# Patient Record
Sex: Female | Born: 1960 | Hispanic: No | Marital: Married | Smoking: Former smoker
Health system: Southern US, Community
[De-identification: ages and names within clinical notes are randomized; demographics above are authoritative.]

## PROBLEM LIST (undated history)

## (undated) ENCOUNTER — Ambulatory Visit

## (undated) DIAGNOSIS — F329 Major depressive disorder, single episode, unspecified: Secondary | ICD-10-CM

## (undated) DIAGNOSIS — E119 Type 2 diabetes mellitus without complications: Secondary | ICD-10-CM

## (undated) DIAGNOSIS — I219 Acute myocardial infarction, unspecified: Secondary | ICD-10-CM

## (undated) DIAGNOSIS — F419 Anxiety disorder, unspecified: Secondary | ICD-10-CM

## (undated) DIAGNOSIS — R011 Cardiac murmur, unspecified: Secondary | ICD-10-CM

## (undated) DIAGNOSIS — J45909 Unspecified asthma, uncomplicated: Secondary | ICD-10-CM

## (undated) DIAGNOSIS — Z87442 Personal history of urinary calculi: Secondary | ICD-10-CM

## (undated) DIAGNOSIS — I10 Essential (primary) hypertension: Secondary | ICD-10-CM

## (undated) DIAGNOSIS — K219 Gastro-esophageal reflux disease without esophagitis: Secondary | ICD-10-CM

## (undated) DIAGNOSIS — M1990 Unspecified osteoarthritis, unspecified site: Secondary | ICD-10-CM

## (undated) DIAGNOSIS — I350 Nonrheumatic aortic (valve) stenosis: Secondary | ICD-10-CM

## (undated) DIAGNOSIS — F32A Depression, unspecified: Secondary | ICD-10-CM

## (undated) DIAGNOSIS — J189 Pneumonia, unspecified organism: Secondary | ICD-10-CM

## (undated) DIAGNOSIS — R569 Unspecified convulsions: Secondary | ICD-10-CM

## (undated) HISTORY — PX: CHOLECYSTECTOMY: SHX55

---

## 1985-04-27 HISTORY — PX: TUBAL LIGATION: SHX77

## 2000-09-02 ENCOUNTER — Other Ambulatory Visit: Admission: RE | Admit: 2000-09-02 | Discharge: 2000-09-02 | Payer: Self-pay | Admitting: Family Medicine

## 2012-04-27 DIAGNOSIS — J189 Pneumonia, unspecified organism: Secondary | ICD-10-CM

## 2012-04-27 HISTORY — DX: Pneumonia, unspecified organism: J18.9

## 2013-04-27 DIAGNOSIS — I219 Acute myocardial infarction, unspecified: Secondary | ICD-10-CM

## 2013-04-27 HISTORY — DX: Acute myocardial infarction, unspecified: I21.9

## 2013-12-25 ENCOUNTER — Ambulatory Visit: Payer: Self-pay | Admitting: Family Medicine

## 2016-05-26 DIAGNOSIS — G629 Polyneuropathy, unspecified: Secondary | ICD-10-CM | POA: Insufficient documentation

## 2017-01-25 DIAGNOSIS — K5790 Diverticulosis of intestine, part unspecified, without perforation or abscess without bleeding: Secondary | ICD-10-CM | POA: Insufficient documentation

## 2017-01-25 DIAGNOSIS — H409 Unspecified glaucoma: Secondary | ICD-10-CM | POA: Insufficient documentation

## 2017-09-13 ENCOUNTER — Encounter
Admission: RE | Admit: 2017-09-13 | Discharge: 2017-09-13 | Disposition: A | Discharge status: Home / Self Care | Payer: Medicaid Other | Source: Ambulatory Visit | Attending: General Surgery | Admitting: General Surgery

## 2017-09-13 ENCOUNTER — Other Ambulatory Visit: Payer: Self-pay

## 2017-09-13 ENCOUNTER — Ambulatory Visit: Payer: Self-pay | Admitting: General Surgery

## 2017-09-13 DIAGNOSIS — I10 Essential (primary) hypertension: Secondary | ICD-10-CM | POA: Insufficient documentation

## 2017-09-13 DIAGNOSIS — R9431 Abnormal electrocardiogram [ECG] [EKG]: Secondary | ICD-10-CM | POA: Diagnosis not present

## 2017-09-13 DIAGNOSIS — Z01810 Encounter for preprocedural cardiovascular examination: Secondary | ICD-10-CM | POA: Insufficient documentation

## 2017-09-13 HISTORY — DX: Gastro-esophageal reflux disease without esophagitis: K21.9

## 2017-09-13 HISTORY — DX: Personal history of urinary calculi: Z87.442

## 2017-09-13 HISTORY — DX: Cardiac murmur, unspecified: R01.1

## 2017-09-13 HISTORY — DX: Essential (primary) hypertension: I10

## 2017-09-13 HISTORY — DX: Unspecified osteoarthritis, unspecified site: M19.90

## 2017-09-13 HISTORY — DX: Depression, unspecified: F32.A

## 2017-09-13 HISTORY — DX: Anxiety disorder, unspecified: F41.9

## 2017-09-13 HISTORY — DX: Unspecified convulsions: R56.9

## 2017-09-13 HISTORY — DX: Major depressive disorder, single episode, unspecified: F32.9

## 2017-09-13 HISTORY — DX: Acute myocardial infarction, unspecified: I21.9

## 2017-09-13 HISTORY — DX: Type 2 diabetes mellitus without complications: E11.9

## 2017-09-13 HISTORY — DX: Unspecified asthma, uncomplicated: J45.909

## 2017-09-13 HISTORY — DX: Pneumonia, unspecified organism: J18.9

## 2017-09-13 NOTE — H&P (Signed)
PATIENT PROFILE: Nancy [name] is a 57 y.o. female who presents to the Clinic for consultation at the request of [facility] for evaluation of hemorrhoids.  PCP: [facility], [facility], MD  HISTORY OF PRESENT ILLNESS: Nancy [name] reports having hemorrhoid since 1979. She said that in the last two years the have been more symptomatic. Patient complain of sever pain on peri anal area. Pain does not radiates to other part of the body. Pain is aggravated when sitting down and with bowel movement. Pain is improved with preparation H. Patient refers constipation. Constipation sometimes improved with glycerin suppository. Refers associated bleeding, itching, burning, and stool leakage. Patient refers last colonoscopy was 5 years ago with findings of diverticulosis as per patient. Next one expected to be 10 years after last one.   PROBLEM LIST: Problem List Date Reviewed: 04/01/2016  Noted  Hyperlipidemia, mixed 01/29/2016  Type 2 diabetes mellitus without complication, with long-term current use of insulin (CMS-HCC) 01/29/2016  Aortic stenosis, moderate 11/08/2015  Evidence of prior myocardial infarction on electrocardiogram 11/01/2015  Hypercholesteremia 10/31/2015  Overview  2017: Tchol 181, LDL 94, HDL 58   Essential hypertension 10/23/2015  Skin infection 10/23/2015  Situational anxiety 10/23/2015  Colon cancer screening 08/07/2013  Retinal hemorrhage, right eye 05/24/2013  Heart murmur - aortic stenosis Unknown  Overview  ECHO 2014: normal LV function, AS gradients of 40 and 23.    Chest pain, unspecified 10/14/2012  DM2 (diabetes mellitus, type 2) (CMS-HCC) Unknown  Glaucoma Unknown  Diverticulosis Unknown  Myocardial infarction (CMS-HCC) Unknown    GENERAL REVIEW OF SYSTEMS:   General ROS: negative for - chills, fatigue, fever, weight gain or weight loss Allergy and Immunology ROS: negative for - hives  Hematological and Lymphatic ROS: negative for - bleeding problems or  bruising, negative for palpable nodes Endocrine ROS: negative for - heat or cold intolerance, hair changes Respiratory ROS: negative for - cough, shortness of breath or wheezing Cardiovascular ROS: no chest pain or palpitations GI ROS: negative for nausea, vomiting, abdominal pain, diarrhea, Positive constipation Musculoskeletal ROS: positive for - joint swelling or muscle pain Neurological ROS: negative for - confusion, syncope Dermatological ROS: negative for pruritus and rash Psychiatric: negative for anxiety, depression, difficulty sleeping and memory loss  MEDICATIONS: Current Outpatient Medications  Medication Sig Dispense Refill  . amLODIPine (NORVASC) 5 MG tablet Take 5 mg by mouth once daily  . aspirin 81 MG EC tablet Take 81 mg by mouth once daily. Reported on 08/19/2015  . calcium carbonate-vitamin D3 (CALCIUM-VITAMIN D) 500 mg(1,250mg ) -400 unit tablet Take 1 tablet by mouth 2 (two) times daily with meals  . empagliflozin (JARDIANCE) 25 mg Tab Take 25 mg by mouth once daily. 90 tablet 3  . insulin GLARGINE (LANTUS SOLOSTAR, BASAGLAR KWIKPEN) pen injector (concentration 100 units/mL) Inject 60 Units subcutaneously 2 (two) times daily. (Patient taking differently: Inject 48 Units subcutaneously once daily ) 108 mL 3  . LACTOBACILLUS ACIDOPHILUS (PROBIOTIC ORAL) Take 1 tablet by mouth once daily  . losartan (COZAAR) 100 MG tablet Take 1 tablet (100 mg total) by mouth once daily. 90 tablet 3  . metoprolol tartrate (LOPRESSOR) 50 MG tablet Take 50 mg by mouth once daily  . multivitamin with minerals (HAIR,SKIN AND NAILS) tablet Take 2 tablets by mouth once daily.  . BD ULTRA-FINE NANO PEN NEEDLES 32 gauge x 5/32" Ndle USE AS DIRECTED WITH LEVEMIR FLEXTOUCH 270 each 3  . blood glucose diagnostic (ONETOUCH ULTRA TEST) test strip Use 3 (three) times daily. DX:  E11.9 300 each 3   No current facility-administered medications for this visit.   ALLERGIES: Liraglutide; Metformin;  Lisinopril; Ibuprofen; Levemir [insulin detemir]; and Statins-hmg-coa reductase inhibitors  PAST MEDICAL HISTORY: Past Medical History:  Diagnosis Date  . Allergic state  I buphine  . 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 Cataract cortical, senile 10/30/2014  . Diverticulosis  has had diverticulitis previously  . DM2 (diabetes mellitus, type 2) (CMS-HCC)  . Former smoker  quit 1987  . Glaucoma  . Heart murmur, unspecified  . HTN (hypertension)  . Hypercholesteremia 10/31/2015  . Myocardial infarction (CMS-HCC)  age 59 ("mild heart attack" per patient) in [place]   PAST SURGICAL HISTORY: Past Surgical History:  Procedure Laterality Date  . CHOLECYSTECTOMY  . COLONOSCOPY 08/08/2013  Procedure: COLORECTAL CANCER SCREENING; COLONOSCOPY ON INDIVIDUAL AT HIGH RISK; Surgeon: Benjie Karvonen, MD; Location: [facility]; Service: Gastroenterology;;  . TUBAL LIGATION    FAMILY HISTORY: Family History  Problem Relation Age of Onset  . Myocardial Infarction (Heart attack) Mother  . Cirrhosis Mother  . Alcohol abuse Mother  . Coronary Artery Disease (Blocked arteries around heart) Mother  . Glaucoma Mother  . High blood pressure (Hypertension) Father  . Aneurysm Sister  . Diabetes type II Brother  . Coronary Artery Disease (Blocked arteries around heart) Brother  . Diabetes type II Sister  . High blood pressure (Hypertension) Sister  . High blood pressure (Hypertension) Sister  . Diabetes type II Sister  . Diabetes type II Sister  . High blood pressure (Hypertension) Sister  . Stroke Maternal Grandfather  . Stroke Paternal Grandmother  . Stroke Paternal Grandfather  . Coronary Artery Disease (Blocked arteries around heart) Brother  . Diabetes type II Brother  . High blood pressure (Hypertension) Brother  . Diabetes type II Sister  . High blood pressure (Hypertension) Sister  . Obesity Sister  . Diabetes type II Sister  . Diabetes type II Sister  . Stroke  Maternal Grandmother  . Thyroid disease Daughter    SOCIAL HISTORY: Social History   Socioeconomic History  . Marital status: Married  Spouse name: Not on file  . Number of children: Not on file  . Years of education: Not on file  . Highest education level: Not on file  Occupational History  . Not on file  Social Needs  . Financial resource strain: Not on file  . Food insecurity:  Worry: Not on file  Inability: Not on file  . Transportation needs:  Medical: Not on file  Non-medical: Not on file  Tobacco Use  . Smoking status: Former Smoker  Packs/day: 1.00  Years: 11.00  Pack years: 11.00  Types: Cigarettes  Last attempt to quit: 05/07/1985  Years since quitting: 32.3  . Smokeless tobacco: Never Used  Substance and Sexual Activity  . Alcohol use: No  Alcohol/week: 0.0 oz  Comment: 1 drink per month  . Drug use: Yes  Frequency: 7.0 times per week  Types: Marijuana  Comment: for control of glocoma  . Sexual activity: Yes  Partners: Male  Birth control/protection: Spermicide, Surgical, None  Other Topics Concern  . Would you please tell us about the people who live in your home, your pets, or anything else important to your social life? No  Social History Narrative  Married in 1978. Four kids, five grandkids. 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 Exercise: will work on. Wears seatbelt, sunscreen. Dentist: yes. Calcium in diet: yes. Religious beliefs affecting  health care: no.   Advanced Directives: husband knows her wishes.   PHYSICAL EXAM: Vitals:  09/10/17 0801  BP: 138/89  Pulse: 70  Temp: 36.1 C (96.9 F)   Body mass index is 31.8 kg/m. Weight: 83.4 kg (183 lb 12.8 oz)   GENERAL: Alert, active, oriented x3  HEENT: Pupils equal reactive to light. Extraocular movements are intact. Sclera clear. Palpebral conjunctiva normal red color.Pharynx clear.  NECK: Supple with no palpable mass and no adenopathy.  LUNGS: Sound clear with no rales rhonchi or  wheezes.  HEART: Regular rhythm S1 and S2 without murmur.  ABDOMEN: Soft and depressible, nontender with no palpable mass, no hepatomegaly.  RECTAL: adequate rectal tone. No masses, palpable hemorrhoids. No fissures. See anoscopy report.   EXTREMITIES: Well-developed well-nourished symmetrical with no dependent edema.  NEUROLOGICAL: Awake alert oriented, facial expression symmetrical, moving all extremities.  REVIEW OF DATA: I have reviewed the following data today: No visits with results within 3 Month(s) from this visit.  Latest known visit with results is:  Office Visit on 01/29/2016  Component Date Value  . Auto Result 01/29/2016 Yes  . Hemoglobin A1C 01/29/2016 8.6*  . Average Blood Glucose (C* 01/29/2016 207   Old record from last visit at the [facility] was reviewed.   ASSESSMENT: Ms. Prum is a 57 y.o. female presenting for consultation for Hemorrhoids.   Patient was found on physical exam with finding of internal and external hemorrhoids. Patient oriented about the diagnosis of hemorrhoids, its function and anatomy. The different treatment alternatives were discussed (conservative management, office procedures and surgical treatment). Patient was oriented of why it should be started with conservative management (fiber supplement, water intake). Patient was oriented that the hemorrhoids are a matter of quality of life. Patient refers that her quality of life right now is being affected significantly and she will like to proceed directly to surgical management since the pain and the bleeding are not letting her work around the house well. Patient oriented multiple times about the difficult recovery of the surgery, pain and other risk of complication such as: incontinence, stricture, fistulas, infection, bleeding, among others.   PLAN: 1. Internal and External Hemorrhoidectomy (37366) 2. CBC, CMP 3. Internal Medicine clearance 4. Stop taking the aspirin 5  days before surgery  Patient and her husband verbalized understanding, all questions were answered, and were agreeable with the plan outlined above.   Carolan Shiver, MD  Electronically signed by Carolan Shiver, MD

## 2017-09-13 NOTE — Pre-Procedure Instructions (Signed)
CALLED SHERRY AT DR CINTRONS OFFICE AND INFORMED HER OF PT NEEDING CLEARANCE-FAXED CLEARANCE TO DR CINTRONS OFFICE AND TO DR Emelda Brothers OFFICE

## 2017-09-13 NOTE — H&P (View-Only) (Signed)
PATIENT PROFILE: Nancy [name] is a 57 y.o. female who presents to the Clinic for consultation at the request of [facility] for evaluation of hemorrhoids.  PCP: [facility], [facility], MD  HISTORY OF PRESENT ILLNESS: Ms. Nancy [name] reports having hemorrhoid since 1979. She said that in the last two years the have been more symptomatic. Patient complain of sever pain on peri anal area. Pain does not radiates to other part of the body. Pain is aggravated when sitting down and with bowel movement. Pain is improved with preparation H. Patient refers constipation. Constipation sometimes improved with glycerin suppository. Refers associated bleeding, itching, burning, and stool leakage. Patient refers last colonoscopy was 5 years ago with findings of diverticulosis as per patient. Next one expected to be 10 years after last one.   PROBLEM LIST: Problem List Date Reviewed: 04/01/2016  Noted  Hyperlipidemia, mixed 01/29/2016  Type 2 diabetes mellitus without complication, with long-term current use of insulin (CMS-HCC) 01/29/2016  Aortic stenosis, moderate 11/08/2015  Evidence of prior myocardial infarction on electrocardiogram 11/01/2015  Hypercholesteremia 10/31/2015  Overview  2017: Tchol 181, LDL 94, HDL 58   Essential hypertension 10/23/2015  Skin infection 10/23/2015  Situational anxiety 10/23/2015  Colon cancer screening 08/07/2013  Retinal hemorrhage, right eye 05/24/2013  Heart murmur - aortic stenosis Unknown  Overview  ECHO 2014: normal LV function, AS gradients of 40 and 23.    Chest pain, unspecified 10/14/2012  DM2 (diabetes mellitus, type 2) (CMS-HCC) Unknown  Glaucoma Unknown  Diverticulosis Unknown  Myocardial infarction (CMS-HCC) Unknown    GENERAL REVIEW OF SYSTEMS:   General ROS: negative for - chills, fatigue, fever, weight gain or weight loss Allergy and Immunology ROS: negative for - hives  Hematological and Lymphatic ROS: negative for - bleeding problems or  bruising, negative for palpable nodes Endocrine ROS: negative for - heat or cold intolerance, hair changes Respiratory ROS: negative for - cough, shortness of breath or wheezing Cardiovascular ROS: no chest pain or palpitations GI ROS: negative for nausea, vomiting, abdominal pain, diarrhea, Positive constipation Musculoskeletal ROS: positive for - joint swelling or muscle pain Neurological ROS: negative for - confusion, syncope Dermatological ROS: negative for pruritus and rash Psychiatric: negative for anxiety, depression, difficulty sleeping and memory loss  MEDICATIONS: Current Outpatient Medications  Medication Sig Dispense Refill  . amLODIPine (NORVASC) 5 MG tablet Take 5 mg by mouth once daily  . aspirin 81 MG EC tablet Take 81 mg by mouth once daily. Reported on 08/19/2015  . calcium carbonate-vitamin D3 (CALCIUM-VITAMIN D) 500 mg(1,250mg) -400 unit tablet Take 1 tablet by mouth 2 (two) times daily with meals  . empagliflozin (JARDIANCE) 25 mg Tab Take 25 mg by mouth once daily. 90 tablet 3  . insulin GLARGINE (LANTUS SOLOSTAR, BASAGLAR KWIKPEN) pen injector (concentration 100 units/mL) Inject 60 Units subcutaneously 2 (two) times daily. (Patient taking differently: Inject 48 Units subcutaneously once daily ) 108 mL 3  . LACTOBACILLUS ACIDOPHILUS (PROBIOTIC ORAL) Take 1 tablet by mouth once daily  . losartan (COZAAR) 100 MG tablet Take 1 tablet (100 mg total) by mouth once daily. 90 tablet 3  . metoprolol tartrate (LOPRESSOR) 50 MG tablet Take 50 mg by mouth once daily  . multivitamin with minerals (HAIR,SKIN AND NAILS) tablet Take 2 tablets by mouth once daily.  . BD ULTRA-FINE NANO PEN NEEDLES 32 gauge x 5/32" Ndle USE AS DIRECTED WITH LEVEMIR FLEXTOUCH 270 each 3  . blood glucose diagnostic (ONETOUCH ULTRA TEST) test strip Use 3 (three) times daily. DX:   E11.9 300 each 3   No current facility-administered medications for this visit.   ALLERGIES: Liraglutide; Metformin;  Lisinopril; Ibuprofen; Levemir [insulin detemir]; and Statins-hmg-coa reductase inhibitors  PAST MEDICAL HISTORY: Past Medical History:  Diagnosis Date  . Allergic state  I buphine  . 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 Cataract cortical, senile 10/30/2014  . Diverticulosis  has had diverticulitis previously  . DM2 (diabetes mellitus, type 2) (CMS-HCC)  . Former smoker  quit 1987  . Glaucoma  . Heart murmur, unspecified  . HTN (hypertension)  . Hypercholesteremia 10/31/2015  . Myocardial infarction (CMS-HCC)  age 27 ("mild heart attack" per patient) in [place]   PAST SURGICAL HISTORY: Past Surgical History:  Procedure Laterality Date  . CHOLECYSTECTOMY  . COLONOSCOPY 08/08/2013  Procedure: COLORECTAL CANCER SCREENING; COLONOSCOPY ON INDIVIDUAL AT HIGH RISK; Surgeon: Joanne A P Wilson, MD; Location: [facility]; Service: Gastroenterology;;  . TUBAL LIGATION    FAMILY HISTORY: Family History  Problem Relation Age of Onset  . Myocardial Infarction (Heart attack) Mother  . Cirrhosis Mother  . Alcohol abuse Mother  . Coronary Artery Disease (Blocked arteries around heart) Mother  . Glaucoma Mother  . High blood pressure (Hypertension) Father  . Aneurysm Sister  . Diabetes type II Brother  . Coronary Artery Disease (Blocked arteries around heart) Brother  . Diabetes type II Sister  . High blood pressure (Hypertension) Sister  . High blood pressure (Hypertension) Sister  . Diabetes type II Sister  . Diabetes type II Sister  . High blood pressure (Hypertension) Sister  . Stroke Maternal Grandfather  . Stroke Paternal Grandmother  . Stroke Paternal Grandfather  . Coronary Artery Disease (Blocked arteries around heart) Brother  . Diabetes type II Brother  . High blood pressure (Hypertension) Brother  . Diabetes type II Sister  . High blood pressure (Hypertension) Sister  . Obesity Sister  . Diabetes type II Sister  . Diabetes type II Sister  . Stroke  Maternal Grandmother  . Thyroid disease Daughter    SOCIAL HISTORY: Social History   Socioeconomic History  . Marital status: Married  Spouse name: Not on file  . Number of children: Not on file  . Years of education: Not on file  . Highest education level: Not on file  Occupational History  . Not on file  Social Needs  . Financial resource strain: Not on file  . Food insecurity:  Worry: Not on file  Inability: Not on file  . Transportation needs:  Medical: Not on file  Non-medical: Not on file  Tobacco Use  . Smoking status: Former Smoker  Packs/day: 1.00  Years: 11.00  Pack years: 11.00  Types: Cigarettes  Last attempt to quit: 05/07/1985  Years since quitting: 32.3  . Smokeless tobacco: Never Used  Substance and Sexual Activity  . Alcohol use: No  Alcohol/week: 0.0 oz  Comment: 1 drink per month  . Drug use: Yes  Frequency: 7.0 times per week  Types: Marijuana  Comment: for control of glocoma  . Sexual activity: Yes  Partners: Male  Birth control/protection: Spermicide, Surgical, None  Other Topics Concern  . Would you please tell us about the people who live in your home, your pets, or anything else important to your social life? No  Social History Narrative  Married in 1978. Four kids, five grandkids. 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 Exercise: will work on. Wears seatbelt, sunscreen. Dentist: yes. Calcium in diet: yes. Religious beliefs affecting   health care: no.   Advanced Directives: husband knows her wishes.   PHYSICAL EXAM: Vitals:  09/10/17 0801  BP: 138/89  Pulse: 70  Temp: 36.1 C (96.9 F)   Body mass index is 31.8 kg/m. Weight: 83.4 kg (183 lb 12.8 oz)   GENERAL: Alert, active, oriented x3  HEENT: Pupils equal reactive to light. Extraocular movements are intact. Sclera clear. Palpebral conjunctiva normal red color.Pharynx clear.  NECK: Supple with no palpable mass and no adenopathy.  LUNGS: Sound clear with no rales rhonchi or  wheezes.  HEART: Regular rhythm S1 and S2 without murmur.  ABDOMEN: Soft and depressible, nontender with no palpable mass, no hepatomegaly.  RECTAL: adequate rectal tone. No masses, palpable hemorrhoids. No fissures. See anoscopy report.   EXTREMITIES: Well-developed well-nourished symmetrical with no dependent edema.  NEUROLOGICAL: Awake alert oriented, facial expression symmetrical, moving all extremities.  REVIEW OF DATA: I have reviewed the following data today: No visits with results within 3 Month(s) from this visit.  Latest known visit with results is:  Office Visit on 01/29/2016  Component Date Value  . Auto Result 01/29/2016 Yes  . Hemoglobin A1C 01/29/2016 8.6*  . Average Blood Glucose (C* 01/29/2016 207   Old record from last visit at the [facility] was reviewed.   ASSESSMENT: Ms. Behar is a 57 y.o. female presenting for consultation for Hemorrhoids.   Patient was found on physical exam with finding of internal and external hemorrhoids. Patient oriented about the diagnosis of hemorrhoids, its function and anatomy. The different treatment alternatives were discussed (conservative management, office procedures and surgical treatment). Patient was oriented of why it should be started with conservative management (fiber supplement, water intake). Patient was oriented that the hemorrhoids are a matter of quality of life. Patient refers that her quality of life right now is being affected significantly and she will like to proceed directly to surgical management since the pain and the bleeding are not letting her work around the house well. Patient oriented multiple times about the difficult recovery of the surgery, pain and other risk of complication such as: incontinence, stricture, fistulas, infection, bleeding, among others.   PLAN: 1. Internal and External Hemorrhoidectomy (46260) 2. CBC, CMP 3. Internal Medicine clearance 4. Stop taking the aspirin 5  days before surgery  Patient and her husband verbalized understanding, all questions were answered, and were agreeable with the plan outlined above.   Herman Mell Cintron-Diaz, MD  Electronically signed by Beckhem Isadore Cintron-Diaz, MD   

## 2017-09-13 NOTE — Pre-Procedure Instructions (Signed)
CALLED DR Maisie Fus REGARDING ABNORMAL EKG- DR Maisie Fus WANTS PTS CARDIOLOGIST TO CLEAR PT PRIOR TO SURGERY

## 2017-09-13 NOTE — Patient Instructions (Addendum)
Your procedure is scheduled on: 09-16-17 THURSDAY Report to Same Day Surgery 2nd floor medical mall [facility] Entrance-take elevator on left to 2nd floor.  Check in with surgery information desk.) To find out your arrival time please call [phone] between 1PM - 3PM on 09-15-17 [facility]  Remember: Instructions that are not followed completely may result in serious medical risk, up to and including death, or upon the discretion of your surgeon and anesthesiologist your surgery may need to be rescheduled.    _x___ 1. Do not eat food after midnight the night before your procedure. NO GUM OR CANDY AFTER MIDNIGHT.  You may drink WATER up to 2 hours before you are scheduled to arrive at the hospital for your procedure.  Do not drink WATER within 2 hours of your scheduled arrival to the hospital.  Type 1 and type 2 diabetics should only drink water.      __x__ 2. No Alcohol for 24 hours before or after surgery.   __x__3. No Smoking or e-cigarettes for 24 prior to surgery.  Do not use any chewable tobacco products for at least 6 hour prior to surgery   ____  4. Bring all medications with you on the day of surgery if instructed.    __x__ 5. Notify your doctor if there is any change in your medical condition     (cold, fever, infections).    x___6. On the morning of surgery brush your teeth with toothpaste and water.  You may rinse your mouth with mouth wash if you wish.  Do not swallow any toothpaste or mouthwash.   Do not wear jewelry, make-up, hairpins, clips or nail polish.  Do not wear lotions, powders, or perfumes. You may wear deodorant.  Do not shave 48 hours prior to surgery. Men may shave face and neck.  Do not bring valuables to the hospital.    [facility] is not responsible for any belongings or valuables.               Contacts, dentures or bridgework may not be worn into surgery.  Leave your suitcase in the car. After surgery it may be brought to your room.  For patients  admitted to the hospital, discharge time is determined by your treatment team.  _  Patients discharged the day of surgery will not be allowed to drive home.  You will need someone to drive you home and stay with you the night of your procedure.    Please read over the following fact sheets that you were given:   [facility] Preparing for Surgery and or MRSA Information   _x___ TAKE THE FOLLOWING MEDICATION THE MORNING OF SURGERY WITH A SMALL SIP OF WATER. These include:  1. AMLODIPINE  2. METOPROLOL  3.  4.  5.  6.  ____Fleets enema or Magnesium Citrate as directed.   ____ Use CHG Soap or sage wipes as directed on instruction sheet   _X___ Use inhalers on the day of surgery and bring to hospital day of surgery-USE YOUR ALBUTEROL INHALER AM OF SURGERY AND BRING TO HOSPITAL  ____ Stop Metformin and Janumet 2 days prior to surgery    _X___ Take 1/2 of usual insulin dose the night before surgery and none on the morning surgery-TAKE HALF OF YOUR LANTUS ON Wednesday NIGHT AND NO INSULIN AM OF SURGERY  _x___ Follow recommendations from Cardiologist, Pulmonologist or PCP regarding stopping Aspirin, Coumadin, Plavix ,Eliquis, Effient, or Pradaxa, and Pletal-PT STOPPED ASPIRIN ON Saturday, 09-11-17  PER DR CINTRON-DIAZ  X____Stop Anti-inflammatories such as Advil, Aleve, Ibuprofen, Motrin, Naproxen, Naprosyn, Goodies powders or aspirin products NOW-OK to take Tylenol #3 IF NEEDED   _x___ Stop supplements until after surgery-STOP CINNAMON, GARCINIA-CAMBOGIA, AND TURMERIC NOW-MAY RESUME AFTER SURGERY   ____ Bring C-Pap to the hospital.

## 2017-09-14 NOTE — Pre-Procedure Instructions (Addendum)
Spoke with amy at dr Emelda Brothers office informing her that I my fax is not going thru-she gave me the correct #. I told her that I was faxing this over and I gave Amy pt's name so she would be looking for fax.  Faxed clearance request with confirmation received

## 2017-09-15 NOTE — Pre-Procedure Instructions (Signed)
CALLED AND SPOKE WITH DR [facility] NURSE.  SHE STATES THEY DID RECEIVE THE CLEARANCE REQUEST.  SHE STATES DR Lucianne Muss HAS BEEN IN PROCEDURES ALL MORNING AND THAT SHE WILL TALK WITH HIM REGARDING THIS CLEARANCE. SHE STATES THAT DR Lucianne Muss WANTED PT LAST MAY TO HAVE AN ECHO DONE AND SHE CANCELLED THE APPT SO SHE THINKS THAT DR Lucianne Muss PROBABLY WILL NOT CLEAR PT. NURSE TO CALL ME BACK WHEN SHE GETS AN ANSWER FROM DR Lucianne Muss. I CALLED SHERRI AT DR CINTRONS OFFICE AND LEFT HER A MESSAGE REGARDING THIS.

## 2017-09-22 NOTE — Pre-Procedure Instructions (Signed)
Progress Notes - documented in this encounter  Eldred Manges, MD - 09/16/2017 8:30 AM EDT Formatting of this note might be different from the original.   [facility] Cardiology, [facility]  Date of Service: 09/16/2017  Return Clinic Note  PCP: Referring Provider:  Tommie Raymond, MD [address] [facility] [place] [address] Phone: [phone] Fax: [phone] Eldred Manges, MD [address] [place], [place] [address] Phone: [phone] Fax: [phone]   Assessment and Plan:   [name] is a 57 y.o. 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 Evie Lacks.  #1: Aortic stenosis She continues to remain asymptomatic from aortic stenosis point of view at this point of time with no history of exertional chest pain, shortness of breath or any dizziness or syncope. She was diagnosed to have aortic stenosis when she was evaluated for muscular pain in her chest due to sprain and was found to have a cardiac murmur. Echocardiographic evaluation done at that time showed moderate aortic stenosis with normal LV systolic function and moderate left ventricular hypertrophy.  During the last visit about a year back she was advised to get an echocardiographic evaluation as she has not had echocardiographic evaluation in the last 5 years or so. She is also being planned for surgery for hemorrhoids which will require general anesthesia and needs to get reevaluation of her cardiac condition done which is required at this time for risk stratify from noncardiac surgery point of view.  #2: Hypertension She's currently on amlodipine losartan and metoprolol succinate and with that her blood pressure is under good control. I did  not make any changes in her medications at this point.  #3: Hyperlipidemia Her total cholesterol in 2016 was 181 and LDL cholesterol was at 94 with HDL cholesterol of 58. She has been unable to tolerate statins in the past.  . No follow-ups on file. Orders Placed This Encounter  Procedures  . Echocardiogram W Colorflow Spectral Doppler  History of moderate stenosis, last echo more than 5 years back at [facility].  Standing Status: Future  Number of Occurrences: 1  Standing Expiration Date: 09/17/2018  Order Specific Question: Performed at  Answer: [facility]  Order Specific Question: Reason for Exam:  Answer: Aortic stenosis  Order Specific Question: Is the patient pregnant?  Answer: No   Subjective:   Reason for Visit: Aortic stenosis  History of Present Illness: Ms. Nancy [name] is a 57 y.o. 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 Evie Lacks. She denies any history of any exertional symptoms like shortness of breath on exertion or angina on exertion. She also denies any history of exertional syncope or any history of dizziness. She denies any history of palpitations. She denies any history of orthopnea or paroxysmal nocturnal dyspnea or any body swellings. She denies any history suggestive of any cerebrovascular event or any history of bleeding event.  Cardiovascular History and relevant Past Medical History: Hypertension, diabetes mellitus, moderate aortic stenosis due to degenerative aortic valve disease.  I have independently reviewed of all the diagnostic studies. I have reviewed the old medical records from [facility] prior to this  clinic visit.   Cardiovascular Studies Date Comments  ECG Sep 01, 2016 Normal sinus rhythm, nonspecific IVCD with QRS duration of 126 ms.  Echo July 2017([facility]) NORMAL LEFT  VENTRICULAR SYSTOLIC FUNCTION WITH MODERATE LVH NORMAL RIGHT VENTRICULAR SYSTOLIC FUNCTION VALVULAR REGURGITATION: TRIVIAL AR, TRIVIAL MR, TRIVIAL PR, TRIVIAL TR VALVULAR STENOSIS: MODERATE AS(peak velocity 3.8 m/s)  Stress test/imaging  Cardiac catheterization  CYP2C19 Genotype  Electrophysiology  Cardiovascular Surgery  Peripheral Vascular Studies  Past medical history was reviewed in EPIC.  Pertinent cardiac medications:  Current Outpatient Medications  Medication Sig Dispense Refill  . acetaminophen-codeine (TYLENOL #3) 300-30 mg per tablet TK 1 TO 2 TS PO HS PRN P 0  . albuterol (PROAIR HFA) 90 mcg/actuation inhaler Inhale 2 puffs every six (6) hours as needed for wheezing. 1 Inhaler 0  . amLODIPine (NORVASC) 10 MG tablet TK 1 T PO D FOR HIGH BP 5  . aspirin (ECOTRIN) 81 MG tablet Take 81 mg by mouth daily.  . blood sugar diagnostic (GLUCOSE BLOOD) Strp nano needles UAD 100 each 1  . budesonide-formoterol (SYMBICORT) 160-4.5 mcg/actuation inhaler Inhale 2 puffs Two (2) times a day. 1 Inhaler 0  . calcium carbonate-vitamin D3 500 mg(1,250mg ) -400 unit Tab Take 1 tablet by mouth.  . cinnamon bark, bulk, Powd Take 2 capsules by mouth.  . clonazePAM (KLONOPIN) 0.25 MG disintegrating tablet Take 0.25 mg by mouth two (2) times a day as needed for anxiety.  . diclofenac sodium (VOLTAREN) 1 % gel Apply 2 g topically Four (4) times a day.  . empagliflozin (JARDIANCE) 10 mg Tab Take 1 tablet by mouth daily at 0600. 90 tablet 0  . gabapentin (NEURONTIN) 300 MG capsule Take 1 capsule (300 mg total) by mouth nightly. 270 capsule 0  . insulin glargine (LANTUS) 100 unit/mL injection Inject 0.2 mL (20 Units total) under the skin nightly. 10 mL 0  . LANTUS SOLOSTAR U-100 INSULIN 100 unit/mL (3 mL) injection pen INJECT 40 UNITS UNDER THE SKIN NIGHTLY 4 pen PRN  . losartan (COZAAR) 100 MG tablet Take 1 tablet (100 mg total) by mouth daily. 90 tablet 0  . methocarbamol (ROBAXIN) 500 MG tablet  Take 500 mg by mouth every six (6) hours as needed.  . metoprolol succinate (TOPROL-XL) 50 MG 24 hr tablet TK 1 T PO D FOR HIGH BP 2  . metoprolol tartrate (LOPRESSOR) 50 MG tablet Take 50 mg by mouth.  . multivitamin with minerals (HAIR,SKIN AND NAILS) tablet Take 2 tablets by mouth.  . pen needle, diabetic (PEN NEEDLE) 31 gauge x 5/16" Ndle One step ultra mini 100 each 1  . sAXagliptin (ONGLYZA) 2.5 mg Tab Take 1 tablet (2.5 mg total) by mouth daily. 90 tablet 0  . turmeric, bulk, 100 % Powd Take 1 capsule by mouth.   No current facility-administered medications for this visit.   Allergies: Allergies  Allergen Reactions  . Liraglutide Dermatitis and Other (See Comments)  Almost went blind  . Ibuprofen  Other reaction(s): rectal bleeding. takes aleve w/o problem GI Bleed  . Insulin Detemir Other (See Comments)  Injection site reaction, bruise, knot up  . Insulins Other (See Comments)  Vision loss  . Lisinopril Other (See Comments)  Cough  . Metformin Other (See Comments)  Yeast infection  . Statins-Hmg-Coa Reductase Inhibitors Other (See Comments)  Leg pain and cramping   Social History: reports that she has quit smoking. She has never used smokeless tobacco. Family History: family history includes Heart disease in her brother  and mother.  Review of Systems The balance of 10 systems is negative except as noted in HPI.   Objective:  Physical Exam BP 150/86 (BP Site: R Arm, BP Position: Sitting, BP Cuff Size: Medium)  Pulse 80  Temp 37.2 C (99 F) (Oral)  Ht 160.8 cm (5' 3.3")  Wt 82.3 kg (181 lb 6.4 oz)  SpO2 96%  BMI 31.83 kg/m  BP Readings from Last 3 Encounters:  09/16/17 150/86  01/25/17 140/90  09/01/16 146/84   Wt Readings from Last 3 Encounters:  09/16/17 82.3 kg (181 lb 6.4 oz)  01/25/17 85.8 kg (189 lb 3.2 oz)  09/01/16 83.6 kg (184 lb 6.4 oz)   GA: Alert, no distress.  Eyes: Intact, sclerae anicteric.  Ears, nose, mouth: Moist mucous membrane    Cardiovascular: No carotid bruit, JVD normal at 90 degree, grade 4/6 crescendo decrescendo systolic murmur in aortic area.  No edema bilaterally.  Respiratory: CTAB bilaterally with normal WOB.  Abdomen: Normal bowel sound, soft, NTND.  Skin: Warm, well perfused.  Neurologic: No focal deficits.   Most recent labs  No results found for: NA, K, CL, CO2, BUN, CREATININE, CA, MG No results found for: HGB, MCV, PLT Lab Results  Component Value Date  A1C 9.1 (A) 01/25/2017     Electronically signed by Eldred Manges, MD at 09/16/2017 9:24 AM EDT     Plan of Treatment - documented as of this encounter  Not on file    Imaging Results - documented in this encounter   Echocardiogram W Colorflow Spectral Doppler (09/16/2017 9:52 AM EDT) Echocardiogram W Colorflow Spectral Doppler (09/16/2017 9:52 AM EDT)  Component Value Ref Range Performed At Pathologist Signature  LV Diastolic Diameter PLAX 3.9 cm [facility]   LV Systolic Diameter PLAX 2.6 cm [facility]   IVS Diastolic Thickness 2.0 cm [facility]   LVPW Diastolic Thickness 1.9 cm [facility]   LVOT Diameter 2.0 cm [facility]   Aortic Root Diameter 3.0 cm [facility]   LA Systolic Diameter LX 5.2 cm [facility]   LA Area 4C View 22.0 cm2 [facility]   AV Peak Velocity 4.5 m/s [facility]   AV Peak Gradient 81.0  [facility]   AV Mean Gradient 44.7 mmHg [facility]   AV Velocity Time Integral 97.1 cm [facility]   LVOT Peak Velocity 0.0 m/s [facility]   LVOT Peak Gradient 4.0 mmHg [facility]   LVOT Velocity Time Integral 27.2 cm [facility]   Mitral E Point Velocity 0.0 m/s [facility]   Mitral A Point Velocity 0.0 m/s [facility]   Mitral E to A Ratio 0.6  [facility]   PV Peak Velocity 1.1 m/s [facility]   LV E' Lateral Velocity 0.1 m/s [facility]   PV peak gradient 5.00 mmHg [facility]   Est PA Systolic Pressure 20-25  [facility]    Echocardiogram W Colorflow  Spectral Doppler (09/16/2017 9:52 AM EDT)  Specimen     Echocardiogram W Colorflow Spectral Doppler (09/16/2017 9:52 AM EDT)  Narrative Performed At  This result has an attachment that is not available.   Normal left ventricular systolic function, ejection fraction 65 to 70%  Normal right ventricular systolic function  Left ventricular hypertrophy - severe  Aortic stenosis - severe  Diastolic dysfunction - grade II (elevated filling pressures)  Dilated left atrium - moderate    [facility]    Echocardiogram W Colorflow Spectral Doppler (09/16/2017 9:52 AM EDT)  Performing Organization Address City/State/Zipcode Phone Number  [facility]          Visit Diagnoses - documented  in this encounter  Diagnosis  Aortic valve stenosis, etiology of cardiac valve disease unspecified - Primary    Discontinued Medications - documented as of this encounter  Medication Sig Discontinue Reason Start Date End Date  amLODIPine (NORVASC) 5 MG tablet  Take 1 tablet (5 mg total) by mouth daily.  02/04/2017 09/16/2017  metoprolol succinate (TOPROL-XL) 25 MG 24 hr tablet  Take 1 tablet (25 mg total) by mouth daily.  02/04/2017 09/16/2017   Historical Medications - added in this encounter  This list may reflect changes made after this encounter.  Medication Sig Dispensed Refills Start Date End Date  metoprolol succinate (TOPROL-XL) 50 MG 24 hr tablet  TK 1 T PO D FOR HIGH BP  2 09/05/2017   calcium carbonate-vitamin D3 500 mg(1,250mg ) -400 unit Tab  Take 1 tablet by mouth.  0    metoprolol tartrate (LOPRESSOR) 50 MG tablet  Take 50 mg by mouth.  0    multivitamin with minerals (HAIR,SKIN AND NAILS) tablet  Take 2 tablets by mouth.  0    amLODIPine (NORVASC) 10 MG tablet  TK 1 T PO D FOR HIGH BP  5 08/29/2017   turmeric, bulk, 100 % Powd  Take 1 capsule by mouth.  0    cinnamon bark, bulk, Powd  Take 2 capsules by mouth.  0     Images Patient  Contacts   Contact Name Contact Address Communication Relationship to Patient  Adelia Baptista Unknown [phone] [facility]) Spouse, Emergency Contact  Annice Pih Bowermaster Unknown [phone] [facility]) Daughter, Emergency Contact   Document Information  Primary Care Provider Other Service Providers Document Coverage Dates  Danton Clap MD (May 23, 2019May 23, 2019 - Present) [phone] (Work) [phone] [facility] [address] [facility] [place], [place] [address]   May 23, 2019May 23, 2019   Orland Penman  [facility] [address] [place], [place] [address]   Encounter Providers Encounter Date  Eldred Manges MD (Attending) [phone] (Work) [phone] (Fax) [address] [place], [place] [address]  May 23, 2019May 23, 2019

## 2017-10-06 ENCOUNTER — Encounter: Payer: Self-pay | Admitting: *Deleted

## 2017-10-06 MED ORDER — METRONIDAZOLE IN NACL 5-0.79 MG/ML-% IV SOLN
500.0000 mg | INTRAVENOUS | Status: AC
  Administered 2017-10-07: 500 mg via INTRAVENOUS
  Filled 2017-10-06: qty 100

## 2017-10-06 MED ORDER — SODIUM CHLORIDE 0.9 % IV SOLN
INTRAVENOUS | Status: DC
  Administered 2017-10-07: 08:00:00 via INTRAVENOUS

## 2017-10-06 MED ORDER — CIPROFLOXACIN IN D5W 400 MG/200ML IV SOLN
400.0000 mg | INTRAVENOUS | Status: AC
  Administered 2017-10-07: 400 mg via INTRAVENOUS

## 2017-10-06 MED ORDER — FAMOTIDINE 20 MG PO TABS
20.0000 mg | ORAL_TABLET | Freq: Once | ORAL | Status: AC
  Administered 2017-10-07: 20 mg via ORAL

## 2017-10-06 NOTE — Pre-Procedure Instructions (Signed)
CARDIAC CLEARANCE ON CHART FROM DR Phillis Knack RISK

## 2017-10-06 NOTE — Pre-Procedure Instructions (Signed)
EchocardiogramW Colorflow Spectral Doppler5/28/2019 [facility] Component Name Value Ref Range  LV Diastolic Diameter PLAX 3.9 cm  LV Systolic Diameter PLAX 2.6 cm  IVS Diastolic Thickness 2.0 cm  LVPW Diastolic Thickness 1.9 cm  LVOT Diameter 2.0 cm  Aortic Root Diameter 3.0 cm  LA Systolic Diameter LX 5.2 cm  LA Area 4C View 22.0 cm2  AV Peak Velocity 4.5 m/s  AV Peak Gradient 81.0   AV Mean Gradient 44.7 mmHg  AV Velocity Time Integral 97.1 cm  LVOT Peak Velocity 0.0 m/s  LVOT Peak Gradient 4.0 mmHg  LVOT Velocity Time Integral 27.2 cm  Mitral E Point Velocity 0.0 m/s  Mitral A Point Velocity 0.0 m/s  Mitral E to A Ratio 0.6   PV Peak Velocity 1.1 m/s  LV E' Lateral Velocity 0.1 m/s  PV peak gradient 5.00 mmHg  Est PA Systolic Pressure 20 - 25   Other Result Information  This result has an attachment that is not available.  Result Narrative   Normal left ventricular systolic function, ejection fraction 65 to 70%  Normal right ventricular systolic function  Left ventricular hypertrophy - severe  Aortic stenosis - severe  Diastolic dysfunction - grade II (elevated filling pressures)  Dilated left atrium - moderate  Status Results Details   Encounter Summary

## 2017-10-07 ENCOUNTER — Ambulatory Visit: Payer: Medicaid Other | Admitting: Anesthesiology

## 2017-10-07 ENCOUNTER — Encounter
Admission: RE | Disposition: A | Discharge status: Home / Self Care | Payer: Self-pay | Source: Ambulatory Visit | Attending: General Surgery

## 2017-10-07 ENCOUNTER — Ambulatory Visit
Admission: RE | Admit: 2017-10-07 | Discharge: 2017-10-07 | Disposition: A | Discharge status: Home / Self Care | Payer: Medicaid Other | Source: Ambulatory Visit | Attending: General Surgery | Admitting: General Surgery

## 2017-10-07 ENCOUNTER — Encounter: Payer: Self-pay | Admitting: Anesthesiology

## 2017-10-07 DIAGNOSIS — Z7951 Long term (current) use of inhaled steroids: Secondary | ICD-10-CM | POA: Diagnosis not present

## 2017-10-07 DIAGNOSIS — Z794 Long term (current) use of insulin: Secondary | ICD-10-CM | POA: Diagnosis not present

## 2017-10-07 DIAGNOSIS — E119 Type 2 diabetes mellitus without complications: Secondary | ICD-10-CM | POA: Insufficient documentation

## 2017-10-07 DIAGNOSIS — K644 Residual hemorrhoidal skin tags: Secondary | ICD-10-CM | POA: Diagnosis not present

## 2017-10-07 DIAGNOSIS — Z7982 Long term (current) use of aspirin: Secondary | ICD-10-CM | POA: Diagnosis not present

## 2017-10-07 DIAGNOSIS — I252 Old myocardial infarction: Secondary | ICD-10-CM | POA: Insufficient documentation

## 2017-10-07 DIAGNOSIS — J45909 Unspecified asthma, uncomplicated: Secondary | ICD-10-CM | POA: Diagnosis not present

## 2017-10-07 DIAGNOSIS — Z87891 Personal history of nicotine dependence: Secondary | ICD-10-CM | POA: Diagnosis not present

## 2017-10-07 DIAGNOSIS — K648 Other hemorrhoids: Secondary | ICD-10-CM | POA: Insufficient documentation

## 2017-10-07 DIAGNOSIS — Z79899 Other long term (current) drug therapy: Secondary | ICD-10-CM | POA: Insufficient documentation

## 2017-10-07 DIAGNOSIS — I10 Essential (primary) hypertension: Secondary | ICD-10-CM | POA: Diagnosis not present

## 2017-10-07 HISTORY — PX: HEMORRHOID SURGERY: SHX153

## 2017-10-07 HISTORY — DX: Nonrheumatic aortic (valve) stenosis: I35.0

## 2017-10-07 LAB — GLUCOSE, CAPILLARY
GLUCOSE-CAPILLARY: 208 mg/dL — AB (ref 65–99)
Glucose-Capillary: 179 mg/dL — ABNORMAL HIGH (ref 65–99)

## 2017-10-07 LAB — URINE DRUG SCREEN, QUALITATIVE (ARMC ONLY)
Amphetamines, Ur Screen: NOT DETECTED
Barbiturates, Ur Screen: NOT DETECTED
Benzodiazepine, Ur Scrn: NOT DETECTED
CANNABINOID 50 NG, UR ~~LOC~~: POSITIVE — AB
COCAINE METABOLITE, UR ~~LOC~~: NOT DETECTED
MDMA (Ecstasy)Ur Screen: NOT DETECTED
METHADONE SCREEN, URINE: NOT DETECTED
Opiate, Ur Screen: POSITIVE — AB
Phencyclidine (PCP) Ur S: NOT DETECTED
TRICYCLIC, UR SCREEN: NOT DETECTED

## 2017-10-07 SURGERY — HEMORRHOIDECTOMY
Anesthesia: General | Wound class: Contaminated

## 2017-10-07 MED ORDER — SUGAMMADEX SODIUM 200 MG/2ML IV SOLN
INTRAVENOUS | Status: AC
  Filled 2017-10-07: qty 2

## 2017-10-07 MED ORDER — ONDANSETRON HCL 4 MG/2ML IJ SOLN
INTRAMUSCULAR | Status: AC
  Filled 2017-10-07: qty 2

## 2017-10-07 MED ORDER — PROPOFOL 10 MG/ML IV BOLUS
INTRAVENOUS | Status: DC | PRN
  Administered 2017-10-07: 160 mg via INTRAVENOUS

## 2017-10-07 MED ORDER — LACTATED RINGERS IV SOLN
INTRAVENOUS | Status: DC | PRN
  Administered 2017-10-07: 09:00:00 via INTRAVENOUS

## 2017-10-07 MED ORDER — ACETAMINOPHEN 10 MG/ML IV SOLN
INTRAVENOUS | Status: AC
  Filled 2017-10-07: qty 100

## 2017-10-07 MED ORDER — SUGAMMADEX SODIUM 200 MG/2ML IV SOLN
INTRAVENOUS | Status: DC | PRN
  Administered 2017-10-07: 167.8 mg via INTRAVENOUS

## 2017-10-07 MED ORDER — CIPROFLOXACIN IN D5W 400 MG/200ML IV SOLN
INTRAVENOUS | Status: AC
  Filled 2017-10-07: qty 200

## 2017-10-07 MED ORDER — FENTANYL CITRATE (PF) 100 MCG/2ML IJ SOLN
25.0000 ug | INTRAMUSCULAR | Status: DC | PRN
  Administered 2017-10-07 (×4): 25 ug via INTRAVENOUS

## 2017-10-07 MED ORDER — FENTANYL CITRATE (PF) 100 MCG/2ML IJ SOLN
INTRAMUSCULAR | Status: AC
  Filled 2017-10-07: qty 2

## 2017-10-07 MED ORDER — BUPIVACAINE-EPINEPHRINE (PF) 0.5% -1:200000 IJ SOLN
INTRAMUSCULAR | Status: DC | PRN
  Administered 2017-10-07: 30 mL via PERINEURAL

## 2017-10-07 MED ORDER — FAMOTIDINE 20 MG PO TABS
ORAL_TABLET | ORAL | Status: AC
  Administered 2017-10-07: 20 mg via ORAL
  Filled 2017-10-07: qty 1

## 2017-10-07 MED ORDER — ONDANSETRON HCL 4 MG/2ML IJ SOLN
INTRAMUSCULAR | Status: DC | PRN
  Administered 2017-10-07: 4 mg via INTRAVENOUS

## 2017-10-07 MED ORDER — GABAPENTIN 300 MG PO CAPS: 300.0000 mg | ORAL_CAPSULE | Freq: Three times a day (TID) | ORAL | 0 refills | Status: DC

## 2017-10-07 MED ORDER — FENTANYL CITRATE (PF) 100 MCG/2ML IJ SOLN
INTRAMUSCULAR | Status: DC | PRN
  Administered 2017-10-07: 100 ug via INTRAVENOUS

## 2017-10-07 MED ORDER — BUPIVACAINE LIPOSOME 1.3 % IJ SUSP
INTRAMUSCULAR | Status: DC | PRN
  Administered 2017-10-07: 20 mL

## 2017-10-07 MED ORDER — LIDOCAINE HCL (PF) 2 % IJ SOLN
INTRAMUSCULAR | Status: AC
  Filled 2017-10-07: qty 10

## 2017-10-07 MED ORDER — BUPIVACAINE LIPOSOME 1.3 % IJ SUSP
INTRAMUSCULAR | Status: AC
  Filled 2017-10-07: qty 20

## 2017-10-07 MED ORDER — ROCURONIUM BROMIDE 50 MG/5ML IV SOLN
INTRAVENOUS | Status: AC
  Filled 2017-10-07: qty 1

## 2017-10-07 MED ORDER — LIDOCAINE HCL (CARDIAC) PF 100 MG/5ML IV SOSY
PREFILLED_SYRINGE | INTRAVENOUS | Status: DC | PRN
  Administered 2017-10-07: 40 mg via INTRAVENOUS

## 2017-10-07 MED ORDER — PROPOFOL 10 MG/ML IV BOLUS
INTRAVENOUS | Status: AC
  Filled 2017-10-07: qty 20

## 2017-10-07 MED ORDER — HYDROCODONE-ACETAMINOPHEN 5-325 MG PO TABS: 1.0000 | ORAL_TABLET | ORAL | 0 refills | Status: AC | PRN

## 2017-10-07 MED ORDER — MIDAZOLAM HCL 2 MG/2ML IJ SOLN
INTRAMUSCULAR | Status: DC | PRN
  Administered 2017-10-07: 2 mg via INTRAVENOUS

## 2017-10-07 MED ORDER — ROCURONIUM BROMIDE 100 MG/10ML IV SOLN
INTRAVENOUS | Status: DC | PRN
  Administered 2017-10-07: 40 mg via INTRAVENOUS
  Administered 2017-10-07: 10 mg via INTRAVENOUS

## 2017-10-07 MED ORDER — ACETAMINOPHEN 10 MG/ML IV SOLN
INTRAVENOUS | Status: DC | PRN
  Administered 2017-10-07: 1000 mg via INTRAVENOUS

## 2017-10-07 MED ORDER — FENTANYL CITRATE (PF) 100 MCG/2ML IJ SOLN
INTRAMUSCULAR | Status: AC
  Administered 2017-10-07: 25 ug via INTRAVENOUS
  Filled 2017-10-07: qty 2

## 2017-10-07 MED ORDER — BUPIVACAINE-EPINEPHRINE (PF) 0.5% -1:200000 IJ SOLN
INTRAMUSCULAR | Status: AC
  Filled 2017-10-07: qty 30

## 2017-10-07 MED ORDER — EPHEDRINE SULFATE 50 MG/ML IJ SOLN
INTRAMUSCULAR | Status: AC
  Filled 2017-10-07: qty 1

## 2017-10-07 MED ORDER — MIDAZOLAM HCL 2 MG/2ML IJ SOLN
INTRAMUSCULAR | Status: AC
  Filled 2017-10-07: qty 2

## 2017-10-07 MED ORDER — ONDANSETRON HCL 4 MG/2ML IJ SOLN: 4.0000 mg | Freq: Once | INTRAMUSCULAR | Status: DC | PRN

## 2017-10-07 SURGICAL SUPPLY — 36 items
BLADE SURG 15 STRL LF DISP TIS (BLADE) ×1 IMPLANT
BLADE SURG 15 STRL SS (BLADE) ×2
CANISTER SUCT 1200ML W/VALVE (MISCELLANEOUS) ×3 IMPLANT
DRAPE LAPAROTOMY 100X77 ABD (DRAPES) ×3 IMPLANT
DRAPE LEGGINS SURG 28X43 STRL (DRAPES) ×3 IMPLANT
DRAPE UNDER BUTTOCK W/FLU (DRAPES) ×3 IMPLANT
ELECT REM PT RETURN 9FT ADLT (ELECTROSURGICAL) ×3
ELECTRODE REM PT RTRN 9FT ADLT (ELECTROSURGICAL) ×1 IMPLANT
GAUZE SPONGE 4X4 12PLY STRL (GAUZE/BANDAGES/DRESSINGS) ×3 IMPLANT
GLOVE BIO SURGEON STRL SZ 6.5 (GLOVE) ×4 IMPLANT
GLOVE BIO SURGEONS STRL SZ 6.5 (GLOVE) ×2
GOWN STRL REUS W/ TWL LRG LVL3 (GOWN DISPOSABLE) ×2 IMPLANT
GOWN STRL REUS W/TWL LRG LVL3 (GOWN DISPOSABLE) ×4
HEMOSTAT SURGICEL 2X3 (HEMOSTASIS) ×3 IMPLANT
LABEL OR SOLS (LABEL) ×3 IMPLANT
NEEDLE HYPO 25X1 1.5 SAFETY (NEEDLE) ×3 IMPLANT
NS IRRIG 500ML POUR BTL (IV SOLUTION) ×3 IMPLANT
PACK BASIN MINOR ARMC (MISCELLANEOUS) ×3 IMPLANT
PAD ABD DERMACEA PRESS 5X9 (GAUZE/BANDAGES/DRESSINGS) ×3 IMPLANT
PAD PREP 24X41 OB/GYN DISP (PERSONAL CARE ITEMS) ×3 IMPLANT
SHEARS HARMONIC 9CM CVD (BLADE) ×3 IMPLANT
SOL PREP PVP 2OZ (MISCELLANEOUS) ×3
SOLUTION PREP PVP 2OZ (MISCELLANEOUS) ×1 IMPLANT
SPONGE XRAY 4X4 16PLY STRL (MISCELLANEOUS) ×3 IMPLANT
STAPLER PROXIMATE HCS (STAPLE) IMPLANT
STRAP SAFETY 5IN WIDE (MISCELLANEOUS) ×3 IMPLANT
SURGILUBE 2OZ TUBE FLIPTOP (MISCELLANEOUS) ×3 IMPLANT
SUT ETHILON 3-0 FS-10 30 BLK (SUTURE) ×3
SUT VIC AB 2-0 SH 27 (SUTURE) ×2
SUT VIC AB 2-0 SH 27XBRD (SUTURE) ×1 IMPLANT
SUT VIC AB 3-0 SH 27 (SUTURE) ×6
SUT VIC AB 3-0 SH 27X BRD (SUTURE) ×3 IMPLANT
SUTURE EHLN 3-0 FS-10 30 BLK (SUTURE) ×1 IMPLANT
SYR 10ML LL (SYRINGE) ×3 IMPLANT
SYR 20CC LL (SYRINGE) ×9 IMPLANT
SYR BULB IRRIG 60ML STRL (SYRINGE) ×3 IMPLANT

## 2017-10-07 NOTE — OR Nursing (Signed)
With green emesis on arrival to postop; no nausea at time of discharge, taking sips water and ice chips

## 2017-10-07 NOTE — Brief Op Note (Signed)
10/07/2017  10:49 AM  PATIENT:  Nancy [name]  57 y.o. female  PRE-OPERATIVE DIAGNOSIS:  internal and external hemorrhoids,bleeding  POST-OPERATIVE DIAGNOSIS:  internal and external hemorrhoids,bleeding  PROCEDURE:  Procedure(s): HEMORRHOIDECTOMY (N/A)  SURGEON:  Surgeon(s) and Role:    * Carolan Shiver, MD - Primary  ANESTHESIA:   local and general  EBL:  25 mL

## 2017-10-07 NOTE — Anesthesia Procedure Notes (Signed)
Procedure Name: Intubation Date/Time: 10/07/2017 9:05 AM Performed by: Allean Found, CRNA Pre-anesthesia Checklist: Patient identified, Emergency Drugs available, Suction available, Patient being monitored and Timeout performed Patient Re-evaluated:Patient Re-evaluated prior to induction Oxygen Delivery Method: Circle system utilized Preoxygenation: Pre-oxygenation with 100% oxygen Induction Type: IV induction Ventilation: Mask ventilation without difficulty Laryngoscope Size: Mac and 3 Grade View: Grade I Tube type: Oral Tube size: 7.0 mm Number of attempts: 1 Airway Equipment and Method: Stylet Placement Confirmation: ETT inserted through vocal cords under direct vision,  positive ETCO2 and breath sounds checked- equal and bilateral Secured at: 21 cm Tube secured with: Tape Dental Injury: Teeth and Oropharynx as per pre-operative assessment

## 2017-10-07 NOTE — Transfer of Care (Signed)
Immediate Anesthesia Transfer of Care Note  Patient: Nancy [name]  Procedure(s) Performed: HEMORRHOIDECTOMY (N/A )  Patient Location: PACU  Anesthesia Type:General  Level of Consciousness: awake  Airway & Oxygen Therapy: Patient Spontanous Breathing and Patient connected to face mask oxygen  Post-op Assessment: Report given to RN and Post -op Vital signs reviewed and stable  Post vital signs: Reviewed and stable  Last Vitals:  Vitals Value Taken Time  BP    Temp    Pulse    Resp    SpO2      Last Pain:  Vitals:   10/07/17 0709  TempSrc: Tympanic  PainSc: 6          Complications: No apparent anesthesia complications

## 2017-10-07 NOTE — Anesthesia Preprocedure Evaluation (Addendum)
Anesthesia Evaluation  Patient identified by MRN, date of birth, ID band Patient awake    Reviewed: Allergy & Precautions, NPO status , Patient's Chart, lab work & pertinent test results, reviewed documented beta blocker date and time   Airway Mallampati: III       Dental  (+) Teeth Intact   Pulmonary asthma , pneumonia, resolved, former smoker,    Pulmonary exam normal        Cardiovascular hypertension, Pt. on medications and Pt. on home beta blockers + Past MI  Normal cardiovascular exam+ Valvular Problems/Murmurs AS      Neuro/Psych Seizures -,  PSYCHIATRIC DISORDERS Anxiety Depression    GI/Hepatic Neg liver ROS, GERD  Medicated,  Endo/Other  diabetes  Renal/GU negative Renal ROS  negative genitourinary   Musculoskeletal  (+) Arthritis , Osteoarthritis,    Abdominal Normal abdominal exam  (+)   Peds negative pediatric ROS (+)  Hematology negative hematology ROS (+)   Anesthesia Other Findings   Reproductive/Obstetrics                            Anesthesia Physical Anesthesia Plan  ASA: III  Anesthesia Plan: General   Post-op Pain Management:    Induction: Intravenous  PONV Risk Score and Plan:   Airway Management Planned: Oral ETT  Additional Equipment:   Intra-op Plan:   Post-operative Plan: Extubation in OR  Informed Consent: I have reviewed the patients History and Physical, chart, labs and discussed the procedure including the risks, benefits and alternatives for the proposed anesthesia with the patient or authorized representative who has indicated his/her understanding and acceptance.   Dental advisory given  Plan Discussed with: CRNA and Surgeon  Anesthesia Plan Comments:         Anesthesia Quick Evaluation

## 2017-10-07 NOTE — Anesthesia Post-op Follow-up Note (Signed)
Anesthesia QCDR form completed.        

## 2017-10-07 NOTE — Discharge Instructions (Addendum)
°  Diet: Resume home heart healthy regular diet.   Activity: No heavy lifting >20 pounds (children, pets, laundry, garbage) or strenuous activity until follow-up, but light activity and walking are encouraged. Do not drive or drink alcohol if taking narcotic pain medications.  Wound care: Remove dressing before next bowel movement. Once dressing removed, may shower with soapy water and pat dry (do not rub incisions), but no baths or submerging incision underwater until follow-up. (no swimming)   Medications: Resume all home medications except aspirin. Start taking aspirin on Saturday 10/09/17. For mild to moderate pain: acetaminophen (Tylenol) or ibuprofen (if no kidney disease). Combining Tylenol with alcohol can substantially increase your risk of causing liver disease. Narcotic pain medications, if prescribed, can be used for severe pain, though may cause nausea, constipation, and drowsiness. Do not combine Tylenol and Norco within a 6 hour period as Norco contains Tylenol. If you do not need the narcotic pain medication, you do not need to fill the prescription.  Needs to continue taking fiber supplementation 20-30 grams per day with 8 glasses of water a day  May need to use Dulcolax for constipation.  If pain is not controlled with pain medication may try RectiCare as found over the counter. This is an anesthetic ointment.   Call office [phone]) at any time if any questions, worsening pain, fevers/chills, bleeding, drainage from incision site, or other concerns.  AMBULATORY SURGERY  DISCHARGE INSTRUCTIONS   1) The drugs that you were given will stay in your system until tomorrow so for the next 24 hours you should not:  A) Drive an automobile B) Make any legal decisions C) Drink any alcoholic beverage   2) You may resume regular meals tomorrow.  Today it is better to start with liquids and gradually work up to solid foods.  You may eat anything you prefer, but it is better to  start with liquids, then soup and crackers, and gradually work up to solid foods.   3) Please notify your doctor immediately if you have any unusual bleeding, trouble breathing, redness and pain at the surgery site, drainage, fever, or pain not relieved by medication.    4) Additional Instructions:        Please contact your physician with any problems or Same Day Surgery at [phone], Monday through Friday 6 am to 4 pm, or [place] at [facility] number at [phone].

## 2017-10-07 NOTE — Op Note (Signed)
Preoperative diagnosis: internal and external hemorrhoids with bleeding.   Postoperative diagnosis: internal and external hemorrhoids with bleeding.  Procedure: Anoscopy, hemorrhoidectomy.  Surgeon: Dr. Hazle Quant  Anesthesia: Spinal  Wound classification: Clean Contaminated  Indications: Patient is a 57 y.o. female was found to have symptomatic hemorrhoids refractory to medical managemen.   Findings: 1. third degree hemorrhoids 2. Internal and external anal sphincter identified and preserved 3. Adequate hemostasis  Description of procedure: The patient was brought to the operating room and general anesthesia was induced. Patient was placed in the prone lithotomy position. A time-out was completed verifying correct patient, procedure, site, positioning, and implant(s) and/or special equipment prior to beginning this procedure.   The perineum was prepped and draped in standard sterile fashion. Local anesthetic was injected as a perianal block. An anoscope was introduced and the three hemorrhoidal pedicles were identified. A Kelly clamp was placed near the base of each pedicle near the dentate line and retracted externally to exteriorize the hemorrhoidal pedicles.  Each pedicle was excised in turn in the following fashion. An elliptical incision was made extending from perianal skin to anorectal ring including both internal and external hemorrhoids and excising a minimum amount of anoderm. Flaps were developed on both aspects of the incision, taking care to elevate only skin and mucosa. The dilated venous mass was dissected using Harmonic device from the underlying sphincter muscle. The base was ligated with a 3-0 Vicryl figure of 8 suture. The pedicle was amputated from the base. Hemostasis was achieved using electrocautery. Following hemostasis, the skin and mucosal incisions were closed with a running lock stitch of 3-0 Vicryl on the mucosal aspect and converted to subcuticular once skin was  encountered. The anal canal was then injected with local anesthetic. A gauze pad was tucked between the gluteal folds.  The patient tolerated the procedure well and was taken to the postanesthesia care unit in stable condition.   Specimen: Right anterior hemorrhoid         Right posterior hemorrhoid          Left lateral hemorrhoid  Complications: none  EBL: 25 mL

## 2017-10-07 NOTE — Interval H&P Note (Signed)
History and Physical Interval Note:  10/07/2017 8:43 AM  [name]  has presented today for surgery, with the diagnosis of internal and external hemorrhoids,bleeding  The various methods of treatment have been discussed with the patient and family. After consideration of risks, benefits and other options for treatment, the patient has consented to  Procedure(s): HEMORRHOIDECTOMY (N/A) as a surgical intervention .  The patient's history has been reviewed, patient examined, no change in status, stable for surgery.  I have reviewed the patient's chart and labs.  Questions were answered to the patient's satisfaction.     Carolan Shiver

## 2017-10-08 NOTE — Anesthesia Postprocedure Evaluation (Signed)
Anesthesia Post Note  Patient: Nancy [name]  Procedure(s) Performed: HEMORRHOIDECTOMY (N/A )  Patient location during evaluation: PACU Anesthesia Type: General Level of consciousness: awake and alert and oriented Pain management: pain level controlled Vital Signs Assessment: post-procedure vital signs reviewed and stable Respiratory status: spontaneous breathing Cardiovascular status: blood pressure returned to baseline Anesthetic complications: no     Last Vitals:  Vitals:   10/07/17 1128 10/07/17 1149  BP: (!) 148/78 137/79  Pulse: 78 74  Resp: 14 16  Temp: (!) 36.3 C 36.7 C  SpO2: 97% 97%    Last Pain:  Vitals:   10/07/17 1149  TempSrc: Temporal  PainSc: 0-No pain                 [name]

## 2017-10-09 LAB — SURGICAL PATHOLOGY

## 2020-01-02 ENCOUNTER — Other Ambulatory Visit: Payer: Self-pay | Admitting: Family

## 2020-01-02 DIAGNOSIS — Z1231 Encounter for screening mammogram for malignant neoplasm of breast: Secondary | ICD-10-CM

## 2020-01-02 DIAGNOSIS — M79671 Pain in right foot: Secondary | ICD-10-CM

## 2020-01-02 DIAGNOSIS — R591 Generalized enlarged lymph nodes: Secondary | ICD-10-CM

## 2020-01-15 ENCOUNTER — Other Ambulatory Visit: Payer: Self-pay

## 2020-01-15 ENCOUNTER — Ambulatory Visit
Admission: RE | Admit: 2020-01-15 | Discharge: 2020-01-15 | Disposition: A | Discharge status: Home / Self Care | Payer: Medicaid Other | Source: Ambulatory Visit | Attending: Family | Admitting: Family

## 2020-01-15 DIAGNOSIS — Z1231 Encounter for screening mammogram for malignant neoplasm of breast: Secondary | ICD-10-CM | POA: Diagnosis present

## 2020-01-18 ENCOUNTER — Ambulatory Visit: Payer: Medicaid Other | Attending: Family

## 2020-04-17 ENCOUNTER — Ambulatory Visit: Payer: Medicaid Other | Admitting: Gastroenterology

## 2021-09-08 ENCOUNTER — Other Ambulatory Visit: Payer: Self-pay | Admitting: Family Medicine

## 2021-09-08 DIAGNOSIS — Z1231 Encounter for screening mammogram for malignant neoplasm of breast: Secondary | ICD-10-CM

## 2021-11-28 IMAGING — MG DIGITAL SCREENING BILAT W/ TOMO W/ CAD
8 series · 8 of 24 positions shown · non-contrast
Comparison: Previous exam(s).

CLINICAL DATA: Screening.

EXAM:
DIGITAL SCREENING BILATERAL MAMMOGRAM WITH TOMO AND CAD

[L CC synth-2D]
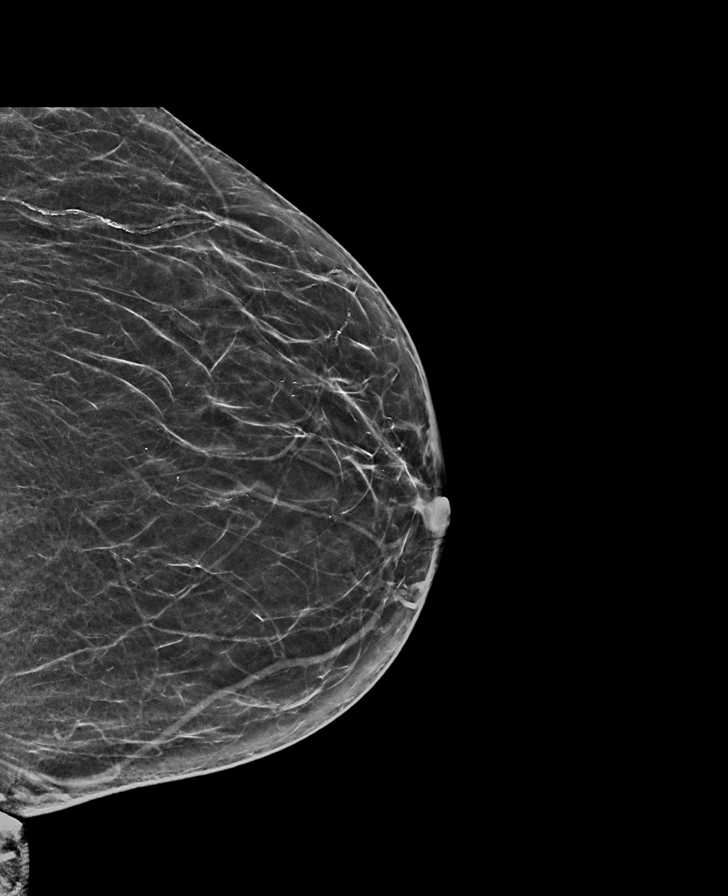

[R MLO synth-2D]
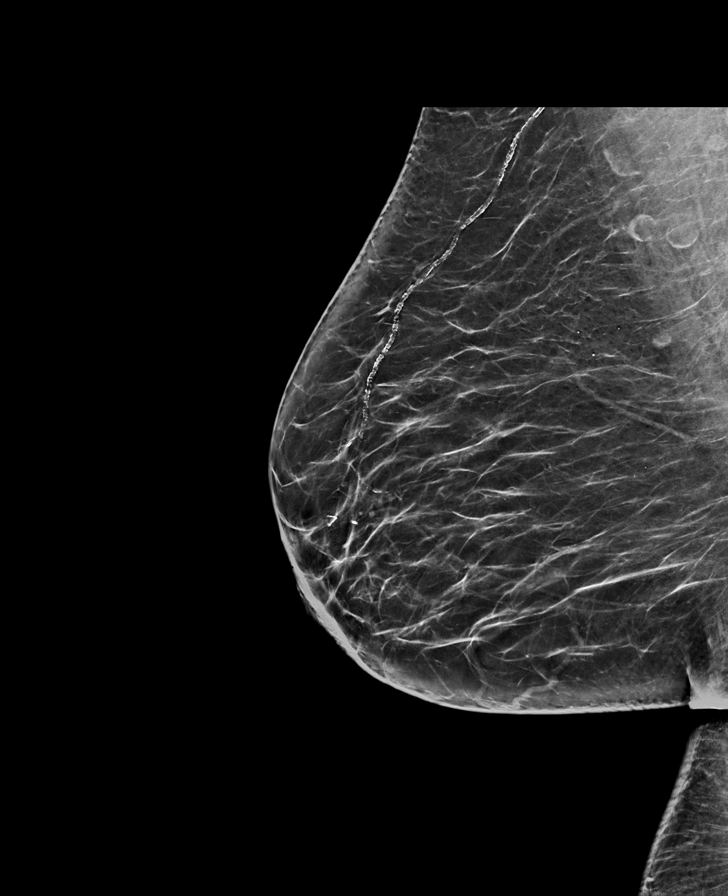

[R CC synth-2D]
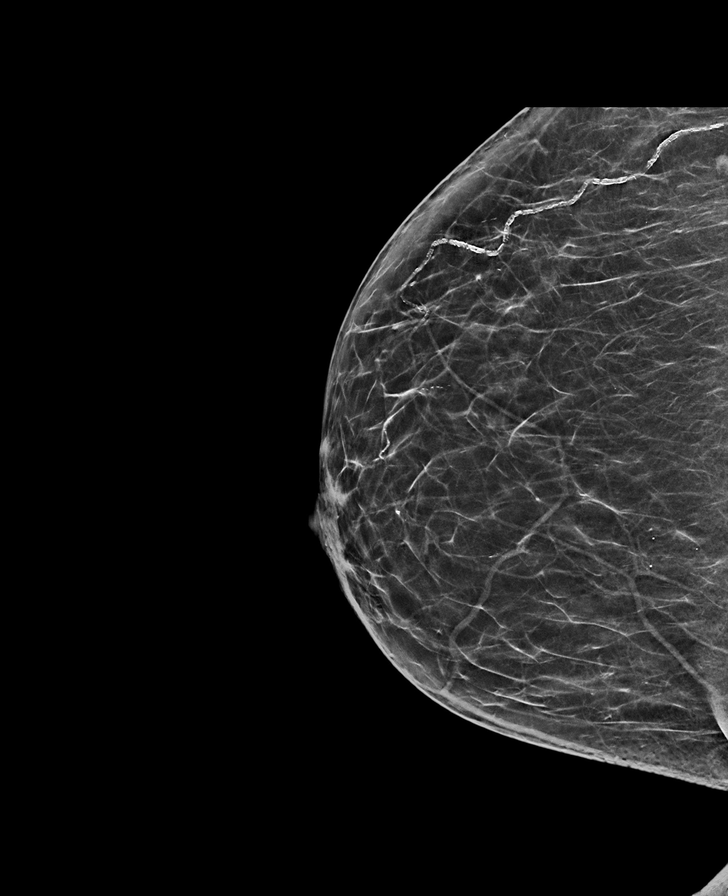

[L MLO synth-2D]
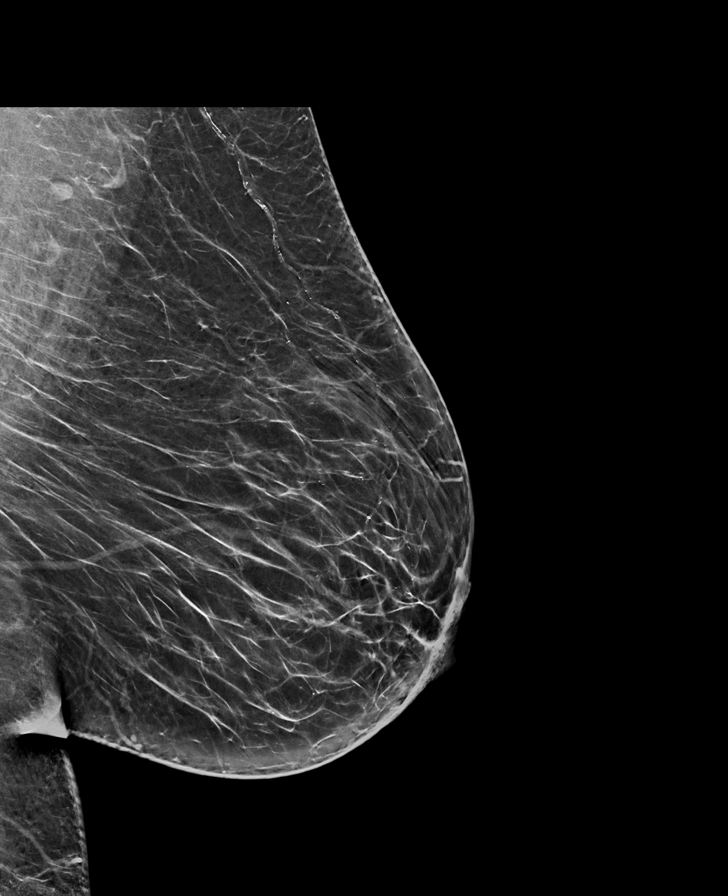

[R MLO tomo · tomo slice 35/69.0]
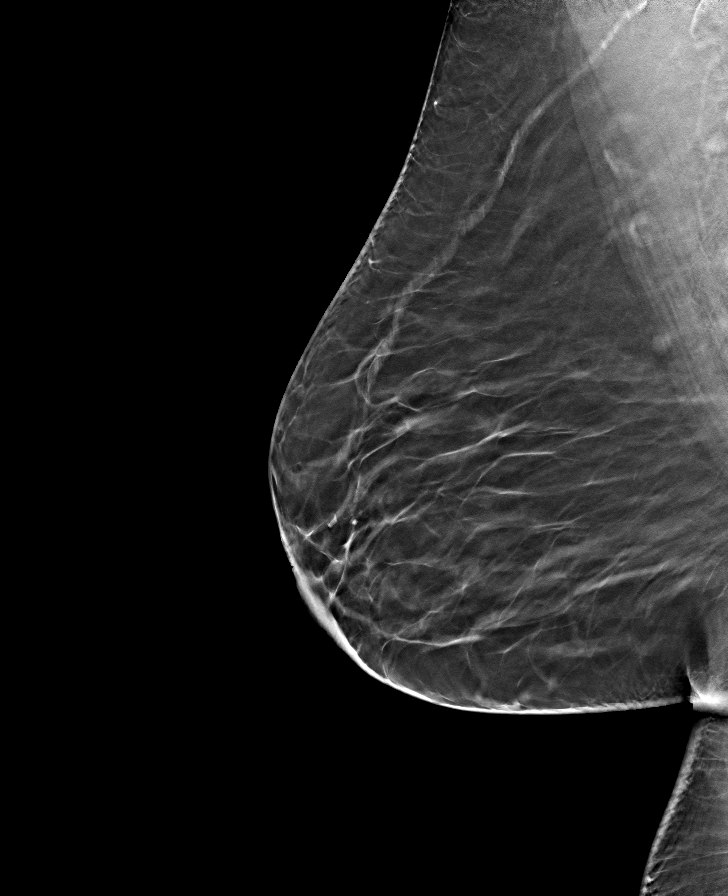

[L CC tomo · tomo slice 29/58.0]
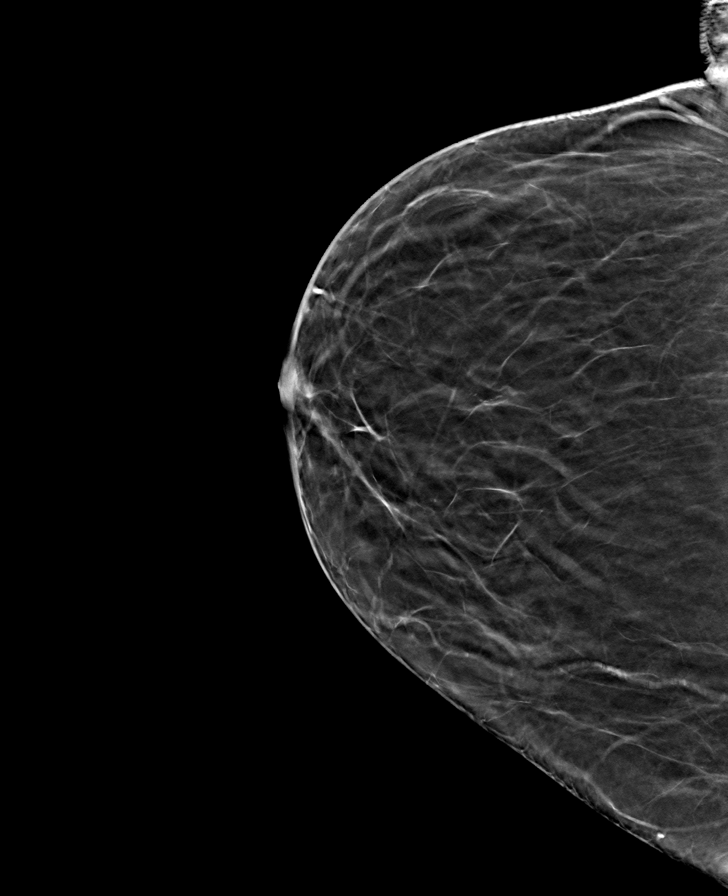

[R CC tomo · tomo slice 30/59.0]
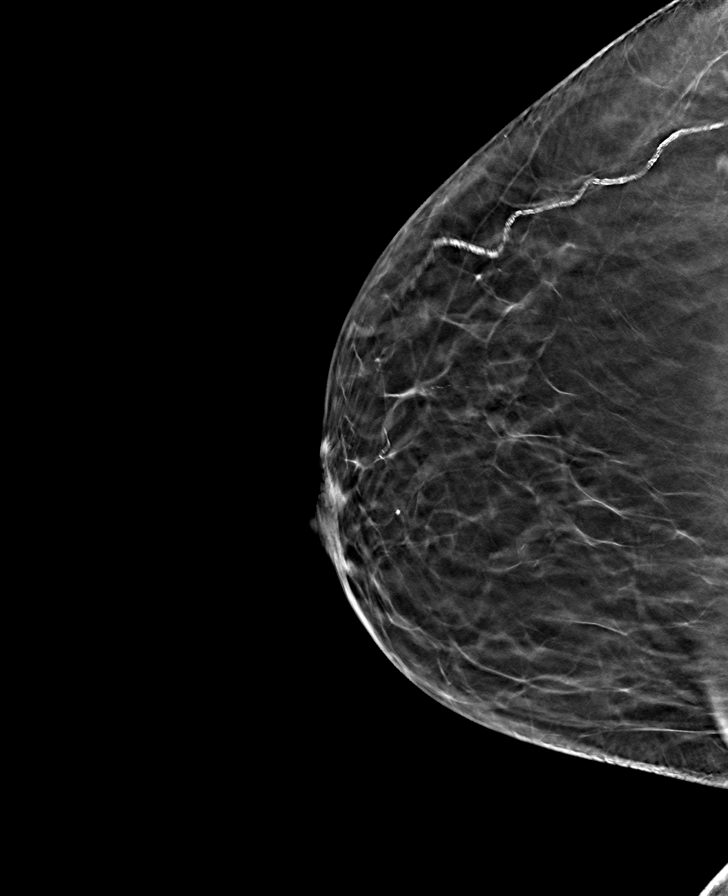

[L MLO tomo · tomo slice 35/69.0]
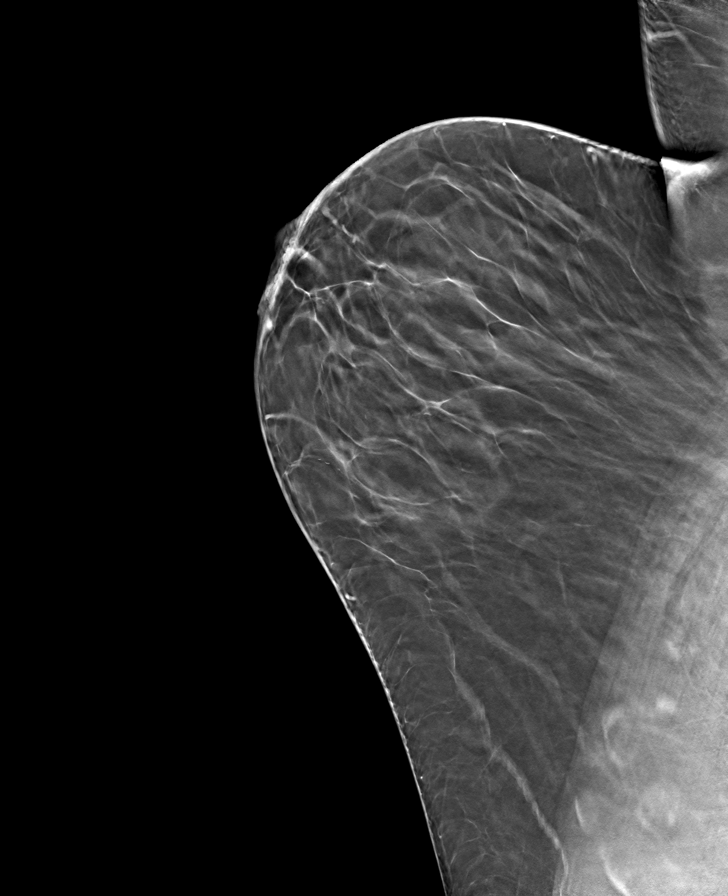

[8 of 24 positions shown; findings below may reference images not displayed]

ACR Breast Density Category b: There are scattered areas of
fibroglandular density.
FINDINGS: There are no findings suspicious for malignancy. Images were
processed with CAD.
IMPRESSION: No mammographic evidence of malignancy. A result letter of this
screening mammogram will be mailed directly to the patient.

RECOMMENDATION:
Screening mammogram in one year. (Code:[id number])

BI-RADS CATEGORY  1: Negative.

## 2022-01-02 ENCOUNTER — Encounter: Payer: Self-pay | Admitting: Emergency Medicine

## 2022-01-02 ENCOUNTER — Other Ambulatory Visit: Payer: Self-pay

## 2022-01-02 ENCOUNTER — Emergency Department: Payer: Medicaid Other

## 2022-01-02 ENCOUNTER — Inpatient Hospital Stay
Admission: EM | Admit: 2022-01-02 | Discharge: 2022-01-05 | DRG: 286 | Disposition: A | Discharge status: Other Acute Inpatient Hospital | Payer: Medicaid Other | Attending: Osteopathic Medicine | Admitting: Osteopathic Medicine

## 2022-01-02 DIAGNOSIS — E1142 Type 2 diabetes mellitus with diabetic polyneuropathy: Secondary | ICD-10-CM | POA: Diagnosis present

## 2022-01-02 DIAGNOSIS — Z803 Family history of malignant neoplasm of breast: Secondary | ICD-10-CM | POA: Diagnosis not present

## 2022-01-02 DIAGNOSIS — I25118 Atherosclerotic heart disease of native coronary artery with other forms of angina pectoris: Secondary | ICD-10-CM | POA: Diagnosis present

## 2022-01-02 DIAGNOSIS — I248 Other forms of acute ischemic heart disease: Secondary | ICD-10-CM | POA: Diagnosis present

## 2022-01-02 DIAGNOSIS — J45909 Unspecified asthma, uncomplicated: Secondary | ICD-10-CM | POA: Diagnosis present

## 2022-01-02 DIAGNOSIS — I509 Heart failure, unspecified: Secondary | ICD-10-CM | POA: Diagnosis not present

## 2022-01-02 DIAGNOSIS — I77819 Aortic ectasia, unspecified site: Secondary | ICD-10-CM | POA: Diagnosis present

## 2022-01-02 DIAGNOSIS — I10 Essential (primary) hypertension: Secondary | ICD-10-CM | POA: Diagnosis not present

## 2022-01-02 DIAGNOSIS — Z7982 Long term (current) use of aspirin: Secondary | ICD-10-CM | POA: Diagnosis not present

## 2022-01-02 DIAGNOSIS — E785 Hyperlipidemia, unspecified: Secondary | ICD-10-CM | POA: Diagnosis present

## 2022-01-02 DIAGNOSIS — F419 Anxiety disorder, unspecified: Secondary | ICD-10-CM | POA: Diagnosis present

## 2022-01-02 DIAGNOSIS — I110 Hypertensive heart disease with heart failure: Secondary | ICD-10-CM | POA: Diagnosis present

## 2022-01-02 DIAGNOSIS — Z7985 Long-term (current) use of injectable non-insulin antidiabetic drugs: Secondary | ICD-10-CM

## 2022-01-02 DIAGNOSIS — Z87891 Personal history of nicotine dependence: Secondary | ICD-10-CM

## 2022-01-02 DIAGNOSIS — Z7951 Long term (current) use of inhaled steroids: Secondary | ICD-10-CM

## 2022-01-02 DIAGNOSIS — I5031 Acute diastolic (congestive) heart failure: Secondary | ICD-10-CM | POA: Diagnosis present

## 2022-01-02 DIAGNOSIS — K219 Gastro-esophageal reflux disease without esophagitis: Secondary | ICD-10-CM | POA: Diagnosis present

## 2022-01-02 DIAGNOSIS — J9601 Acute respiratory failure with hypoxia: Secondary | ICD-10-CM | POA: Diagnosis present

## 2022-01-02 DIAGNOSIS — Z66 Do not resuscitate: Secondary | ICD-10-CM | POA: Diagnosis present

## 2022-01-02 DIAGNOSIS — Z8701 Personal history of pneumonia (recurrent): Secondary | ICD-10-CM

## 2022-01-02 DIAGNOSIS — Z79899 Other long term (current) drug therapy: Secondary | ICD-10-CM | POA: Diagnosis not present

## 2022-01-02 DIAGNOSIS — I350 Nonrheumatic aortic (valve) stenosis: Secondary | ICD-10-CM | POA: Diagnosis present

## 2022-01-02 DIAGNOSIS — Z886 Allergy status to analgesic agent status: Secondary | ICD-10-CM

## 2022-01-02 DIAGNOSIS — Z20822 Contact with and (suspected) exposure to covid-19: Secondary | ICD-10-CM | POA: Diagnosis present

## 2022-01-02 DIAGNOSIS — R778 Other specified abnormalities of plasma proteins: Secondary | ICD-10-CM

## 2022-01-02 DIAGNOSIS — I5030 Unspecified diastolic (congestive) heart failure: Secondary | ICD-10-CM

## 2022-01-02 DIAGNOSIS — Z888 Allergy status to other drugs, medicaments and biological substances status: Secondary | ICD-10-CM | POA: Diagnosis not present

## 2022-01-02 DIAGNOSIS — D649 Anemia, unspecified: Secondary | ICD-10-CM | POA: Diagnosis present

## 2022-01-02 DIAGNOSIS — R000 Tachycardia, unspecified: Secondary | ICD-10-CM | POA: Diagnosis present

## 2022-01-02 DIAGNOSIS — F32A Depression, unspecified: Secondary | ICD-10-CM | POA: Diagnosis present

## 2022-01-02 DIAGNOSIS — I252 Old myocardial infarction: Secondary | ICD-10-CM

## 2022-01-02 LAB — COMPREHENSIVE METABOLIC PANEL
ALT: 19 U/L (ref 0–44)
AST: 21 U/L (ref 15–41)
Albumin: 3.9 g/dL (ref 3.5–5.0)
Alkaline Phosphatase: 64 U/L (ref 38–126)
Anion gap: 10 (ref 5–15)
BUN: 12 mg/dL (ref 8–23)
CO2: 24 mmol/L (ref 22–32)
Calcium: 8.6 mg/dL — ABNORMAL LOW (ref 8.9–10.3)
Chloride: 103 mmol/L (ref 98–111)
Creatinine, Ser: 0.67 mg/dL (ref 0.44–1.00)
GFR, Estimated: >60 mL/min (ref >60)
Glucose, Bld: 218 mg/dL — ABNORMAL HIGH (ref 70–99)
Potassium: 4.1 mmol/L (ref 3.5–5.1)
Sodium: 137 mmol/L (ref 135–145)
Total Bilirubin: 0.7 mg/dL (ref 0.3–1.2)
Total Protein: 7.2 g/dL (ref 6.5–8.1)

## 2022-01-02 LAB — CBC WITH DIFFERENTIAL/PLATELET
Abs Immature Granulocytes: 0.04 10*3/uL (ref 0.00–0.07)
Basophils Absolute: 0 10*3/uL (ref 0.0–0.1)
Basophils Relative: 0 %
Eosinophils Absolute: 0.1 10*3/uL (ref 0.0–0.5)
Eosinophils Relative: 1 %
HCT: 45.4 % (ref 36.0–46.0)
Hemoglobin: 15.2 g/dL — ABNORMAL HIGH (ref 12.0–15.0)
Immature Granulocytes: 0 %
Lymphocytes Relative: 17 %
Lymphs Abs: 1.7 10*3/uL (ref 0.7–4.0)
MCH: 29.7 pg (ref 26.0–34.0)
MCHC: 33.5 g/dL (ref 30.0–36.0)
MCV: 88.7 fL (ref 80.0–100.0)
Monocytes Absolute: 0.8 10*3/uL (ref 0.1–1.0)
Monocytes Relative: 8 %
Neutro Abs: 7.1 10*3/uL (ref 1.7–7.7)
Neutrophils Relative %: 74 %
Platelets: 162 10*3/uL (ref 150–400)
RBC: 5.12 MIL/uL — ABNORMAL HIGH (ref 3.87–5.11)
RDW: 12.9 % (ref 11.5–15.5)
WBC: 9.7 10*3/uL (ref 4.0–10.5)
nRBC: 0 % (ref 0.0–0.2)

## 2022-01-02 LAB — URINALYSIS, ROUTINE W REFLEX MICROSCOPIC
Bacteria, UA: NONE SEEN
Bilirubin Urine: NEGATIVE
Glucose, UA: >=500 mg/dL — AB
Hgb urine dipstick: NEGATIVE
Ketones, ur: NEGATIVE mg/dL
Leukocytes,Ua: NEGATIVE
Nitrite: NEGATIVE
Protein, ur: NEGATIVE mg/dL
Specific Gravity, Urine: 1.031 — ABNORMAL HIGH (ref 1.005–1.030)
pH: 7 (ref 5.0–8.0)

## 2022-01-02 LAB — BRAIN NATRIURETIC PEPTIDE: B Natriuretic Peptide: 757.2 pg/mL — ABNORMAL HIGH (ref 0.0–100.0)

## 2022-01-02 LAB — RESP PANEL BY RT-PCR (FLU A&B, COVID) ARPGX2
Influenza A by PCR: NEGATIVE
Influenza B by PCR: NEGATIVE
SARS Coronavirus 2 by RT PCR: NEGATIVE

## 2022-01-02 LAB — PROTIME-INR
INR: 1.1 (ref 0.8–1.2)
Prothrombin Time: 14.1 seconds (ref 11.4–15.2)

## 2022-01-02 LAB — LACTIC ACID, PLASMA
Lactic Acid, Venous: 2 mmol/L (ref 0.5–1.9)
Lactic Acid, Venous: 1.5 mmol/L (ref 0.5–1.9)

## 2022-01-02 LAB — TROPONIN I (HIGH SENSITIVITY)
Troponin I (High Sensitivity): 238 ng/L (ref <18)
Troponin I (High Sensitivity): 197 ng/L (ref <18)

## 2022-01-02 LAB — APTT: aPTT: 28 seconds (ref 24–36)

## 2022-01-02 MED ORDER — HEPARIN BOLUS VIA INFUSION
4000.0000 [IU] | Freq: Once | INTRAVENOUS | Status: AC
  Administered 2022-01-02: 4000 [IU] via INTRAVENOUS
  Filled 2022-01-02: qty 4000

## 2022-01-02 MED ORDER — FUROSEMIDE 10 MG/ML IJ SOLN
40.0000 mg | Freq: Two times a day (BID) | INTRAMUSCULAR | Status: DC
  Administered 2022-01-03 – 2022-01-05 (×6): 40 mg via INTRAVENOUS
  Filled 2022-01-02 (×5): qty 4

## 2022-01-02 MED ORDER — LOSARTAN POTASSIUM 50 MG PO TABS
100.0000 mg | ORAL_TABLET | Freq: Every day | ORAL | Status: DC
  Administered 2022-01-03 – 2022-01-05 (×3): 100 mg via ORAL
  Filled 2022-01-02 (×3): qty 2

## 2022-01-02 MED ORDER — METHOCARBAMOL 500 MG PO TABS
500.0000 mg | ORAL_TABLET | Freq: Four times a day (QID) | ORAL | Status: DC | PRN
  Administered 2022-01-05: 500 mg via ORAL
  Filled 2022-01-02: qty 1

## 2022-01-02 MED ORDER — INSULIN GLARGINE-YFGN 100 UNIT/ML ~~LOC~~ SOLN
30.0000 [IU] | Freq: Every day | SUBCUTANEOUS | Status: DC
  Administered 2022-01-03 (×2): 30 [IU] via SUBCUTANEOUS
  Filled 2022-01-02 (×2): qty 0.3

## 2022-01-02 MED ORDER — AMLODIPINE BESYLATE 10 MG PO TABS
10.0000 mg | ORAL_TABLET | Freq: Every day | ORAL | Status: DC
  Administered 2022-01-03 – 2022-01-05 (×3): 10 mg via ORAL
  Filled 2022-01-02 (×2): qty 1
  Filled 2022-01-02: qty 2

## 2022-01-02 MED ORDER — ASPIRIN 81 MG PO TBEC
81.0000 mg | DELAYED_RELEASE_TABLET | Freq: Every day | ORAL | Status: DC
  Administered 2022-01-03 – 2022-01-04 (×2): 81 mg via ORAL
  Filled 2022-01-02 (×2): qty 1

## 2022-01-02 MED ORDER — ACETAMINOPHEN 650 MG RE SUPP: 650.0000 mg | Freq: Four times a day (QID) | RECTAL | Status: DC | PRN

## 2022-01-02 MED ORDER — TRAZODONE HCL 50 MG PO TABS: 25.0000 mg | ORAL_TABLET | Freq: Every evening | ORAL | Status: DC | PRN

## 2022-01-02 MED ORDER — RISAQUAD PO CAPS
ORAL_CAPSULE | Freq: Every day | ORAL | Status: DC
  Administered 2022-01-03 – 2022-01-05 (×3): 1 via ORAL
  Filled 2022-01-02 (×3): qty 1

## 2022-01-02 MED ORDER — ACETAMINOPHEN-CODEINE #3 300-30 MG PO TABS
1.0000 | ORAL_TABLET | Freq: Two times a day (BID) | ORAL | Status: DC | PRN
  Administered 2022-01-03: 1 via ORAL
  Filled 2022-01-02 (×2): qty 1

## 2022-01-02 MED ORDER — ALBUTEROL SULFATE HFA 108 (90 BASE) MCG/ACT IN AERS: 2.0000 | INHALATION_SPRAY | Freq: Four times a day (QID) | RESPIRATORY_TRACT | Status: DC | PRN

## 2022-01-02 MED ORDER — FUROSEMIDE 10 MG/ML IJ SOLN
40.0000 mg | Freq: Once | INTRAMUSCULAR | Status: AC
  Administered 2022-01-02: 40 mg via INTRAVENOUS
  Filled 2022-01-02: qty 4

## 2022-01-02 MED ORDER — ACETAMINOPHEN 325 MG PO TABS
650.0000 mg | ORAL_TABLET | Freq: Four times a day (QID) | ORAL | Status: DC | PRN
  Administered 2022-01-03 – 2022-01-04 (×2): 650 mg via ORAL
  Filled 2022-01-02 (×2): qty 2

## 2022-01-02 MED ORDER — SODIUM CHLORIDE 0.9 % IV BOLUS
500.0000 mL | Freq: Once | INTRAVENOUS | Status: AC
  Administered 2022-01-02: 500 mL via INTRAVENOUS

## 2022-01-02 MED ORDER — GABAPENTIN 300 MG PO CAPS
300.0000 mg | ORAL_CAPSULE | Freq: Every day | ORAL | Status: DC
  Administered 2022-01-03 – 2022-01-05 (×3): 300 mg via ORAL
  Filled 2022-01-02 (×3): qty 1

## 2022-01-02 MED ORDER — HEPARIN (PORCINE) 25000 UT/250ML-% IV SOLN
1750.0000 [IU]/h | INTRAVENOUS | Status: DC
  Administered 2022-01-02: 850 [IU]/h via INTRAVENOUS
  Administered 2022-01-03: 1050 [IU]/h via INTRAVENOUS
  Administered 2022-01-03: 1300 [IU]/h via INTRAVENOUS
  Administered 2022-01-04: 1600 [IU]/h via INTRAVENOUS
  Administered 2022-01-05: 1750 [IU]/h via INTRAVENOUS
  Filled 2022-01-02 (×4): qty 250

## 2022-01-02 MED ORDER — ONDANSETRON HCL 4 MG PO TABS: 4.0000 mg | ORAL_TABLET | Freq: Four times a day (QID) | ORAL | Status: DC | PRN

## 2022-01-02 MED ORDER — MAGNESIUM HYDROXIDE 400 MG/5ML PO SUSP: 30.0000 mL | Freq: Every day | ORAL | Status: DC | PRN

## 2022-01-02 MED ORDER — ONDANSETRON HCL 4 MG/2ML IJ SOLN
4.0000 mg | Freq: Once | INTRAMUSCULAR | Status: AC
  Administered 2022-01-02: 4 mg via INTRAVENOUS
  Filled 2022-01-02: qty 2

## 2022-01-02 MED ORDER — DICLOFENAC SODIUM 1 % TD GEL
2.0000 g | Freq: Every day | TRANSDERMAL | Status: DC | PRN
Start: 2022-01-02 — End: 2022-01-03

## 2022-01-02 MED ORDER — METOPROLOL SUCCINATE ER 50 MG PO TB24
50.0000 mg | ORAL_TABLET | Freq: Every day | ORAL | Status: DC
  Administered 2022-01-03 – 2022-01-05 (×2): 50 mg via ORAL
  Filled 2022-01-02 (×3): qty 1

## 2022-01-02 MED ORDER — EMPAGLIFLOZIN 10 MG PO TABS
10.0000 mg | ORAL_TABLET | Freq: Every day | ORAL | Status: DC
  Administered 2022-01-03 – 2022-01-05 (×3): 10 mg via ORAL
  Filled 2022-01-02 (×3): qty 1

## 2022-01-02 MED ORDER — LINAGLIPTIN 5 MG PO TABS: 5.0000 mg | ORAL_TABLET | Freq: Every day | ORAL | Status: DC

## 2022-01-02 MED ORDER — GABAPENTIN 300 MG PO CAPS: 300.0000 mg | ORAL_CAPSULE | Freq: Three times a day (TID) | ORAL | Status: DC

## 2022-01-02 MED ORDER — ONDANSETRON HCL 4 MG/2ML IJ SOLN: 4.0000 mg | Freq: Four times a day (QID) | INTRAMUSCULAR | Status: DC | PRN

## 2022-01-02 MED ORDER — ALBUTEROL SULFATE (2.5 MG/3ML) 0.083% IN NEBU
2.5000 mg | INHALATION_SOLUTION | Freq: Once | RESPIRATORY_TRACT | Status: AC
  Administered 2022-01-02: 2.5 mg via RESPIRATORY_TRACT
  Filled 2022-01-02: qty 3

## 2022-01-02 NOTE — ED Notes (Signed)
ED Provider at bedside. 

## 2022-01-02 NOTE — ED Notes (Signed)
Lab sts they will add on the PT/INR and APTT

## 2022-01-02 NOTE — ED Notes (Signed)
Entered pts room and she complained of nausea and started vomiting. MD notified and new orders placed.

## 2022-01-02 NOTE — ED Triage Notes (Signed)
Pt in via [facility] from home, reports having Shingles Vaccine 9/5, having symptoms of reaction following, worsening tonight w/ shortness of breath, dry cough.  Patient 83% on room air; triaged at this time.  EDP, Siadecki notified of patient presentation.

## 2022-01-02 NOTE — Consult Note (Signed)
ANTICOAGULATION CONSULT NOTE  Pharmacy Consult for Heparin Indication: chest pain/ACS  Allergies  Allergen Reactions   Ibuprofen Other (See Comments)    Bleeding stomach   Levemir [Insulin Detemir] Other (See Comments)    Injection site reaction, bruise, knot up   Victoza [Liraglutide] Other (See Comments)    Almost went blind    Patient Measurements: Height: 5\' 3"  (160 cm) Weight: 82.6 kg (182 lb) IBW/kg (Calculated) : 52.4 Heparin Dosing Weight: 70.6 kg  Vital Signs: Temp: 99.2 F (37.3 C) (09/08 1959) Temp Source: Oral (09/08 1959) BP: 126/82 (09/08 2045) Pulse Rate: 118 (09/08 2045)  Labs: Recent Labs    01/02/22 2016  HGB 15.2*  HCT 45.4  PLT 162  CREATININE 0.67  TROPONINIHS 238*    Estimated Creatinine Clearance: 74.2 mL/min (by C-G formula based on SCr of 0.67 mg/dL).   Medical History: Past Medical History:  Diagnosis Date   Anxiety    Arthritis    Asthma    Depression    Diabetes mellitus without complication (HCC)    GERD (gastroesophageal reflux disease)    no meds   Heart murmur    History of kidney stones    h/o   Hypertension    Myocardial infarction [facility]) 2015   Pneumonia 2014   Seizures (HCC)    as a child-no since age 61   Severe aortic stenosis     Medications:  No history of chronic AC use PTA  Assessment: Pharmacy has been consulted for heparin in 61yo female presenting to ED with SOB and dry cough. Abnormal EKG noted with critical troponin level of 238 recorded. Baseline labs have been ordered and are pending.   Goal of Therapy:  Heparin level 0.3-0.7 units/ml Monitor platelets by anticoagulation protocol: Yes   Plan:  Give 4000 units bolus x 1 Start heparin infusion at 850 units/hr Check anti-Xa level in 6 hours and daily while on heparin Continue to monitor H&H and platelets  Janean Eischen A Bradie Sangiovanni 01/02/2022,9:39 PM

## 2022-01-02 NOTE — ED Provider Notes (Signed)
[facility] Provider Note    Event Date/Time   First MD Initiated Contact with Patient 01/02/22 1954     (approximate)   History   Allergic Reaction   HPI  Nancy [name] is a 61 y.o. female with history of asthma, aortic stenosis, CAD with prior MI, hypertension, hyperlipidemia, and type 2 diabetes who presents with shortness of breath over the last several days, gradual onset, associated with low-grade fever as well as with a dry cough.  The patient reports mild chest discomfort.  The symptoms started after she received a shingles vaccine on 9/5.  She also reports some itching and swelling around her face and believes she is having an allergic reaction.   Physical Exam   Triage Vital Signs: ED Triage Vitals  Enc Vitals Group     BP 01/02/22 1959 (!) 118/90     Pulse Rate 01/02/22 1959 (!) 117     Resp 01/02/22 1959 (!) 24     Temp 01/02/22 1959 99.2 F (37.3 C)     Temp Source 01/02/22 1959 Oral     SpO2 01/02/22 1958 (!) 83 %     Weight 01/02/22 2000 182 lb (82.6 kg)     Height 01/02/22 2000 5\' 3"  (1.6 m)     Head Circumference --      Peak Flow --      Pain Score 01/02/22 1959 8     Pain Loc --      Pain Edu? --      Excl. in GC? --     Most recent vital signs: Vitals:   01/02/22 2200 01/02/22 2315  BP: 130/88   Pulse: (!) 121 (!) 124  Resp: 18   Temp:    SpO2: 91% 93%    General: And oriented, no acute distress. CV:  Good peripheral perfusion.  Resp:  Respiratory effort.  Lungs CTAB. Abd:  No distention.  Other:  No significant lower extremity edema.   ED Results / Procedures / Treatments   Labs (all labs ordered are listed, but only abnormal results are displayed) Labs Reviewed  COMPREHENSIVE METABOLIC PANEL - Abnormal; Notable for the following components:      Result Value   Glucose, Bld 218 (*)    Calcium 8.6 (*)    All other components within normal limits  CBC WITH DIFFERENTIAL/PLATELET - Abnormal; Notable for  the following components:   RBC 5.12 (*)    Hemoglobin 15.2 (*)    All other components within normal limits  BRAIN NATRIURETIC PEPTIDE - Abnormal; Notable for the following components:   B Natriuretic Peptide 757.2 (*)    All other components within normal limits  LACTIC ACID, PLASMA - Abnormal; Notable for the following components:   Lactic Acid, Venous 2.0 (*)    All other components within normal limits  URINALYSIS, ROUTINE W REFLEX MICROSCOPIC - Abnormal; Notable for the following components:   Color, Urine STRAW (*)    APPearance CLEAR (*)    Specific Gravity, Urine 1.031 (*)    Glucose, UA >=500 (*)    All other components within normal limits  TROPONIN I (HIGH SENSITIVITY) - Abnormal; Notable for the following components:   Troponin I (High Sensitivity) 238 (*)    All other components within normal limits  TROPONIN I (HIGH SENSITIVITY) - Abnormal; Notable for the following components:   Troponin I (High Sensitivity) 197 (*)    All other components within normal limits  RESP PANEL BY  RT-PCR (FLU A&B, COVID) ARPGX2  LACTIC ACID, PLASMA  APTT  PROTIME-INR  HEPARIN LEVEL (UNFRACTIONATED)     EKG  ED ECG REPORT I, Dionne Bucy, the attending physician, personally viewed and interpreted this ECG.  Date: 01/02/2022 EKG Time: 2003 Rate: 118 Rhythm: Sinus tachycardia QRS Axis: normal Intervals: Nonspecific IVCD ST/T Wave abnormalities: Nonspecific ST abnormalities inferior Narrative Interpretation: no evidence of acute ischemia   ED ECG REPORT I, Dionne Bucy, the attending physician, personally viewed and interpreted this ECG.  Date: 01/02/2022 EKG Time: 2113 Rate: 119 Rhythm: Sinus tachycardia QRS Axis: normal Intervals: Nonspecific IVCD ST/T Wave abnormalities: None specific ST abnormalities inferior and lateral Narrative Interpretation: no evidence of acute ischemia; no dynamic change when compared to EKG of 2003 today     RADIOLOGY  Chest  x-ray: I independently viewed and interpreted the images; there are bilateral interstitial opacities concerning for edema  PROCEDURES:  Critical Care performed: Yes  .Critical Care  Performed by: Dionne Bucy, MD Authorized by: Dionne Bucy, MD   Critical care provider statement:    Critical care time (minutes):  30   Critical care was necessary to treat or prevent imminent or life-threatening deterioration of the following conditions:  Cardiac failure   Critical care was time spent personally by me on the following activities:  Development of treatment plan with patient or surrogate, discussions with consultants, evaluation of patient's response to treatment, examination of patient, ordering and review of laboratory studies, ordering and review of radiographic studies, ordering and performing treatments and interventions, pulse oximetry, re-evaluation of patient's condition, review of old charts and obtaining history from patient or surrogate   Care discussed with: admitting provider      MEDICATIONS ORDERED IN ED: Medications  heparin ADULT infusion 100 units/mL (25000 units/242mL) (850 Units/hr Intravenous New Bag/Given 01/02/22 2159)  sodium chloride 0.9 % bolus 500 mL (0 mLs Intravenous Stopped 01/02/22 2100)  furosemide (LASIX) injection 40 mg (40 mg Intravenous Given 01/02/22 2202)  albuterol (PROVENTIL) (2.5 MG/3ML) 0.083% nebulizer solution 2.5 mg (2.5 mg Nebulization Given 01/02/22 2200)  heparin bolus via infusion 4,000 Units (4,000 Units Intravenous Bolus from Bag 01/02/22 2200)  ondansetron (ZOFRAN) injection 4 mg (4 mg Intravenous Given 01/02/22 2254)     IMPRESSION / MDM / ASSESSMENT AND PLAN / ED COURSE  I reviewed the triage vital signs and the nursing notes.  61 year old female with PMH as noted above presents with shortness of breath and low-grade temperature over the last few days after getting a shingles vaccine.  I reviewed the past medical records.  The  patient follows with Dr. Gwen Pounds from cardiology and was most recently seen on 5/4.  She had an echo in April which showed aortic stenosis but a normal EF.    On exam the patient shows increased work of breathing but no rales or wheezes.  There is no significant peripheral edema.   O2 saturation was in the low 80s on room air.  Differential diagnosis includes, but is not limited to, acute bronchitis, pneumonia, COVID or other viral syndrome, less likely CHF or other cardiac cause.  There is no clinical evidence for allergic reaction.    Patient's presentation is most consistent with acute presentation with potential threat to life or bodily function.  We will obtain chest X-ray, lab workup, give fluid bolus, and reassess.   The patient is on the cardiac monitor to evaluate for evidence of arrhythmia and/or significant heart rate changes.  ----------------------------------------- 11:30 PM on 01/02/2022 -----------------------------------------  Chest x-ray revealed bilateral opacities consistent with interstitial edema.  BNP and troponin were both elevated.  EKG shows nonspecific ST abnormalities.  The patient does report active chest discomfort.  Given these findings I started her on heparin infusion.  She became more short of breath after the initial small fluid bolus although continued to have no rales on exam.  Given the findings from the work-up I started IV Lasix and we increased the oxygen.  Overall presentation is consistent with new onset CHF versus possible NSTEMI rather than infectious etiology.  Based on the edema seen on the x-ray and the lab work-up, findings are suggestive of CHF rather than PE.  The patient has no DVT symptoms.  The patient is currently much more comfortable appearing.  She is on 4 L O2 by nasal cannula.  I consulted Dr. Arville Care from the hospitalist service; based on our discussion he agrees to admit the patient.  FINAL CLINICAL IMPRESSION(S) / ED DIAGNOSES    Final diagnoses:  Acute respiratory failure with hypoxia (HCC)  Acute congestive heart failure, unspecified heart failure type (HCC)     Rx / DC Orders   ED Discharge Orders     None        Note:  This document was prepared using Dragon voice recognition software and may include unintentional dictation errors.    Dionne Bucy, MD 01/02/22 2333

## 2022-01-03 ENCOUNTER — Encounter: Payer: Self-pay | Admitting: Family Medicine

## 2022-01-03 ENCOUNTER — Inpatient Hospital Stay
Admit: 2022-01-03 | Discharge: 2022-01-03 | Disposition: A | Discharge status: Home / Self Care | Payer: Medicaid Other | Attending: Family Medicine | Admitting: Family Medicine

## 2022-01-03 DIAGNOSIS — J9601 Acute respiratory failure with hypoxia: Secondary | ICD-10-CM | POA: Diagnosis not present

## 2022-01-03 DIAGNOSIS — R778 Other specified abnormalities of plasma proteins: Secondary | ICD-10-CM | POA: Diagnosis not present

## 2022-01-03 DIAGNOSIS — I10 Essential (primary) hypertension: Secondary | ICD-10-CM

## 2022-01-03 DIAGNOSIS — I509 Heart failure, unspecified: Secondary | ICD-10-CM | POA: Diagnosis not present

## 2022-01-03 DIAGNOSIS — E1142 Type 2 diabetes mellitus with diabetic polyneuropathy: Secondary | ICD-10-CM

## 2022-01-03 LAB — ECHOCARDIOGRAM COMPLETE
AR max vel: 0.34 cm2
AV Area VTI: 0.27 cm2
AV Area mean vel: 0.29 cm2
AV Mean grad: 67.2 mmHg
AV Peak grad: 106.5 mmHg
Ao pk vel: 5.16 m/s
Area-P 1/2: 4.77 cm2
Calc EF: 57.8 %
Height: 63 in
MV M vel: 6.21 m/s
MV Peak grad: 154.3 mmHg
MV VTI: 0.89 cm2
P 1/2 time: 239 msec
S' Lateral: 3.31 cm
Single Plane A2C EF: 56.1 %
Single Plane A4C EF: 60.8 %
Weight: 2912 oz

## 2022-01-03 LAB — BASIC METABOLIC PANEL
Anion gap: 11 (ref 5–15)
BUN: 11 mg/dL (ref 8–23)
CO2: 25 mmol/L (ref 22–32)
Calcium: 8.7 mg/dL — ABNORMAL LOW (ref 8.9–10.3)
Chloride: 102 mmol/L (ref 98–111)
Creatinine, Ser: 0.69 mg/dL (ref 0.44–1.00)
GFR, Estimated: >60 mL/min (ref >60)
Glucose, Bld: 187 mg/dL — ABNORMAL HIGH (ref 70–99)
Potassium: 4 mmol/L (ref 3.5–5.1)
Sodium: 138 mmol/L (ref 135–145)

## 2022-01-03 LAB — CBC
HCT: 43.9 % (ref 36.0–46.0)
Hemoglobin: 14.7 g/dL (ref 12.0–15.0)
MCH: 29.6 pg (ref 26.0–34.0)
MCHC: 33.5 g/dL (ref 30.0–36.0)
MCV: 88.3 fL (ref 80.0–100.0)
Platelets: 180 10*3/uL (ref 150–400)
RBC: 4.97 MIL/uL (ref 3.87–5.11)
RDW: 12.8 % (ref 11.5–15.5)
WBC: 11.5 10*3/uL — ABNORMAL HIGH (ref 4.0–10.5)
nRBC: 0 % (ref 0.0–0.2)

## 2022-01-03 LAB — HEPARIN LEVEL (UNFRACTIONATED)
Heparin Unfractionated: 0.17 IU/mL — ABNORMAL LOW (ref 0.30–0.70)
Heparin Unfractionated: 0.14 IU/mL — ABNORMAL LOW (ref 0.30–0.70)
Heparin Unfractionated: 0.26 IU/mL — ABNORMAL LOW (ref 0.30–0.70)

## 2022-01-03 LAB — GLUCOSE, CAPILLARY
Glucose-Capillary: 129 mg/dL — ABNORMAL HIGH (ref 70–99)
Glucose-Capillary: 179 mg/dL — ABNORMAL HIGH (ref 70–99)

## 2022-01-03 LAB — CBG MONITORING, ED
Glucose-Capillary: 189 mg/dL — ABNORMAL HIGH (ref 70–99)
Glucose-Capillary: 165 mg/dL — ABNORMAL HIGH (ref 70–99)

## 2022-01-03 LAB — TROPONIN I (HIGH SENSITIVITY): Troponin I (High Sensitivity): 215 ng/L (ref <18)

## 2022-01-03 LAB — HEMOGLOBIN A1C
Hgb A1c MFr Bld: 8.4 % — ABNORMAL HIGH (ref 4.8–5.6)
Mean Plasma Glucose: 194.38 mg/dL

## 2022-01-03 MED ORDER — ALBUTEROL SULFATE (2.5 MG/3ML) 0.083% IN NEBU: 2.5000 mg | INHALATION_SOLUTION | Freq: Four times a day (QID) | RESPIRATORY_TRACT | Status: DC | PRN

## 2022-01-03 MED ORDER — HEPARIN BOLUS VIA INFUSION
2100.0000 [IU] | Freq: Once | INTRAVENOUS | Status: AC
Start: 2022-01-03 — End: 2022-01-03
  Administered 2022-01-03: 2100 [IU] via INTRAVENOUS
  Filled 2022-01-03: qty 2100

## 2022-01-03 MED ORDER — INSULIN ASPART 100 UNIT/ML IJ SOLN
0.0000 [IU] | Freq: Three times a day (TID) | INTRAMUSCULAR | Status: DC
  Administered 2022-01-03: 1 [IU] via SUBCUTANEOUS
  Administered 2022-01-03 (×2): 2 [IU] via SUBCUTANEOUS
  Administered 2022-01-04: 1 [IU] via SUBCUTANEOUS
  Filled 2022-01-03 (×4): qty 1

## 2022-01-03 MED ORDER — HEPARIN BOLUS VIA INFUSION
1100.0000 [IU] | Freq: Once | INTRAVENOUS | Status: AC
Start: 2022-01-03 — End: 2022-01-03
  Administered 2022-01-03: 1100 [IU] via INTRAVENOUS
  Filled 2022-01-03: qty 1100

## 2022-01-03 MED ORDER — SODIUM CHLORIDE 0.9% FLUSH
3.0000 mL | Freq: Two times a day (BID) | INTRAVENOUS | Status: DC
  Administered 2022-01-03 – 2022-01-05 (×4): 3 mL via INTRAVENOUS

## 2022-01-03 NOTE — Progress Notes (Signed)
*  PRELIMINARY RESULTS* Echocardiogram 2D Echocardiogram has been performed.  Nancy [name] 01/03/2022, 12:48 PM

## 2022-01-03 NOTE — Assessment & Plan Note (Addendum)
-   The patient has subsequent acute hypoxic respiratory failure.  I suspect diastolic etiology. - The patient will be admitted to a progressive unit bed. - We will continue diuresis with IV Lasix. - We will follow serial troponins. - 2D echo and cardiology consult be obtained specially given elevated troponin. - Cardiology consult will be obtained. - I notified Dr. Gwen Pounds about the patient.

## 2022-01-03 NOTE — Progress Notes (Addendum)
Brief Progress Note (See full H&P from earlier today)     Nancy [name] is a 61 y.o. female with medical history significant for anxiety, depression, type 2 diabetes mellitus, hypertension,, presented to the emergency room with acute onset of worsening dyspnea with associated dry cough and wheezing.  She admitted to dyspnea on exertion.  She has lower extremity edema without significant worsening.  She was having left sided chest pain felt as pressure without nausea or vomiting or diaphoresis 09/08 - early admission 09/09: Hypertensive, tachycardic, tachypneic, hypoxic 83% on room air to 93% on 4 L O2 Scalp Level.  Troponin 238 trended down to 197.  Lactic acid 2 trended down to 1.5.  CXR showed pulmonary edema.  Given Lasix IV 40 mg, started on IV heparin with bolus.  BNP 757.  Admitted for acute CHF, elevated troponin concern for NSTEMI versus demand ischemia from acute CHF.  Serial troponins ordered, echocardiogram ordered, cardiology consulted, Dr. Gwen Pounds notified by admitting physician. 09/09: Oxygenating at 95% on room air, still tachycardic and mild tachypnea at 21 RR.  Slight elevation WBC to 11.5. Net IO Since Admission as documented: -594.06 mL [01/03/22 0732].  Confirmed with Dr. Gwen Pounds he will see her this afternoon.   Consultants:  Cardiology  Procedures: none      ASSESSMENT & PLAN:   Principal Problem:   Acute CHF (congestive heart failure) (HCC) Active Problems:   Elevated troponin I level   Type 2 diabetes mellitus with peripheral neuropathy (HCC)   Essential hypertension   Acute CHF (congestive heart failure) (HCC) suspect diastolic etiology, echo pending Acute hypoxic respiratory failure due to CHF pulmonary edema Essential hypertension Elevated troponin I level d/t non-STEMI, vs demand ischemia from acute CHF given improvement of levels. Meeting SIRS criteria without source of infection, sepsis ruled out IV Lasix. serial troponins. 2D echo  cardiology  consult, admitting hospitalist notified Dr. Gwen Pounds about the patient - confirmed w/ him he will see pt this afternoon 09/09 Supplemental O2 to wean as tolerated IV heparin pending cardiology recs - troponin is trending down, if continuing to trend down should be okay to DC heparin. Nitro and morphine PRN continuing Toprol-XL high-dose statin therapy Aspirin Home antihypertensives also include amlodipine, losartan She is on Jardiance but will continue Strict I&O, daily weights  Type 2 diabetes mellitus with peripheral neuropathy (HCC) supplemental coverage with NovoLog and will continue her basal coverage. Jardiance and Onglyza. Neurontin.       DVT prophylaxis: IV heparin may be able to discontinue and just have on regular DVT prophylaxis, if troponins continue to trend down/cardiology clearance Pertinent IV fluids/nutrition: No IV fluids, cardiac/carb diet Central lines / invasive devices: None  Code Status: DNR was confirmed by admitting hospitalist Family Communication: husband at bedside on rounds.   Disposition: Inpatient for now [facility] needs: None at this time, pending clinical course Barriers to discharge / significant pending items: Cardiology consult, treatment CHF exacerbation, O2 requirement      Subjective: Patient is feeling well, states she is doing a lot better compared to when she first got to the ED.  Still on oxygen though.   Objective: Physical Exam:  BP 114/87   Pulse (!) 106   Temp 98.6 F (37 C) (Oral)   Resp 18   Ht 5\' 3"  (1.6 m)   Wt 82.6 kg   LMP 09/14/2007 (Approximate)   SpO2 94%   BMI 32.24 kg/m  Constitutional:  General Appearance: alert, well-developed, well-nourished, NAD Respiratory: Normal respiratory effort Breath  sounds normal, no wheeze/rhonchi/rales Cardiovascular: S1/S2 normal, + murmur no rub/gallop auscultated No lower extremity edema Gastrointestinal: Nontender, no masses Musculoskeletal:  No clubbing/cyanosis of  digits Neurological: No cranial nerve deficit on limited exam Psychiatric: Normal judgment/insight Normal mood and affect

## 2022-01-03 NOTE — H&P (Addendum)
[place]   PATIENT NAME: Nancy [name]    MR#:  [id number]  DATE OF BIRTH:  25-Jul-1960  DATE OF ADMISSION:  01/02/2022  PRIMARY CARE PHYSICIAN: Hillery Aldo, MD   Patient is coming from: Home  REQUESTING/REFERRING PHYSICIAN: Miki Kins, MD  CHIEF COMPLAINT:   Chief Complaint  Patient presents with   Allergic Reaction    HISTORY OF PRESENT ILLNESS:  Nancy [name] is a 61 y.o. female with medical history significant for anxiety, depression, type 2 diabetes mellitus, hypertension,, presented to the emergency room with acute onset of worsening dyspnea with associated dry cough and wheezing.  She admitted to dyspnea on exertion.  She has lower extremity edema without significant worsening.  She was having left sided chest pain felt as pressure without nausea or vomiting or diaphoresis.  She admitted to tactile fever and chills but did not have any measured fever.  No dysuria, oliguria or hematuria or flank pain.  ED Course: When she came to the ER, blood pressure was 180/90 with heart rate of 117 and respiratory rate of 24.  Pulse 70 with 83% on room air and later 93% on 4 L of O2 by nasal cannula.  Labs revealed a troponin of 238 and later 197, lactic acid 2 and later 1.5, hemoconcentration on CBC and her CMP revealed blood glucose of 218.  Influenza antigens and COVID-19 PCR came back negative.  UA showed more than 500 glucose. EKG as reviewed by me : EKG showed sinus tachycardia with rate of 2019 with Q waves anteroseptally and T wave inversion laterally. Imaging: Portable chest ray showed interstitial pulmonary edema.  The patient was given 40 mg of IV Lasix and was started on IV heparin with bolus and drip as well as given 4 mg of IV Zofran and albuterol.  She will be admitted to a progressive unit bed for further evaluation and management. PAST MEDICAL HISTORY:   Past Medical History:  Diagnosis Date   Anxiety    Arthritis    Asthma    Depression     Diabetes mellitus without complication (HCC)    GERD (gastroesophageal reflux disease)    no meds   Heart murmur    History of kidney stones    h/o   Hypertension    Myocardial infarction (HCC) 2015   Pneumonia 2014   Seizures (HCC)    as a child-no since age 61   Severe aortic stenosis     PAST SURGICAL HISTORY:   Past Surgical History:  Procedure Laterality Date   CHOLECYSTECTOMY     HEMORRHOID SURGERY N/A 10/07/2017   Procedure: HEMORRHOIDECTOMY;  Surgeon: Carolan Shiver, MD;  Location: [facility] ORS;  Service: General;  Laterality: N/A;   TUBAL LIGATION  1987    SOCIAL HISTORY:   Social History   Tobacco Use   Smoking status: Former    Packs/day: 0.25    Years: 13.00    Total pack years: 3.25    Types: Cigarettes   Smokeless tobacco: Never   Tobacco comments:    age [age]  Substance Use Topics   Alcohol use: Yes    Comment: occ    FAMILY HISTORY:   Family History  Problem Relation Age of Onset   Breast cancer Maternal Aunt 47    DRUG ALLERGIES:   Allergies  Allergen Reactions   Ibuprofen Other (See Comments)    Bleeding stomach   Levemir [Insulin Detemir] Other (See Comments)    Injection site  reaction, bruise, knot up   Victoza [Liraglutide] Other (See Comments)    Almost went blind    REVIEW OF SYSTEMS:   ROS As per history of present illness. All pertinent systems were reviewed above. Constitutional, HEENT, cardiovascular, respiratory, GI, GU, musculoskeletal, neuro, psychiatric, endocrine, integumentary and hematologic systems were reviewed and are otherwise negative/unremarkable except for positive findings mentioned above in the HPI.   MEDICATIONS AT HOME:   Prior to Admission medications   Medication Sig Start Date End Date Taking? Authorizing Provider  acetaminophen-codeine (TYLENOL #3) 300-30 MG tablet Take 2 tablets by mouth at bedtime. 08/27/17   [provider]  albuterol (PROVENTIL HFA;VENTOLIN HFA) 108 (90 Base) MCG/ACT  inhaler Inhale 2 puffs into the lungs every 6 (six) hours as needed for wheezing or shortness of breath.    [provider]  amLODipine (NORVASC) 10 MG tablet Take 10 mg by mouth every morning.  08/29/17   [provider]  amoxicillin (AMOXIL) 500 MG capsule Take 500 mg by mouth 2 (two) times daily.    [provider]  aspirin EC 81 MG tablet Take 81 mg by mouth daily.    [provider]  CINNAMON PO Take 2 capsules by mouth 2 (two) times daily.    [provider]  diclofenac sodium (VOLTAREN) 1 % GEL Apply 1 application topically daily as needed (for pain).    [provider]  gabapentin (NEURONTIN) 300 MG capsule Take 300 mg by mouth at bedtime. 08/29/17   [provider]  gabapentin (NEURONTIN) 300 MG capsule Take 1 capsule (300 mg total) by mouth 3 (three) times daily for 21 days. 10/07/17 10/28/17  Herbert Pun, MD  GARCINIA CAMBOGIA-CHROMIUM PO Take 2 capsules by mouth daily.    [provider]  JARDIANCE 10 MG TABS tablet Take 10 mg by mouth daily. 09/05/17   [provider]  LANTUS SOLOSTAR 100 UNIT/ML Solostar Pen Inject 48 Units into the skin at bedtime. 09/05/17   [provider]  losartan (COZAAR) 100 MG tablet Take 100 mg by mouth every morning.  07/07/17   [provider]  methocarbamol (ROBAXIN) 500 MG tablet Take 500 mg by mouth every 6 (six) hours as needed for muscle spasms. 07/14/17   [provider]  metoprolol succinate (TOPROL-XL) 50 MG 24 hr tablet Take 50 mg by mouth every morning.  09/05/17   [provider]  ONGLYZA 2.5 MG TABS tablet Take 2.5 mg by mouth 2 (two) times daily. 09/05/17   [provider]  Probiotic Product (PROBIOTIC PO) Take 1 capsule by mouth daily.    [provider]  SYMBICORT 160-4.5 MCG/ACT inhaler Inhale 2 puffs into the lungs 2 (two) times daily as needed. 09/05/17   [provider]  TURMERIC PO Take 1 capsule by  mouth daily.    [provider]      VITAL SIGNS:  Blood pressure (!) 138/90, pulse (!) 119, temperature 99.2 F (37.3 C), temperature source Oral, resp. rate 18, height 5\' 3"  (1.6 m), weight 82.6 kg, last menstrual period 09/14/2007, SpO2 92 %.  PHYSICAL EXAMINATION:  Physical Exam  GENERAL:  61 y.o.-year-old female patient lying in the bed with no acute distress.  EYES: Pupils equal, round, reactive to light and accommodation. No scleral icterus. Extraocular muscles intact.  HEENT: Head atraumatic, normocephalic. Oropharynx and nasopharynx clear.  NECK:  Supple, no jugular venous distention. No thyroid enlargement, no tenderness.  LUNGS: Decreased bibasilar breath sounds with bibasal rales.  No use of accessory muscles of respiration.  CARDIOVASCULAR: Regular rate and rhythm, S1, S2 normal. No murmurs, rubs, or gallops.  ABDOMEN: Soft, nondistended, nontender. Bowel sounds present. No organomegaly or mass.  EXTREMITIES: 1-2+ bilateral lower extremity pitting edema with no cyanosis, or clubbing.  NEUROLOGIC: Cranial nerves II through XII are intact. Muscle strength 5/5 in all extremities. Sensation intact. Gait not checked.  PSYCHIATRIC: The patient is alert and oriented x 3.  Normal affect and good eye contact. SKIN: No obvious rash, lesion, or ulcer.   LABORATORY PANEL:   CBC Recent Labs  Lab 01/02/22 2016  WBC 9.7  HGB 15.2*  HCT 45.4  PLT 162   ------------------------------------------------------------------------------------------------------------------  Chemistries  Recent Labs  Lab 01/02/22 2016  NA 137  K 4.1  CL 103  CO2 24  GLUCOSE 218*  BUN 12  CREATININE 0.67  CALCIUM 8.6*  AST 21  ALT 19  ALKPHOS 64  BILITOT 0.7   ------------------------------------------------------------------------------------------------------------------  Cardiac Enzymes No results for input(s): "TROPONINI" in the last 168  hours. ------------------------------------------------------------------------------------------------------------------  RADIOLOGY:  DG Chest Port 1 View  Result Date: 01/02/2022 CLINICAL DATA:  Shortness of breath and dry cough EXAM: PORTABLE CHEST 1 VIEW COMPARISON:  Radiographs 12/25/2013 FINDINGS: Increased interstitial prominence bilaterally with some airspace opacities in the lower lungs. Heart size at the upper limits of normal. No pleural effusion or pneumothorax. No acute osseous abnormality. IMPRESSION: Findings suggestive of interstitial pulmonary edema with atypical infection considered less likely. Electronically Signed   By: Minerva Fester M.D.   On: 01/02/2022 20:54      IMPRESSION AND PLAN:  Assessment and Plan: * Acute CHF (congestive heart failure) (HCC) - The patient has subsequent acute hypoxic respiratory failure.  I suspect diastolic etiology. - The patient will be admitted to a progressive unit bed. - We will continue diuresis with IV Lasix. - We will follow serial troponins. - 2D echo and cardiology consult be obtained specially given elevated troponin. - Cardiology consult will be obtained. - I notified Dr. Gwen Pounds about the patient.  Elevated troponin I level - While this could be related to non-STEMI, could be demand ischemia from acute CHF given improvement of levels. - We will follow serial troponins and obtain 2D echo and cardiology consult as mentioned above. - We will continue the patient on aspirin as well as IV heparin and placed on as needed sublingual nitroglycerin and IV morphine sulfate for pain in addition to continuing Toprol-XL. - She will be placed on high-dose statin therapy.  Essential hypertension - We will continue her antihypertensives.  Type 2 diabetes mellitus with peripheral neuropathy (HCC) - The patient will be placed on supplemental coverage with NovoLog and will continue her basal coverage. - We will continue her Jardiance and  Onglyza. - We will continue her Neurontin.       DVT prophylaxis: IV heparin Advanced Care Planning:  Code Status: DNR/DNI.  This was discussed with her. Family Communication:  The plan of care was discussed in details with the patient (and family). I answered all questions. The patient agreed to proceed with the above mentioned plan. Further management will depend upon hospital course. Disposition Plan: Back to previous home environment Consults called: Cardiology consult All the records are reviewed and case discussed with ED provider.  Status is: Inpatient    At the time of the admission, it appears that the appropriate admission status for this patient is inpatient.  This is judged to be reasonable and necessary in order  to provide the required intensity of service to ensure the patient's safety given the presenting symptoms, physical exam findings and initial radiographic and laboratory data in the context of comorbid conditions.  The patient requires inpatient status due to high intensity of service, high risk of further deterioration and high frequency of surveillance required.  I certify that at the time of admission, it is my clinical judgment that the patient will require inpatient hospital care extending more than 2 midnights.                            Dispo: The patient is from: Home              Anticipated d/c is to: Home              Patient currently is not medically stable to d/c.              Difficult to place patient: No  Christel Mormon M.D on 01/03/2022 at 4:00 AM  [facility]   From 7 PM-7 AM, contact night-coverage [email or web address]  CC: Primary care physician; Denton Lank, MD

## 2022-01-03 NOTE — Consult Note (Signed)
ANTICOAGULATION CONSULT NOTE  Pharmacy Consult for Heparin Indication: chest pain/ACS  Allergies  Allergen Reactions   Ibuprofen Other (See Comments)    Bleeding stomach   Levemir [Insulin Detemir] Other (See Comments)    Injection site reaction, bruise, knot up   Victoza [Liraglutide] Other (See Comments)    Almost went blind    Patient Measurements: Height: 5\' 3"  (160 cm) Weight: 82.6 kg (182 lb) IBW/kg (Calculated) : 52.4 Heparin Dosing Weight: 70.6 kg  Vital Signs: Temp: 99.3 F (37.4 C) (09/09 1923) Temp Source: Oral (09/09 1000) BP: 91/77 (09/09 1923) Pulse Rate: 96 (09/09 1923)  Labs: Recent Labs    01/02/22 2016 01/02/22 2017 01/02/22 2155 01/03/22 0456 01/03/22 1321 01/03/22 2001  HGB 15.2*  --   --  14.7  --   --   HCT 45.4  --   --  43.9  --   --   PLT 162  --   --  180  --   --   APTT  --  28  --   --   --   --   LABPROT  --  14.1  --   --   --   --   INR  --  1.1  --   --   --   --   HEPARINUNFRC  --   --   --  0.17* 0.14* 0.26*  CREATININE 0.67  --   --  0.69  --   --   TROPONINIHS 238*  --  197*  --  215*  --      Estimated Creatinine Clearance: 74.2 mL/min (by C-G formula based on SCr of 0.69 mg/dL).   Medical History: Past Medical History:  Diagnosis Date   Anxiety    Arthritis    Asthma    Depression    Diabetes mellitus without complication (HCC)    GERD (gastroesophageal reflux disease)    no meds   Heart murmur    History of kidney stones    h/o   Hypertension    Myocardial infarction [facility]) 2015   Pneumonia 2014   Seizures (HCC)    as a child-no since age 21   Severe aortic stenosis     Medications:  No history of chronic AC use PTA  Assessment: Pharmacy has been consulted for heparin in 61yo female presenting to ED with SOB and dry cough. Abnormal EKG noted with critical troponin level of 238 recorded. Baseline labs have been ordered and are pending.   Goal of Therapy:  Heparin level 0.3-0.7 units/ml Monitor  platelets by anticoagulation protocol: Yes   9/09 0456 HL  0.17, subtherapeutic 9/09 1321 HL  0.14, subtherapeutic 9/09 2001 HL 0.26, subtherapeutic  Plan:  Give 1100 units bolus x 1 Increase heparin infusion at 1450 units/hr Recheck HL in w/ AM labs after rate change Continue to monitor H&H and platelets  11/09, PharmD, [facility] 01/03/2022 9:47 PM

## 2022-01-03 NOTE — Assessment & Plan Note (Addendum)
-   While this could be related to non-STEMI, could be demand ischemia from acute CHF given improvement of levels. - We will follow serial troponins and obtain 2D echo and cardiology consult as mentioned above. - We will continue the patient on aspirin as well as IV heparin and placed on as needed sublingual nitroglycerin and IV morphine sulfate for pain in addition to continuing Toprol-XL. - She will be placed on high-dose statin therapy.

## 2022-01-03 NOTE — Consult Note (Signed)
[facility] Cardiology Consultation Note  Patient ID: Nancy [name], MRN: [id number], DOB/AGE: Feb 18, 1961 61 y.o. Admit date: 01/02/2022   Date of Consult: 01/03/2022 Primary Physician: Hillery Aldo, MD Primary Cardiologist: Gwen Pounds  Chief Complaint:  Chief Complaint  Patient presents with   Allergic Reaction   Reason for Consult:  Aortic stenosis  HPI: 61 y.o. female with known severe aortic valve stenosis anemia diabetes hypertension hyperlipidemia with new onset weakness fatigue and severe shortness of breath.  Chest x-ray has shown pulmonary edema with a troponin of 215/197/238.  This is most consistent with demand ischemia rather than acute coronary syndrome.  The patient has had severe aortic stenosis by echocardiogram which is revealed by echocardiogram today as well.  There is severe aortic stenosis likely causing significant symptoms and currently will need further evaluation and treatment options.  We have continued medication management for her hypertension diabetes hyperlipidemia and current congestive heart failure for which she has had a significant improvement in symptoms.  Past Medical History:  Diagnosis Date   Anxiety    Arthritis    Asthma    Depression    Diabetes mellitus without complication (HCC)    GERD (gastroesophageal reflux disease)    no meds   Heart murmur    History of kidney stones    h/o   Hypertension    Myocardial infarction [facility]) 2015   Pneumonia 2014   Seizures (HCC)    as a child-no since age 67   Severe aortic stenosis       Surgical History:  Past Surgical History:  Procedure Laterality Date   CHOLECYSTECTOMY     HEMORRHOID SURGERY N/A 10/07/2017   Procedure: HEMORRHOIDECTOMY;  Surgeon: Carolan Shiver, MD;  Location: [facility] ORS;  Service: General;  Laterality: N/A;   TUBAL LIGATION  1987     Home Meds: Prior to Admission medications   Medication Sig Start Date End Date Taking? Authorizing Provider  amLODipine (NORVASC)  10 MG tablet Take 10 mg by mouth every morning.  08/29/17  Yes [provider]  aspirin EC 81 MG tablet Take 81 mg by mouth daily.   Yes [provider]  CINNAMON PO Take 2 capsules by mouth 2 (two) times daily.   Yes [provider]  diclofenac sodium (VOLTAREN) 1 % GEL Apply 1 application topically daily as needed (for pain).   Yes [provider]  gabapentin (NEURONTIN) 300 MG capsule Take 300 mg by mouth at bedtime. 08/29/17  Yes [provider]  GARCINIA CAMBOGIA-CHROMIUM PO Take 2 capsules by mouth daily.   Yes [provider]  JARDIANCE 25 MG TABS tablet Take 25 mg by mouth daily. 12/22/21  Yes [provider]  LANTUS SOLOSTAR 100 UNIT/ML Solostar Pen Inject 38 Units into the skin at bedtime. 09/05/17  Yes [provider]  losartan (COZAAR) 100 MG tablet Take 100 mg by mouth every morning.  07/07/17  Yes [provider]  metoprolol succinate (TOPROL-XL) 50 MG 24 hr tablet Take 50 mg by mouth every morning.  09/05/17  Yes [provider]  montelukast (SINGULAIR) 10 MG tablet Take 10 mg by mouth daily. 10/06/21  Yes [provider]  nystatin cream (MYCOSTATIN) Apply topically. 12/25/21  Yes [provider]  Probiotic Product (PROBIOTIC PO) Take 1 capsule by mouth daily.   Yes [provider]  SILVADENE 1 % cream SMARTSIG:sparingly Topical Twice Daily PRN 12/25/21  Yes [provider]  SYMBICORT 160-4.5 MCG/ACT inhaler Inhale 2 puffs into  the lungs 2 (two) times daily as needed. 09/05/17  Yes [provider]  TURMERIC PO Take 1 capsule by mouth daily.   Yes [provider]  acetaminophen-codeine (TYLENOL #3) 300-30 MG tablet Take 2 tablets by mouth at bedtime. 08/27/17   [provider]  albuterol (PROVENTIL HFA;VENTOLIN HFA) 108 (90 Base) MCG/ACT inhaler Inhale 2 puffs into the lungs every 6 (six) hours as needed for wheezing or shortness of breath.     [provider]  amoxicillin (AMOXIL) 500 MG capsule Take 500 mg by mouth 2 (two) times daily. Patient not taking: Reported on 01/03/2022    [provider]  gabapentin (NEURONTIN) 300 MG capsule Take 1 capsule (300 mg total) by mouth 3 (three) times daily for 21 days. 10/07/17 10/28/17  Carolan Shiver, MD  JARDIANCE 10 MG TABS tablet Take 10 mg by mouth daily. Patient not taking: Reported on 01/03/2022 09/05/17   [provider]  methocarbamol (ROBAXIN) 500 MG tablet Take 500 mg by mouth every 6 (six) hours as needed for muscle spasms. Patient not taking: Reported on 01/03/2022 07/14/17   [provider]  ONGLYZA 2.5 MG TABS tablet Take 2.5 mg by mouth 2 (two) times daily. Patient not taking: Reported on 01/03/2022 09/05/17   [provider]  OZEMPIC, 2 MG/DOSE, 8 MG/3ML SOPN Inject into the skin. 12/22/21   [provider]    Inpatient Medications:   acidophilus   Oral Daily   amLODipine  10 mg Oral Q breakfast   aspirin EC  81 mg Oral Daily   empagliflozin  10 mg Oral Daily   furosemide  40 mg Intravenous Q12H   gabapentin  300 mg Oral QHS   insulin aspart  0-9 Units Subcutaneous TID WC   insulin glargine-yfgn  30 Units Subcutaneous QHS   losartan  100 mg Oral Q breakfast   metoprolol succinate  50 mg Oral Q breakfast    heparin 1,300 Units/hr (01/03/22 1601)    Allergies:  Allergies  Allergen Reactions   Ibuprofen Other (See Comments)    Bleeding stomach   Levemir [Insulin Detemir] Other (See Comments)    Injection site reaction, bruise, knot up   Victoza [Liraglutide] Other (See Comments)    Almost went blind    Social History   Socioeconomic History   Marital status: Married    Spouse name: Not on file   Number of children: Not on file   Years of education: Not on file   Highest education level: Not on file  Occupational History   Not on file  Tobacco Use   Smoking status: Former    Packs/day: 0.25    Years: 13.00     Total pack years: 3.25    Types: Cigarettes   Smokeless tobacco: Never   Tobacco comments:    age [age]  Vaping Use   Vaping Use: Never used  Substance and Sexual Activity   Alcohol use: Yes    Comment: occ   Drug use: Yes    Types: Marijuana   Sexual activity: Not on file  Other Topics Concern   Not on file  Social History Narrative   Not on file   Social Determinants of Health   Financial Resource Strain: Not on file  Food Insecurity: Not on file  Transportation Needs: Not on file  Physical Activity: Not on file  Stress: Not on file  Social Connections: Not on file  Intimate Partner Violence: Not on file  Family History  Problem Relation Age of Onset   Breast cancer Maternal Aunt 60     Review of Systems Positive for shortness of breath Negative for: General:  chills, fever, night sweats or weight changes.  Cardiovascular: PND orthopnea syncope dizziness  Dermatological skin lesions rashes Respiratory: Cough congestion Urologic: Frequent urination urination at night and hematuria Abdominal: negative for nausea, vomiting, diarrhea, bright red blood per rectum, melena, or hematemesis Neurologic: negative for visual changes, and/or hearing changes  All other systems reviewed and are otherwise negative except as noted above.  Labs: No results for input(s): "CKTOTAL", "CKMB", "TROPONINI" in the last 72 hours. Lab Results  Component Value Date   WBC 11.5 (H) 01/03/2022   HGB 14.7 01/03/2022   HCT 43.9 01/03/2022   MCV 88.3 01/03/2022   PLT 180 01/03/2022    Recent Labs  Lab 01/02/22 2016 01/03/22 0456  NA 137 138  K 4.1 4.0  CL 103 102  CO2 24 25  BUN 12 11  CREATININE 0.67 0.69  CALCIUM 8.6* 8.7*  PROT 7.2  --   BILITOT 0.7  --   ALKPHOS 64  --   ALT 19  --   AST 21  --   GLUCOSE 218* 187*   No results found for: "CHOL", "HDL", "LDLCALC", "TRIG" No results found for: "DDIMER"  Radiology/Studies:  ECHOCARDIOGRAM COMPLETE  Result Date:  01/03/2022    ECHOCARDIOGRAM REPORT   Patient Name:   Nancy [name] Date of Exam: 01/03/2022 Medical Rec #:  [id number]       Height:       63.0 in Accession #:    [id number]      Weight:       182.0 lb Date of Birth:  [birth year]      BSA:          1.858 m Patient Age:    60 years        BP:           120/90 mmHg Patient Gender: F               HR:           105 bpm. Exam Location:  [facility] Procedure: 2D Echo Indications:     CHF Acute Diastolic I50.31  History:         Patient has no prior history of Echocardiogram examinations.  Sonographer:     Overton Mam RDCS Referring Phys:  [id number] Vanessa Kick A MANSY Diagnosing Phys: Arnoldo Hooker MD IMPRESSIONS  1. Left ventricular ejection fraction, by estimation, is 55 to 60%. The left ventricle has normal function. The left ventricle has no regional wall motion abnormalities. There is moderate left ventricular hypertrophy. Left ventricular diastolic parameters are consistent with Grade II diastolic dysfunction (pseudonormalization).  2. Right ventricular systolic function is normal. The right ventricular size is normal.  3. Left atrial size was mildly dilated.  4. The mitral valve is normal in structure. Moderate mitral valve regurgitation.  5. The aortic valve is calcified. Aortic valve regurgitation is trivial. Severe aortic valve stenosis.  6. Aortic dilatation noted. FINDINGS  Left Ventricle: Left ventricular ejection fraction, by estimation, is 55 to 60%. The left ventricle has normal function. The left ventricle has no regional wall motion abnormalities. The left ventricular internal cavity size was normal in size. There is  moderate left ventricular hypertrophy. Left ventricular diastolic parameters are consistent with Grade II diastolic dysfunction (pseudonormalization). Right Ventricle: The right ventricular size is normal. No  increase in right ventricular wall thickness. Right ventricular systolic function is normal. Left Atrium: Left atrial size was mildly dilated.  Right Atrium: Right atrial size was normal in size. Pericardium: There is no evidence of pericardial effusion. Mitral Valve: The mitral valve is normal in structure. Moderate mitral valve regurgitation. MV peak gradient, 14.4 mmHg. The mean mitral valve gradient is 4.0 mmHg. Tricuspid Valve: The tricuspid valve is normal in structure. Tricuspid valve regurgitation is mild. Aortic Valve: The aortic valve is calcified. Aortic valve regurgitation is trivial. Aortic regurgitation PHT measures 239 msec. Severe aortic stenosis is present. Aortic valve mean gradient measures 67.2 mmHg. Aortic valve peak gradient measures 106.5 mmHg. Aortic valve area, by VTI measures 0.27 cm. Pulmonic Valve: The pulmonic valve was normal in structure. Pulmonic valve regurgitation is trivial. Aorta: Aortic dilatation noted. IAS/Shunts: No atrial level shunt detected by color flow Doppler.  LEFT VENTRICLE PLAX 2D LVIDd:         4.71 cm     Diastology LVIDs:         3.31 cm     LV e' medial:    10.00 cm/s LV PW:         2.03 cm     LV E/e' medial:  14.8 LV IVS:        1.60 cm     LV e' lateral:   7.07 cm/s LVOT diam:     1.80 cm     LV E/e' lateral: 20.9 LV SV:         30 LV SV Index:   16 LVOT Area:     2.54 cm  LV Volumes (MOD) LV vol d, MOD A2C: 56.5 ml LV vol d, MOD A4C: 93.4 ml LV vol s, MOD A2C: 24.8 ml LV vol s, MOD A4C: 36.6 ml LV SV MOD A2C:     31.7 ml LV SV MOD A4C:     93.4 ml LV SV MOD BP:      43.3 ml RIGHT VENTRICLE RV Basal diam:  2.72 cm RV S prime:     13.70 cm/s TAPSE (M-mode): 1.7 cm LEFT ATRIUM             Index        RIGHT ATRIUM          Index LA diam:        5.10 cm 2.75 cm/m   RA Area:     5.76 cm LA Vol (A2C):   31.0 ml 16.69 ml/m  RA Volume:   9.02 ml  4.86 ml/m LA Vol (A4C):   50.0 ml 26.92 ml/m LA Biplane Vol: 41.5 ml 22.34 ml/m  AORTIC VALVE                     PULMONIC VALVE AV Area (Vmax):    0.34 cm      PV Vmax:       0.98 m/s AV Area (Vmean):   0.29 cm      PV Peak grad:  3.8 mmHg AV Area (VTI):      0.27 cm AV Vmax:           516.00 cm/s AV Vmean:          386.000 cm/s AV VTI:            1.102 m AV Peak Grad:      106.5 mmHg AV Mean Grad:      67.2 mmHg LVOT Vmax:  69.00 cm/s LVOT Vmean:        44.000 cm/s LVOT VTI:          0.117 m LVOT/AV VTI ratio: 0.11 AI PHT:            239 msec  AORTA Ao Root diam: 2.40 cm Ao Asc diam:  2.90 cm MITRAL VALVE MV Area (PHT): 4.77 cm     SHUNTS MV Area VTI:   0.89 cm     Systemic VTI:  0.12 m MV Peak grad:  14.4 mmHg    Systemic Diam: 1.80 cm MV Mean grad:  4.0 mmHg MV Vmax:       1.90 m/s MV Vmean:      79.8 cm/s MV Decel Time: 159 msec MR Peak grad: 154.3 mmHg MR Mean grad: 91.0 mmHg MR Vmax:      621.00 cm/s MR Vmean:     438.0 cm/s MV E velocity: 148.00 cm/s Arnoldo Hooker MD Electronically signed by Arnoldo Hooker MD Signature Date/Time: 01/03/2022/4:07:01 PM    Final    DG Chest Port 1 View  Result Date: 01/02/2022 CLINICAL DATA:  Shortness of breath and dry cough EXAM: PORTABLE CHEST 1 VIEW COMPARISON:  Radiographs 12/25/2013 FINDINGS: Increased interstitial prominence bilaterally with some airspace opacities in the lower lungs. Heart size at the upper limits of normal. No pleural effusion or pneumothorax. No acute osseous abnormality. IMPRESSION: Findings suggestive of interstitial pulmonary edema with atypical infection considered less likely. Electronically Signed   By: Minerva Fester M.D.   On: 01/02/2022 20:54    EKG: Normal sinus rhythm with lateral ST depression  Weights: Filed Weights   01/02/22 2000  Weight: 82.6 kg     Physical Exam: Blood pressure 114/88, pulse (!) 111, temperature 98.6 F (37 C), temperature source Oral, resp. rate 18, height 5\' 3"  (1.6 m), weight 82.6 kg, last menstrual period 09/14/2007, SpO2 100 %. Body mass index is 32.24 kg/m. General: Well developed, well nourished, in no acute distress. Head eyes ears nose throat: Normocephalic, atraumatic, sclera non-icteric, no xanthomas, nares are without discharge.  No apparent thyromegaly and/or mass  Lungs: Normal respiratory effort.  no wheezes, no rales, no rhonchi.  Heart: RRR with normal S1 SOFT S2.  4+ murmur gallop, no rub, PMI is normal size and placement, carotid upstroke normal with bruit, jugular venous pressure is normal Abdomen: Soft, non-tender, non-distended with normoactive bowel sounds. No hepatomegaly. No rebound/guarding. No obvious abdominal masses. Abdominal aorta is normal size without bruit Extremities: No edema. no cyanosis, no clubbing, no ulcers  Peripheral : 2+ bilateral upper extremity pulses, 2+ bilateral femoral pulses, 2+ bilateral dorsal pedal pulse Neuro: Alert and oriented. No facial asymmetry. No focal deficit. Moves all extremities spontaneously. Musculoskeletal: Normal muscle tone without kyphosis Psych:  Responds to questions appropriately with a normal affect.    Assessment: 61 year old female with left ventricular hypertrophy hypertension hyperlipidemia severe aortic stenosis with acute diastolic dysfunction congestive heart failure without evidence of myocardial infarction  Plan: 1.  Continuation of medication management for acute diastolic dysfunction congestive heart failure including furosemide for pulmonary edema 2.  Continue hypertension control with current medical regimen 3.  Further diagnostic testing including further evaluation of significance of aortic valve stenosis by cardiac catheterization and possible aortic valve replacement.  Patient understands risk and benefits of cardiac catheterization.  This includes the possibility of death stroke heart attack infection bleeding or blood clot.  She is at low risk for conscious sedation.   Signed, 66  M.DRuthann Cancer [facility] 01/03/2022, 4:24 PM

## 2022-01-03 NOTE — Assessment & Plan Note (Addendum)
-   The patient will be placed on supplemental coverage with NovoLog and will continue her basal coverage. - We will continue her Jardiance and Onglyza. - We will continue her Neurontin.

## 2022-01-03 NOTE — Hospital Course (Addendum)
Nancy [name] is a 61 y.o. female with medical history significant for anxiety, depression, type 2 diabetes mellitus, hypertension,, presented to the emergency room with acute onset of worsening dyspnea with associated dry cough and wheezing.  She admitted to dyspnea on exertion.  She has lower extremity edema without significant worsening.  She was having left sided chest pain felt as pressure without nausea or vomiting or diaphoresis 09/08 - early admission 09/09: Hypertensive, tachycardic, tachypneic, hypoxic 83% on room air to 93% on 4 L O2 [place].  Troponin 238 trended down to 197.  Lactic acid 2 trended down to 1.5.  CXR showed pulmonary edema.  Given Lasix IV 40 mg, started on IV heparin with bolus.  BNP 757.  Admitted for acute CHF, elevated troponin concern for NSTEMI versus demand ischemia from acute CHF.  Serial troponins ordered, echocardiogram ordered, cardiology consulted, Dr. Gwen Pounds notified by admitting physician. 09/09: Oxygenating at 95% on room air, still tachycardic and mild tachypnea at 21 RR.  Slight elevation WBC to 11.5. Net IO Since Admission as documented: -594.06 mL [01/03/22 0732].  Cardiology planning for cardiac cath 09/11 (Monday, today is Saturday). No UOP documented today.  Echocardiogram: EF 55 to 60%, normal LV FXN, no RWMA, moderate LVH, grade 2 diastolic dysfunction 09/10: Remains on O2, no UOP documented yesterday, will trial weaning down on oxygen today.  Awaiting cardiac catheterization tomorrow.   Consultants:  Cardiology  Procedures: none      ASSESSMENT & PLAN:   Principal Problem:   (HFpEF) heart failure with preserved ejection fraction (HCC) Active Problems:   Acute CHF (congestive heart failure) (HCC)   Elevated troponin I level   Type 2 diabetes mellitus with peripheral neuropathy (HCC)   Essential hypertension   Acute respiratory failure with hypoxia (HCC)   Acute CHF (congestive heart failure) (HCC) - HFpEF w/ Grade II DD Acute hypoxic  respiratory failure due to CHF pulmonary edema Essential hypertension Elevated troponin I level d/t non-STEMI, vs demand ischemia from acute CHF given improvement of levels. Meeting SIRS criteria without source of infection, sepsis ruled out IV Lasix. serial troponins. 2D echo: EF 55 to 60%, normal LV FXN, no RWMA, moderate LVH, grade 2 diastolic dysfunction cardiology consult, planning for cardiac cath 09/11 (Monday, today is Sunday) Supplemental O2 to wean as tolerated IV heparin pending cardiology recs - troponin is trending down, if continuing to trend down should be okay to DC heparin --> trended back up slightly, will leave patient on heparin until cardiology discontinues this. Nitro and morphine PRN continuing Toprol-XL high-dose statin therapy Aspirin Home antihypertensives also include amlodipine, losartan She is on Jardiance will continue Strict I&O, daily weights  Type 2 diabetes mellitus with peripheral neuropathy (HCC) supplemental coverage with NovoLog and will continue her basal coverage. Jardiance and Onglyza. Neurontin.       DVT prophylaxis: IV heparin may be able to discontinue and just have on regular DVT prophylaxis, if troponins continue to trend down/cardiology clearance Pertinent IV fluids/nutrition: No IV fluids, cardiac/carb diet Central lines / invasive devices: None  Code Status: DNR was confirmed by admitting hospitalist Family Communication: husband at bedside on rounds.   Disposition: Inpatient for now [facility] needs: None at this time, pending clinical course, anticipate discharge back to previous home environment Barriers to discharge / significant pending items: Cardiology planning for cardiac cath 09/11 (Monday/tomorrow)

## 2022-01-03 NOTE — Consult Note (Signed)
ANTICOAGULATION CONSULT NOTE  Pharmacy Consult for Heparin Indication: chest pain/ACS  Allergies  Allergen Reactions   Ibuprofen Other (See Comments)    Bleeding stomach   Levemir [Insulin Detemir] Other (See Comments)    Injection site reaction, bruise, knot up   Victoza [Liraglutide] Other (See Comments)    Almost went blind    Patient Measurements: Height: 5\' 3"  (160 cm) Weight: 82.6 kg (182 lb) IBW/kg (Calculated) : 52.4 Heparin Dosing Weight: 70.6 kg  Vital Signs: Temp: 98.6 F (37 C) (09/09 1000) Temp Source: Oral (09/09 1000) BP: 114/88 (09/09 1350) Pulse Rate: 111 (09/09 1350)  Labs: Recent Labs    01/02/22 2016 01/02/22 2017 01/02/22 2155 01/03/22 0456 01/03/22 1321  HGB 15.2*  --   --  14.7  --   HCT 45.4  --   --  43.9  --   PLT 162  --   --  180  --   APTT  --  28  --   --   --   LABPROT  --  14.1  --   --   --   INR  --  1.1  --   --   --   HEPARINUNFRC  --   --   --  0.17* 0.14*  CREATININE 0.67  --   --  0.69  --   TROPONINIHS 238*  --  197*  --   --      Estimated Creatinine Clearance: 74.2 mL/min (by C-G formula based on SCr of 0.69 mg/dL).   Medical History: Past Medical History:  Diagnosis Date   Anxiety    Arthritis    Asthma    Depression    Diabetes mellitus without complication (HCC)    GERD (gastroesophageal reflux disease)    no meds   Heart murmur    History of kidney stones    h/o   Hypertension    Myocardial infarction [facility]) 2015   Pneumonia 2014   Seizures (HCC)    as a child-no since age 1   Severe aortic stenosis     Medications:  No history of chronic AC use PTA  Assessment: Pharmacy has been consulted for heparin in 61yo female presenting to ED with SOB and dry cough. Abnormal EKG noted with critical troponin level of 238 recorded. Baseline labs have been ordered and are pending.   Goal of Therapy:  Heparin level 0.3-0.7 units/ml Monitor platelets by anticoagulation protocol: Yes   9/09 0456 HL  0.17,  subtherapeutic 9/09 1321 HL  0.14, subtherapeutic  Plan:  Give 2100 units bolus x 1 Increase heparin infusion at 1300 units/hr Recheck HL in 6 hr after rate change Continue to monitor H&H and platelets  11/09, PharmD Clinical Pharmacist 01/03/2022 1:52 PM

## 2022-01-03 NOTE — Assessment & Plan Note (Signed)
-   We will continue her antihypertensives. 

## 2022-01-03 NOTE — Consult Note (Signed)
ANTICOAGULATION CONSULT NOTE  Pharmacy Consult for Heparin Indication: chest pain/ACS  Allergies  Allergen Reactions   Ibuprofen Other (See Comments)    Bleeding stomach   Levemir [Insulin Detemir] Other (See Comments)    Injection site reaction, bruise, knot up   Victoza [Liraglutide] Other (See Comments)    Almost went blind    Patient Measurements: Height: 5\' 3"  (160 cm) Weight: 82.6 kg (182 lb) IBW/kg (Calculated) : 52.4 Heparin Dosing Weight: 70.6 kg  Vital Signs: Temp: 98.6 F (37 C) (09/09 0525) Temp Source: Oral (09/09 0525) BP: 119/89 (09/09 0525) Pulse Rate: 112 (09/09 0525)  Labs: Recent Labs    01/02/22 2016 01/02/22 2017 01/02/22 2155 01/03/22 0456  HGB 15.2*  --   --  14.7  HCT 45.4  --   --  43.9  PLT 162  --   --  180  APTT  --  28  --   --   LABPROT  --  14.1  --   --   INR  --  1.1  --   --   HEPARINUNFRC  --   --   --  0.17*  CREATININE 0.67  --   --  0.69  TROPONINIHS 238*  --  197*  --      Estimated Creatinine Clearance: 74.2 mL/min (by C-G formula based on SCr of 0.69 mg/dL).   Medical History: Past Medical History:  Diagnosis Date   Anxiety    Arthritis    Asthma    Depression    Diabetes mellitus without complication (HCC)    GERD (gastroesophageal reflux disease)    no meds   Heart murmur    History of kidney stones    h/o   Hypertension    Myocardial infarction [facility]) 2015   Pneumonia 2014   Seizures (HCC)    as a child-no since age 57   Severe aortic stenosis     Medications:  No history of chronic AC use PTA  Assessment: Pharmacy has been consulted for heparin in 61yo female presenting to ED with SOB and dry cough. Abnormal EKG noted with critical troponin level of 238 recorded. Baseline labs have been ordered and are pending.   Goal of Therapy:  Heparin level 0.3-0.7 units/ml Monitor platelets by anticoagulation protocol: Yes   9/09 0456 HL  0.17, subtherapeutic  Plan:  Give 2100 units bolus x  1 Increase heparin infusion at 1050 units/hr Recheck HL in 6 hr after rate change Continue to monitor H&H and platelets  11/09, PharmD, [facility] 01/03/2022 6:10 AM

## 2022-01-04 DIAGNOSIS — I5030 Unspecified diastolic (congestive) heart failure: Secondary | ICD-10-CM

## 2022-01-04 DIAGNOSIS — I509 Heart failure, unspecified: Secondary | ICD-10-CM | POA: Diagnosis not present

## 2022-01-04 DIAGNOSIS — I5031 Acute diastolic (congestive) heart failure: Secondary | ICD-10-CM

## 2022-01-04 DIAGNOSIS — R778 Other specified abnormalities of plasma proteins: Secondary | ICD-10-CM | POA: Diagnosis not present

## 2022-01-04 DIAGNOSIS — J9601 Acute respiratory failure with hypoxia: Secondary | ICD-10-CM | POA: Diagnosis not present

## 2022-01-04 LAB — CBC
HCT: 39.1 % (ref 36.0–46.0)
Hemoglobin: 13.1 g/dL (ref 12.0–15.0)
MCH: 29.4 pg (ref 26.0–34.0)
MCHC: 33.5 g/dL (ref 30.0–36.0)
MCV: 87.9 fL (ref 80.0–100.0)
Platelets: 152 10*3/uL (ref 150–400)
RBC: 4.45 MIL/uL (ref 3.87–5.11)
RDW: 12.6 % (ref 11.5–15.5)
WBC: 8.2 10*3/uL (ref 4.0–10.5)
nRBC: 0 % (ref 0.0–0.2)

## 2022-01-04 LAB — HIV ANTIBODY (ROUTINE TESTING W REFLEX): HIV Screen 4th Generation wRfx: NONREACTIVE

## 2022-01-04 LAB — HEPARIN LEVEL (UNFRACTIONATED)
Heparin Unfractionated: 0.27 IU/mL — ABNORMAL LOW (ref 0.30–0.70)
Heparin Unfractionated: 0.3 IU/mL (ref 0.30–0.70)
Heparin Unfractionated: 0.32 IU/mL (ref 0.30–0.70)

## 2022-01-04 LAB — GLUCOSE, CAPILLARY
Glucose-Capillary: 139 mg/dL — ABNORMAL HIGH (ref 70–99)
Glucose-Capillary: 198 mg/dL — ABNORMAL HIGH (ref 70–99)
Glucose-Capillary: 219 mg/dL — ABNORMAL HIGH (ref 70–99)
Glucose-Capillary: 179 mg/dL — ABNORMAL HIGH (ref 70–99)

## 2022-01-04 MED ORDER — ACETAMINOPHEN-CODEINE #3 300-30 MG PO TABS
1.0000 | ORAL_TABLET | Freq: Two times a day (BID) | ORAL | Status: DC | PRN
  Administered 2022-01-04 – 2022-01-05 (×2): 2 via ORAL
  Filled 2022-01-04 (×3): qty 2

## 2022-01-04 MED ORDER — INSULIN GLARGINE-YFGN 100 UNIT/ML ~~LOC~~ SOLN
34.0000 [IU] | Freq: Every day | SUBCUTANEOUS | Status: DC
  Administered 2022-01-04: 34 [IU] via SUBCUTANEOUS
  Filled 2022-01-04 (×2): qty 0.34

## 2022-01-04 MED ORDER — HEPARIN BOLUS VIA INFUSION
1300.0000 [IU] | Freq: Once | INTRAVENOUS | Status: AC
Start: 2022-01-04 — End: 2022-01-04
  Administered 2022-01-04: 1300 [IU] via INTRAVENOUS
  Filled 2022-01-04: qty 1300

## 2022-01-04 NOTE — Progress Notes (Signed)
PROGRESS NOTE    Nancy [name]   UTM:[id number] DOB: 28-Oct-1960  DOA: 01/02/2022 Date of Service: 01/04/22 PCP: Hillery Aldo, MD     Brief Narrative / Hospital Course:  Nancy [name] is a 61 y.o. female with medical history significant for anxiety, depression, type 2 diabetes mellitus, hypertension,, presented to the emergency room with acute onset of worsening dyspnea with associated dry cough and wheezing.  She admitted to dyspnea on exertion.  She has lower extremity edema without significant worsening.  She was having left sided chest pain felt as pressure without nausea or vomiting or diaphoresis 09/08 - early admission 09/09: Hypertensive, tachycardic, tachypneic, hypoxic 83% on room air to 93% on 4 L O2 [place].  Troponin 238 trended down to 197.  Lactic acid 2 trended down to 1.5.  CXR showed pulmonary edema.  Given Lasix IV 40 mg, started on IV heparin with bolus.  BNP 757.  Admitted for acute CHF, elevated troponin concern for NSTEMI versus demand ischemia from acute CHF.  Serial troponins ordered, echocardiogram ordered, cardiology consulted, Dr. Gwen Pounds notified by admitting physician. 09/09: Oxygenating at 95% on room air, still tachycardic and mild tachypnea at 21 RR.  Slight elevation WBC to 11.5. Net IO Since Admission as documented: -594.06 mL [01/03/22 0732].  Cardiology planning for cardiac cath 09/11 (Monday, today is Saturday). No UOP documented today.  Echocardiogram: EF 55 to 60%, normal LV FXN, no RWMA, moderate LVH, grade 2 diastolic dysfunction 09/10: Remains on O2, no UOP documented yesterday, will trial weaning down on oxygen today.  Awaiting cardiac catheterization tomorrow.   Consultants:  Cardiology  Procedures: none      ASSESSMENT & PLAN:   Principal Problem:   (HFpEF) heart failure with preserved ejection fraction (HCC) Active Problems:   Acute CHF (congestive heart failure) (HCC)   Elevated troponin I level   Type 2 diabetes mellitus with  peripheral neuropathy (HCC)   Essential hypertension   Acute respiratory failure with hypoxia (HCC)   Acute CHF (congestive heart failure) (HCC) - HFpEF w/ Grade II DD Acute hypoxic respiratory failure due to CHF pulmonary edema Essential hypertension Elevated troponin I level d/t non-STEMI, vs demand ischemia from acute CHF given improvement of levels. Meeting SIRS criteria without source of infection, sepsis ruled out IV Lasix. serial troponins. 2D echo: EF 55 to 60%, normal LV FXN, no RWMA, moderate LVH, grade 2 diastolic dysfunction cardiology consult, planning for cardiac cath 09/11 (Monday, today is Sunday) Supplemental O2 to wean as tolerated IV heparin pending cardiology recs - troponin is trending down, if continuing to trend down should be okay to DC heparin --> trended back up slightly, will leave patient on heparin until cardiology discontinues this. Nitro and morphine PRN continuing Toprol-XL high-dose statin therapy Aspirin Home antihypertensives also include amlodipine, losartan She is on Jardiance will continue Strict I&O, daily weights  Type 2 diabetes mellitus with peripheral neuropathy (HCC) supplemental coverage with NovoLog and will continue her basal coverage. Jardiance and Onglyza. Neurontin.       DVT prophylaxis: IV heparin may be able to discontinue and just have on regular DVT prophylaxis, if troponins continue to trend down/cardiology clearance Pertinent IV fluids/nutrition: No IV fluids, cardiac/carb diet Central lines / invasive devices: None  Code Status: DNR was confirmed by admitting hospitalist Family Communication: husband at bedside on rounds.   Disposition: Inpatient for now [facility] needs: None at this time, pending clinical course, anticipate discharge back to previous home environment Barriers to discharge / significant  pending items: Cardiology planning for cardiac cath 09/11 (Monday/tomorrow)             Subjective:   Patient reports doing well today, she is standing up at the sink in the room when I am on rounds.  She states she would like to get a shower today if possible.  Reports breathing is a lot better.  Anticipating cardiac catheterization tomorrow.       Objective:  Vitals:   01/04/22 0433 01/04/22 0741 01/04/22 1003 01/04/22 1151  BP:  (!) 84/59 101/75 91/64  Pulse:  89 92 88  Resp:  18  18  Temp:  98.7 F (37.1 C)  97.8 F (36.6 C)  TempSrc:      SpO2:  97% 100% 97%  Weight: 81 kg     Height:        Intake/Output Summary (Last 24 hours) at 01/04/2022 1225 Last data filed at 01/04/2022 1100 Gross per 24 hour  Intake 503.76 ml  Output 500 ml  Net 3.76 ml   Filed Weights   01/02/22 2000 01/04/22 0433  Weight: 82.6 kg 81 kg    Examination:  Constitutional:  VS as above General Appearance: alert, well-developed, well-nourished, NAD Eyes: Normal lids and conjunctive, non-icteric sclera Ears, Nose, Mouth, Throat: Normal external appearance MMM Neck: No masses, trachea midline Respiratory: Normal respiratory effort No wheeze No rhonchi Slight basal rales bilaterally Cardiovascular: S1/S2 normal No murmur No rub/gallop auscultated No JVD No lower extremity edema Musculoskeletal:  No clubbing/cyanosis of digits Gait normal Symmetrical movement in all extremities Neurological: No cranial nerve deficit on limited exam Alert Psychiatric: Normal judgment/insight Normal mood and affect       Scheduled Medications:   acidophilus   Oral Daily   amLODipine  10 mg Oral Q breakfast   aspirin EC  81 mg Oral Daily   empagliflozin  10 mg Oral Daily   furosemide  40 mg Intravenous Q12H   gabapentin  300 mg Oral QHS   insulin glargine-yfgn  34 Units Subcutaneous QHS   losartan  100 mg Oral Q breakfast   metoprolol succinate  50 mg Oral Q breakfast   sodium chloride flush  3 mL Intravenous Q12H    Continuous Infusions:  heparin 1,600 Units/hr (01/04/22 0834)     PRN Medications:  acetaminophen **OR** acetaminophen, acetaminophen-codeine, albuterol, magnesium hydroxide, methocarbamol, ondansetron **OR** ondansetron (ZOFRAN) IV, traZODone  Antimicrobials:  Anti-infectives (From admission, onward)    None       Data Reviewed: I have personally reviewed following labs and imaging studies  CBC: Recent Labs  Lab 01/02/22 2016 01/03/22 0456 01/04/22 0618  WBC 9.7 11.5* 8.2  NEUTROABS 7.1  --   --   HGB 15.2* 14.7 13.1  HCT 45.4 43.9 39.1  MCV 88.7 88.3 87.9  PLT 162 180 152   Basic Metabolic Panel: Recent Labs  Lab 01/02/22 2016 01/03/22 0456  NA 137 138  K 4.1 4.0  CL 103 102  CO2 24 25  GLUCOSE 218* 187*  BUN 12 11  CREATININE 0.67 0.69  CALCIUM 8.6* 8.7*   GFR: Estimated Creatinine Clearance: 73.4 mL/min (by C-G formula based on SCr of 0.69 mg/dL). Liver Function Tests: Recent Labs  Lab 01/02/22 2016  AST 21  ALT 19  ALKPHOS 64  BILITOT 0.7  PROT 7.2  ALBUMIN 3.9   No results for input(s): "LIPASE", "AMYLASE" in the last 168 hours. No results for input(s): "AMMONIA" in the last 168 hours. Coagulation Profile: Recent  Labs  Lab 01/02/22 2017  INR 1.1   Cardiac Enzymes: No results for input(s): "CKTOTAL", "CKMB", "CKMBINDEX", "TROPONINI" in the last 168 hours. BNP (last 3 results) No results for input(s): "PROBNP" in the last 8760 hours. HbA1C: Recent Labs    01/03/22 0456  HGBA1C 8.4*   CBG: Recent Labs  Lab 01/03/22 1145 01/03/22 1755 01/03/22 2129 01/04/22 0742 01/04/22 1153  GLUCAP 165* 129* 179* 139* 198*   Lipid Profile: No results for input(s): "CHOL", "HDL", "LDLCALC", "TRIG", "CHOLHDL", "LDLDIRECT" in the last 72 hours. Thyroid Function Tests: No results for input(s): "TSH", "T4TOTAL", "FREET4", "T3FREE", "THYROIDAB" in the last 72 hours. Anemia Panel: No results for input(s): "VITAMINB12", "FOLATE", "FERRITIN", "TIBC", "IRON", "RETICCTPCT" in the last 72 hours. Urine analysis:     Component Value Date/Time   COLORURINE STRAW (A) 01/02/2022 2023   APPEARANCEUR CLEAR (A) 01/02/2022 2023   LABSPEC 1.031 (H) 01/02/2022 2023   PHURINE 7.0 01/02/2022 2023   GLUCOSEU >=500 (A) 01/02/2022 2023   HGBUR NEGATIVE 01/02/2022 2023   BILIRUBINUR NEGATIVE 01/02/2022 2023   KETONESUR NEGATIVE 01/02/2022 2023   PROTEINUR NEGATIVE 01/02/2022 2023   NITRITE NEGATIVE 01/02/2022 2023   LEUKOCYTESUR NEGATIVE 01/02/2022 2023   Sepsis Labs: @LABRCNTIP (procalcitonin:4,lacticidven:4)  Recent Results (from the past 240 hour(s))  Resp Panel by RT-PCR (Flu A&B, Covid) Anterior Nasal Swab     Status: None   Collection Time: 01/02/22  8:17 PM   Specimen: Anterior Nasal Swab  Result Value Ref Range Status   SARS Coronavirus 2 by RT PCR NEGATIVE NEGATIVE Final    Comment: (NOTE) SARS-CoV-2 target nucleic acids are NOT DETECTED.  The SARS-CoV-2 RNA is generally detectable in upper respiratory specimens during the acute phase of infection. The lowest concentration of SARS-CoV-2 viral copies this assay can detect is 138 copies/mL. A negative result does not preclude SARS-Cov-2 infection and should not be used as the sole basis for treatment or other patient management decisions. A negative result may occur with  improper specimen collection/handling, submission of specimen other than nasopharyngeal swab, presence of viral mutation(s) within the areas targeted by this assay, and inadequate number of viral copies(<138 copies/mL). A negative result must be combined with clinical observations, patient history, and epidemiological information. The expected result is Negative.  Fact Sheet for Patients:  03/04/22  Fact Sheet for Healthcare Providers:  BloggerCourse.com  This test is no t yet approved or cleared by the [employer]ious[employer].[place] FDA and  has been authorized for detection and/or diagnosis of SARS-CoV-2 by FDA under an Emergency  Use Authorization (EUA). This EUA will remain  in effect (meaning this test can be used) for the duration of the COVID-19 declaration under Section 564(b)(1) of the Act, 21 U.S.C.section 360bbb-3(b)(1), unless the authorization is terminated  or revoked sooner.       Influenza A by PCR NEGATIVE NEGATIVE Final   Influenza B by PCR NEGATIVE NEGATIVE Final    Comment: (NOTE) The Xpert Xpress SARS-CoV-2/FLU/RSV plus assay is intended as an aid in the diagnosis of influenza from Nasopharyngeal swab specimens and should not be used as a sole basis for treatment. Nasal washings and aspirates are unacceptable for Xpert Xpress SARS-CoV-2/FLU/RSV testing.  Fact Sheet for Patients: [place]  Fact Sheet for Healthcare Providers: BloggerCourse.com  This test is not yet approved or cleared by the [employer]ious[employer]ker.[employer] FDA and has been authorized for detection and/or diagnosis of SARS-CoV-2 by FDA under an Emergency Use Authorization (EUA). This EUA will remain in effect (meaning this test can  be used) for the duration of the COVID-19 declaration under Section 564(b)(1) of the Act, 21 U.S.C. section 360bbb-3(b)(1), unless the authorization is terminated or revoked.  Performed at [facility], [address] [place], [place] [address]          Radiology Studies: ECHOCARDIOGRAM COMPLETE  Result Date: 01/03/2022    ECHOCARDIOGRAM REPORT   Patient Name:   Nancy [name] Date of Exam: 01/03/2022 Medical Rec #:  [id number]       Height:       63.0 in Accession #:    [id number]      Weight:       182.0 lb Date of Birth:  [birth year]      BSA:          1.858 m Patient Age:    60 years        BP:           120/90 mmHg Patient Gender: F               HR:           105 bpm. Exam Location:  [facility] Procedure: 2D Echo Indications:     CHF Acute Diastolic I50.31  History:         Patient has no prior history of Echocardiogram examinations.   Sonographer:     Overton Mam RDCS Referring Phys:  [id number] Vanessa Kick A MANSY Diagnosing Phys: Arnoldo Hooker MD IMPRESSIONS  1. Left ventricular ejection fraction, by estimation, is 55 to 60%. The left ventricle has normal function. The left ventricle has no regional wall motion abnormalities. There is moderate left ventricular hypertrophy. Left ventricular diastolic parameters are consistent with Grade II diastolic dysfunction (pseudonormalization).  2. Right ventricular systolic function is normal. The right ventricular size is normal.  3. Left atrial size was mildly dilated.  4. The mitral valve is normal in structure. Moderate mitral valve regurgitation.  5. The aortic valve is calcified. Aortic valve regurgitation is trivial. Severe aortic valve stenosis.  6. Aortic dilatation noted. FINDINGS  Left Ventricle: Left ventricular ejection fraction, by estimation, is 55 to 60%. The left ventricle has normal function. The left ventricle has no regional wall motion abnormalities. The left ventricular internal cavity size was normal in size. There is  moderate left ventricular hypertrophy. Left ventricular diastolic parameters are consistent with Grade II diastolic dysfunction (pseudonormalization). Right Ventricle: The right ventricular size is normal. No increase in right ventricular wall thickness. Right ventricular systolic function is normal. Left Atrium: Left atrial size was mildly dilated. Right Atrium: Right atrial size was normal in size. Pericardium: There is no evidence of pericardial effusion. Mitral Valve: The mitral valve is normal in structure. Moderate mitral valve regurgitation. MV peak gradient, 14.4 mmHg. The mean mitral valve gradient is 4.0 mmHg. Tricuspid Valve: The tricuspid valve is normal in structure. Tricuspid valve regurgitation is mild. Aortic Valve: The aortic valve is calcified. Aortic valve regurgitation is trivial. Aortic regurgitation PHT measures 239 msec. Severe aortic stenosis is  present. Aortic valve mean gradient measures 67.2 mmHg. Aortic valve peak gradient measures 106.5 mmHg. Aortic valve area, by VTI measures 0.27 cm. Pulmonic Valve: The pulmonic valve was normal in structure. Pulmonic valve regurgitation is trivial. Aorta: Aortic dilatation noted. IAS/Shunts: No atrial level shunt detected by color flow Doppler.  LEFT VENTRICLE PLAX 2D LVIDd:         4.71 cm     Diastology LVIDs:         3.31  cm     LV e' medial:    10.00 cm/s LV PW:         2.03 cm     LV E/e' medial:  14.8 LV IVS:        1.60 cm     LV e' lateral:   7.07 cm/s LVOT diam:     1.80 cm     LV E/e' lateral: 20.9 LV SV:         30 LV SV Index:   16 LVOT Area:     2.54 cm  LV Volumes (MOD) LV vol d, MOD A2C: 56.5 ml LV vol d, MOD A4C: 93.4 ml LV vol s, MOD A2C: 24.8 ml LV vol s, MOD A4C: 36.6 ml LV SV MOD A2C:     31.7 ml LV SV MOD A4C:     93.4 ml LV SV MOD BP:      43.3 ml RIGHT VENTRICLE RV Basal diam:  2.72 cm RV S prime:     13.70 cm/s TAPSE (M-mode): 1.7 cm LEFT ATRIUM             Index        RIGHT ATRIUM          Index LA diam:        5.10 cm 2.75 cm/m   RA Area:     5.76 cm LA Vol (A2C):   31.0 ml 16.69 ml/m  RA Volume:   9.02 ml  4.86 ml/m LA Vol (A4C):   50.0 ml 26.92 ml/m LA Biplane Vol: 41.5 ml 22.34 ml/m  AORTIC VALVE                     PULMONIC VALVE AV Area (Vmax):    0.34 cm      PV Vmax:       0.98 m/s AV Area (Vmean):   0.29 cm      PV Peak grad:  3.8 mmHg AV Area (VTI):     0.27 cm AV Vmax:           516.00 cm/s AV Vmean:          386.000 cm/s AV VTI:            1.102 m AV Peak Grad:      106.5 mmHg AV Mean Grad:      67.2 mmHg LVOT Vmax:         69.00 cm/s LVOT Vmean:        44.000 cm/s LVOT VTI:          0.117 m LVOT/AV VTI ratio: 0.11 AI PHT:            239 msec  AORTA Ao Root diam: 2.40 cm Ao Asc diam:  2.90 cm MITRAL VALVE MV Area (PHT): 4.77 cm     SHUNTS MV Area VTI:   0.89 cm     Systemic VTI:  0.12 m MV Peak grad:  14.4 mmHg    Systemic Diam: 1.80 cm MV Mean grad:  4.0 mmHg MV  Vmax:       1.90 m/s MV Vmean:      79.8 cm/s MV Decel Time: 159 msec MR Peak grad: 154.3 mmHg MR Mean grad: 91.0 mmHg MR Vmax:      621.00 cm/s MR Vmean:     438.0 cm/s MV E velocity: 148.00 cm/s Arnoldo Hooker MD Electronically signed by Arnoldo Hooker MD Signature Date/Time: 01/03/2022/4:07:01 PM    Final  LOS: 2 days       Sunnie Nielsen, DO [facility] 01/04/2022, 12:25 PM   Staff may message me via secure chat in Epic  but this may not receive immediate response,  please page for urgent matters!  If 7PM-7AM, please contact night-coverage [email or web address]  Dictation software was used to generate the above note. Typos may occur and escape review, as with typed/written notes. Please contact Dr Lyn Hollingshead directly for clarity if needed.

## 2022-01-04 NOTE — Consult Note (Signed)
ANTICOAGULATION CONSULT NOTE  Pharmacy Consult for Heparin Indication: chest pain/ACS  Allergies  Allergen Reactions   Ibuprofen Other (See Comments)    Bleeding stomach   Levemir [Insulin Detemir] Other (See Comments)    Injection site reaction, bruise, knot up   Victoza [Liraglutide] Other (See Comments)    Almost went blind    Patient Measurements: Height: 5\' 3"  (160 cm) Weight: 81 kg (178 lb 9.6 oz) IBW/kg (Calculated) : 52.4 Heparin Dosing Weight: 70.6 kg  Vital Signs: Temp: 98.5 F (36.9 C) (09/10 0429) BP: 110/87 (09/10 0429) Pulse Rate: 94 (09/10 0429)  Labs: Recent Labs    01/02/22 2016 01/02/22 2016 01/02/22 2017 01/02/22 2155 01/03/22 0456 01/03/22 1321 01/03/22 2001 01/04/22 0618  HGB 15.2*  --   --   --  14.7  --   --  13.1  HCT 45.4  --   --   --  43.9  --   --  39.1  PLT 162  --   --   --  180  --   --  152  APTT  --   --  28  --   --   --   --   --   LABPROT  --   --  14.1  --   --   --   --   --   INR  --   --  1.1  --   --   --   --   --   HEPARINUNFRC  --    < >  --   --  0.17* 0.14* 0.26* 0.27*  CREATININE 0.67  --   --   --  0.69  --   --   --   TROPONINIHS 238*  --   --  197*  --  215*  --   --    < > = values in this interval not displayed.     Estimated Creatinine Clearance: 73.4 mL/min (by C-G formula based on SCr of 0.69 mg/dL).   Medical History: Past Medical History:  Diagnosis Date   Anxiety    Arthritis    Asthma    Depression    Diabetes mellitus without complication (HCC)    GERD (gastroesophageal reflux disease)    no meds   Heart murmur    History of kidney stones    h/o   Hypertension    Myocardial infarction [facility]) 2015   Pneumonia 2014   Seizures (HCC)    as a child-no since age 41   Severe aortic stenosis     Medications:  No history of chronic AC use PTA  Assessment: Pharmacy has been consulted for heparin in 61yo female presenting to ED with SOB and dry cough. Abnormal EKG noted with critical  troponin level of 238 recorded. Baseline labs have been ordered and are pending.   Goal of Therapy:  Heparin level 0.3-0.7 units/ml Monitor platelets by anticoagulation protocol: Yes   9/09 0456 HL  0.17, subtherapeutic 9/09 1321 HL  0.14, subtherapeutic 9/09 2001 HL 0.26, subtherapeutic 9/10 0618 HL 0.27, subtherapeutic   Plan:  Give 1300 units bolus x 1 Increase heparin infusion at 1600 units/hr Recheck HL 6 hours following rate change Continue to monitor H&H and platelets  11/09, PharmD, BCPS Clinical Pharmacist   01/04/2022 7:17 AM

## 2022-01-04 NOTE — Consult Note (Signed)
ANTICOAGULATION CONSULT NOTE  Pharmacy Consult for Heparin Indication: chest pain/ACS  Allergies  Allergen Reactions   Ibuprofen Other (See Comments)    Bleeding stomach   Levemir [Insulin Detemir] Other (See Comments)    Injection site reaction, bruise, knot up   Victoza [Liraglutide] Other (See Comments)    Almost went blind    Patient Measurements: Height: 5\' 3"  (160 cm) Weight: 81 kg (178 lb 9.6 oz) IBW/kg (Calculated) : 52.4 Heparin Dosing Weight: 70.6 kg  Vital Signs: Temp: 98.1 F (36.7 C) (09/10 2131) BP: 107/72 (09/10 2131) Pulse Rate: 84 (09/10 2131)  Labs: Recent Labs    01/02/22 2016 01/02/22 2016 01/02/22 2017 01/02/22 2155 01/03/22 0456 01/03/22 1321 01/03/22 2001 01/04/22 0618 01/04/22 1344 01/04/22 2223  HGB 15.2*  --   --   --  14.7  --   --  13.1  --   --   HCT 45.4  --   --   --  43.9  --   --  39.1  --   --   PLT 162  --   --   --  180  --   --  152  --   --   APTT  --   --  28  --   --   --   --   --   --   --   LABPROT  --   --  14.1  --   --   --   --   --   --   --   INR  --   --  1.1  --   --   --   --   --   --   --   HEPARINUNFRC  --    < >  --   --  0.17* 0.14*   < > 0.27* 0.30 0.32  CREATININE 0.67  --   --   --  0.69  --   --   --   --   --   TROPONINIHS 238*  --   --  197*  --  215*  --   --   --   --    < > = values in this interval not displayed.     Estimated Creatinine Clearance: 73.4 mL/min (by C-G formula based on SCr of 0.69 mg/dL).   Medical History: Past Medical History:  Diagnosis Date   Anxiety    Arthritis    Asthma    Depression    Diabetes mellitus without complication (HCC)    GERD (gastroesophageal reflux disease)    no meds   Heart murmur    History of kidney stones    h/o   Hypertension    Myocardial infarction [facility]) 2015   Pneumonia 2014   Seizures (HCC)    as a child-no since age 35   Severe aortic stenosis     Medications:  No history of chronic AC use PTA  Assessment: Pharmacy has  been consulted for heparin in 61yo female presenting to ED with SOB and dry cough. Abnormal EKG noted with critical troponin level of 238 recorded. Baseline labs have been ordered and are pending.   Goal of Therapy:  Heparin level 0.3-0.7 units/ml Monitor platelets by anticoagulation protocol: Yes   9/09 0456 HL  0.17, subtherapeutic 9/09 1321 HL  0.14, subtherapeutic 9/09 2001 HL 0.26, subtherapeutic 9/10 0618 HL 0.27, subtherapeutic  9/10 1344 HL 0.30, slightly therapeutic 9/11 2223 HL 0.32, therapeutic x  2   Plan:  Will continue heparin infusion at 1750 units/hr Recheck HL w/ AM labs to confirm Continue to monitor H&H and platelets  Otelia Sergeant, PharmD, [facility] 01/04/2022 11:41 PM

## 2022-01-04 NOTE — Consult Note (Signed)
ANTICOAGULATION CONSULT NOTE  Pharmacy Consult for Heparin Indication: chest pain/ACS  Allergies  Allergen Reactions   Ibuprofen Other (See Comments)    Bleeding stomach   Levemir [Insulin Detemir] Other (See Comments)    Injection site reaction, bruise, knot up   Victoza [Liraglutide] Other (See Comments)    Almost went blind    Patient Measurements: Height: 5\' 3"  (160 cm) Weight: 81 kg (178 lb 9.6 oz) IBW/kg (Calculated) : 52.4 Heparin Dosing Weight: 70.6 kg  Vital Signs: Temp: 97.8 F (36.6 C) (09/10 1151) BP: 91/64 (09/10 1151) Pulse Rate: 88 (09/10 1151)  Labs: Recent Labs    01/02/22 2016 01/02/22 2016 01/02/22 2017 01/02/22 2155 01/03/22 0456 01/03/22 1321 01/03/22 2001 01/04/22 0618 01/04/22 1344  HGB 15.2*  --   --   --  14.7  --   --  13.1  --   HCT 45.4  --   --   --  43.9  --   --  39.1  --   PLT 162  --   --   --  180  --   --  152  --   APTT  --   --  28  --   --   --   --   --   --   LABPROT  --   --  14.1  --   --   --   --   --   --   INR  --   --  1.1  --   --   --   --   --   --   HEPARINUNFRC  --    < >  --   --  0.17* 0.14* 0.26* 0.27* 0.30  CREATININE 0.67  --   --   --  0.69  --   --   --   --   TROPONINIHS 238*  --   --  197*  --  215*  --   --   --    < > = values in this interval not displayed.     Estimated Creatinine Clearance: 73.4 mL/min (by C-G formula based on SCr of 0.69 mg/dL).   Medical History: Past Medical History:  Diagnosis Date   Anxiety    Arthritis    Asthma    Depression    Diabetes mellitus without complication (HCC)    GERD (gastroesophageal reflux disease)    no meds   Heart murmur    History of kidney stones    h/o   Hypertension    Myocardial infarction [facility]) 2015   Pneumonia 2014   Seizures (HCC)    as a child-no since age 25   Severe aortic stenosis     Medications:  No history of chronic AC use PTA  Assessment: Pharmacy has been consulted for heparin in 61yo female presenting to ED with  SOB and dry cough. Abnormal EKG noted with critical troponin level of 238 recorded. Baseline labs have been ordered and are pending.   Goal of Therapy:  Heparin level 0.3-0.7 units/ml Monitor platelets by anticoagulation protocol: Yes   9/09 0456 HL  0.17, subtherapeutic 9/09 1321 HL  0.14, subtherapeutic 9/09 2001 HL 0.26, subtherapeutic 9/10 0618 HL 0.27, subtherapeutic  9/10 1344 HL 0.30, slightly therapeutic  Plan:  Will increase heparin infusion at 1750 units/hr Recheck HL 6 hours following rate change Continue to monitor H&H and platelets  11/09, PharmD Clinical Pharmacist 01/04/2022 2:56 PM

## 2022-01-04 NOTE — Progress Notes (Signed)
[facility] Cardiology [facility] Encounter Note  Patient: Nancy [name] / Admit Date: 01/02/2022 / Date of Encounter: 01/04/2022, 2:40 PM   Subjective: Overall the patient has made improvements in his symptoms at this time with no evidence of chest discomfort or worsening congestive heart failure.  She does still have significant cardiac murmur with an echocardiogram showing normal LV systolic function and severe aortic valve stenosis.  We have suggested further intervention  Review of Systems: Positive for: Shortness of breath Negative for: Vision change, hearing change, syncope, dizziness, nausea, vomiting,diarrhea, bloody stool, stomach pain, cough, congestion, diaphoresis, urinary frequency, urinary pain,skin lesions, skin rashes Others previously listed  Objective: Telemetry: Normal sinus rhythm Physical Exam: Blood pressure 91/64, pulse 88, temperature 97.8 F (36.6 C), resp. rate 19, height 5\' 3"  (1.6 m), weight 81 kg, last menstrual period 09/14/2007, SpO2 97 %. Body mass index is 31.64 kg/m. General: Well developed, well nourished, in no acute distress. Head: Normocephalic, atraumatic, sclera non-icteric, no xanthomas, nares are without discharge. Neck: No apparent masses Lungs: Normal respirations with no wheezes, no rhonchi, no rales , no crackles   Heart: Regular rate and rhythm, normal S1 SOFT S2, 4+ aortic murmur, no rub, no gallop, PMI is normal size and placement, carotid upstroke normal without bruit, jugular venous pressure normal Abdomen: Soft, non-tender, non-distended with normoactive bowel sounds. No hepatosplenomegaly. Abdominal aorta is normal size without bruit Extremities: No edema, no clubbing, no cyanosis, no ulcers,  Peripheral: 2+ radial, 2+ femoral, 2+ dorsal pedal pulses Neuro: Alert and oriented. Moves all extremities spontaneously. Psych:  Responds to questions appropriately with a normal affect.   Intake/Output Summary (Last 24 hours) at 01/04/2022  1440 Last data filed at 01/04/2022 1100 Gross per 24 hour  Intake 503.76 ml  Output 500 ml  Net 3.76 ml    Inpatient Medications:   acidophilus   Oral Daily   amLODipine  10 mg Oral Q breakfast   aspirin EC  81 mg Oral Daily   empagliflozin  10 mg Oral Daily   furosemide  40 mg Intravenous Q12H   gabapentin  300 mg Oral QHS   insulin glargine-yfgn  34 Units Subcutaneous QHS   losartan  100 mg Oral Q breakfast   metoprolol succinate  50 mg Oral Q breakfast   sodium chloride flush  3 mL Intravenous Q12H   Infusions:   heparin 1,600 Units/hr (01/04/22 0834)    Labs: Recent Labs    01/02/22 2016 01/03/22 0456  NA 137 138  K 4.1 4.0  CL 103 102  CO2 24 25  GLUCOSE 218* 187*  BUN 12 11  CREATININE 0.67 0.69  CALCIUM 8.6* 8.7*   Recent Labs    01/02/22 2016  AST 21  ALT 19  ALKPHOS 64  BILITOT 0.7  PROT 7.2  ALBUMIN 3.9   Recent Labs    01/02/22 2016 01/03/22 0456 01/04/22 0618  WBC 9.7 11.5* 8.2  NEUTROABS 7.1  --   --   HGB 15.2* 14.7 13.1  HCT 45.4 43.9 39.1  MCV 88.7 88.3 87.9  PLT 162 180 152   No results for input(s): "CKTOTAL", "CKMB", "TROPONINI" in the last 72 hours. Invalid input(s): "POCBNP" Recent Labs    01/03/22 0456  HGBA1C 8.4*     Weights: Filed Weights   01/02/22 2000 01/04/22 0433  Weight: 82.6 kg 81 kg     Radiology/Studies:  ECHOCARDIOGRAM COMPLETE  Result Date: 01/03/2022    ECHOCARDIOGRAM REPORT   Patient Name:  Nancy [name] Date of Exam: 01/03/2022 Medical Rec #:  [id number]       Height:       63.0 in Accession #:    [id number]      Weight:       182.0 lb Date of Birth:  Oct 21, 1960      BSA:          1.858 m Patient Age:    60 years        BP:           120/90 mmHg Patient Gender: F               HR:           105 bpm. Exam Location:  [facility] Procedure: 2D Echo Indications:     CHF Acute Diastolic I50.31  History:         Patient has no prior history of Echocardiogram examinations.  Sonographer:     Overton Mam RDCS  Referring Phys:  [id number] Vanessa Kick A MANSY Diagnosing Phys: Arnoldo Hooker MD IMPRESSIONS  1. Left ventricular ejection fraction, by estimation, is 55 to 60%. The left ventricle has normal function. The left ventricle has no regional wall motion abnormalities. There is moderate left ventricular hypertrophy. Left ventricular diastolic parameters are consistent with Grade II diastolic dysfunction (pseudonormalization).  2. Right ventricular systolic function is normal. The right ventricular size is normal.  3. Left atrial size was mildly dilated.  4. The mitral valve is normal in structure. Moderate mitral valve regurgitation.  5. The aortic valve is calcified. Aortic valve regurgitation is trivial. Severe aortic valve stenosis.  6. Aortic dilatation noted. FINDINGS  Left Ventricle: Left ventricular ejection fraction, by estimation, is 55 to 60%. The left ventricle has normal function. The left ventricle has no regional wall motion abnormalities. The left ventricular internal cavity size was normal in size. There is  moderate left ventricular hypertrophy. Left ventricular diastolic parameters are consistent with Grade II diastolic dysfunction (pseudonormalization). Right Ventricle: The right ventricular size is normal. No increase in right ventricular wall thickness. Right ventricular systolic function is normal. Left Atrium: Left atrial size was mildly dilated. Right Atrium: Right atrial size was normal in size. Pericardium: There is no evidence of pericardial effusion. Mitral Valve: The mitral valve is normal in structure. Moderate mitral valve regurgitation. MV peak gradient, 14.4 mmHg. The mean mitral valve gradient is 4.0 mmHg. Tricuspid Valve: The tricuspid valve is normal in structure. Tricuspid valve regurgitation is mild. Aortic Valve: The aortic valve is calcified. Aortic valve regurgitation is trivial. Aortic regurgitation PHT measures 239 msec. Severe aortic stenosis is present. Aortic valve mean gradient  measures 67.2 mmHg. Aortic valve peak gradient measures 106.5 mmHg. Aortic valve area, by VTI measures 0.27 cm. Pulmonic Valve: The pulmonic valve was normal in structure. Pulmonic valve regurgitation is trivial. Aorta: Aortic dilatation noted. IAS/Shunts: No atrial level shunt detected by color flow Doppler.  LEFT VENTRICLE PLAX 2D LVIDd:         4.71 cm     Diastology LVIDs:         3.31 cm     LV e' medial:    10.00 cm/s LV PW:         2.03 cm     LV E/e' medial:  14.8 LV IVS:        1.60 cm     LV e' lateral:   7.07 cm/s LVOT diam:     1.80 cm  LV E/e' lateral: 20.9 LV SV:         30 LV SV Index:   16 LVOT Area:     2.54 cm  LV Volumes (MOD) LV vol d, MOD A2C: 56.5 ml LV vol d, MOD A4C: 93.4 ml LV vol s, MOD A2C: 24.8 ml LV vol s, MOD A4C: 36.6 ml LV SV MOD A2C:     31.7 ml LV SV MOD A4C:     93.4 ml LV SV MOD BP:      43.3 ml RIGHT VENTRICLE RV Basal diam:  2.72 cm RV S prime:     13.70 cm/s TAPSE (M-mode): 1.7 cm LEFT ATRIUM             Index        RIGHT ATRIUM          Index LA diam:        5.10 cm 2.75 cm/m   RA Area:     5.76 cm LA Vol (A2C):   31.0 ml 16.69 ml/m  RA Volume:   9.02 ml  4.86 ml/m LA Vol (A4C):   50.0 ml 26.92 ml/m LA Biplane Vol: 41.5 ml 22.34 ml/m  AORTIC VALVE                     PULMONIC VALVE AV Area (Vmax):    0.34 cm      PV Vmax:       0.98 m/s AV Area (Vmean):   0.29 cm      PV Peak grad:  3.8 mmHg AV Area (VTI):     0.27 cm AV Vmax:           516.00 cm/s AV Vmean:          386.000 cm/s AV VTI:            1.102 m AV Peak Grad:      106.5 mmHg AV Mean Grad:      67.2 mmHg LVOT Vmax:         69.00 cm/s LVOT Vmean:        44.000 cm/s LVOT VTI:          0.117 m LVOT/AV VTI ratio: 0.11 AI PHT:            239 msec  AORTA Ao Root diam: 2.40 cm Ao Asc diam:  2.90 cm MITRAL VALVE MV Area (PHT): 4.77 cm     SHUNTS MV Area VTI:   0.89 cm     Systemic VTI:  0.12 m MV Peak grad:  14.4 mmHg    Systemic Diam: 1.80 cm MV Mean grad:  4.0 mmHg MV Vmax:       1.90 m/s MV Vmean:       79.8 cm/s MV Decel Time: 159 msec MR Peak grad: 154.3 mmHg MR Mean grad: 91.0 mmHg MR Vmax:      621.00 cm/s MR Vmean:     438.0 cm/s MV E velocity: 148.00 cm/s Arnoldo Hooker MD Electronically signed by Arnoldo Hooker MD Signature Date/Time: 01/03/2022/4:07:01 PM    Final    DG Chest Port 1 View  Result Date: 01/02/2022 CLINICAL DATA:  Shortness of breath and dry cough EXAM: PORTABLE CHEST 1 VIEW COMPARISON:  Radiographs 12/25/2013 FINDINGS: Increased interstitial prominence bilaterally with some airspace opacities in the lower lungs. Heart size at the upper limits of normal. No pleural effusion or pneumothorax. No acute osseous abnormality. IMPRESSION: Findings suggestive of interstitial pulmonary edema with atypical  infection considered less likely. Electronically Signed   By: Minerva Fester M.D.   On: 01/02/2022 20:54     Assessment and Recommendation  61 y.o. female with known hypertension hyperlipidemia and diabetes with acute diastolic dysfunction congestive heart failure and shortness of breath with severe aortic valve stenosis and no current evidence of myocardial infarction 1.  Continue medication management for further risk reduction cardiovascular event including above. 2.  Continue furosemide for pulmonary edema 3.  Proceed to cardiac catheterization to assess coronary anatomy and further treatment thereof is necessary.  Patient understands the risk and benefits of cardiac catheterization.  This includes the possibility of death stroke heart attack infection bleeding or blood clot.  She is at low risk for conscious sedation  Signed, Arnoldo Hooker M.D. FACC

## 2022-01-05 ENCOUNTER — Encounter: Payer: Self-pay | Admitting: Internal Medicine

## 2022-01-05 ENCOUNTER — Encounter
Admission: EM | Disposition: A | Discharge status: Other Acute Inpatient Hospital | Payer: Self-pay | Source: Home / Self Care | Attending: Osteopathic Medicine

## 2022-01-05 DIAGNOSIS — J9601 Acute respiratory failure with hypoxia: Secondary | ICD-10-CM | POA: Diagnosis not present

## 2022-01-05 DIAGNOSIS — I509 Heart failure, unspecified: Secondary | ICD-10-CM | POA: Diagnosis not present

## 2022-01-05 DIAGNOSIS — R778 Other specified abnormalities of plasma proteins: Secondary | ICD-10-CM | POA: Diagnosis not present

## 2022-01-05 DIAGNOSIS — I5031 Acute diastolic (congestive) heart failure: Secondary | ICD-10-CM | POA: Diagnosis not present

## 2022-01-05 HISTORY — PX: RIGHT/LEFT HEART CATH AND CORONARY ANGIOGRAPHY: CATH118266

## 2022-01-05 LAB — GLUCOSE, CAPILLARY
Glucose-Capillary: 128 mg/dL — ABNORMAL HIGH (ref 70–99)
Glucose-Capillary: 130 mg/dL — ABNORMAL HIGH (ref 70–99)
Glucose-Capillary: 105 mg/dL — ABNORMAL HIGH (ref 70–99)
Glucose-Capillary: 202 mg/dL — ABNORMAL HIGH (ref 70–99)

## 2022-01-05 LAB — CBC
HCT: 38.3 % (ref 36.0–46.0)
Hemoglobin: 12.7 g/dL (ref 12.0–15.0)
MCH: 28.9 pg (ref 26.0–34.0)
MCHC: 33.2 g/dL (ref 30.0–36.0)
MCV: 87 fL (ref 80.0–100.0)
Platelets: 165 10*3/uL (ref 150–400)
RBC: 4.4 MIL/uL (ref 3.87–5.11)
RDW: 12.5 % (ref 11.5–15.5)
WBC: 5.2 10*3/uL (ref 4.0–10.5)
nRBC: 0 % (ref 0.0–0.2)

## 2022-01-05 LAB — BASIC METABOLIC PANEL
Anion gap: 8 (ref 5–15)
BUN: 21 mg/dL (ref 8–23)
CO2: 27 mmol/L (ref 22–32)
Calcium: 8.6 mg/dL — ABNORMAL LOW (ref 8.9–10.3)
Chloride: 104 mmol/L (ref 98–111)
Creatinine, Ser: 0.59 mg/dL (ref 0.44–1.00)
GFR, Estimated: >60 mL/min (ref >60)
Glucose, Bld: 145 mg/dL — ABNORMAL HIGH (ref 70–99)
Potassium: 3.3 mmol/L — ABNORMAL LOW (ref 3.5–5.1)
Sodium: 139 mmol/L (ref 135–145)

## 2022-01-05 LAB — HEPARIN LEVEL (UNFRACTIONATED): Heparin Unfractionated: 0.35 IU/mL (ref 0.30–0.70)

## 2022-01-05 SURGERY — RIGHT/LEFT HEART CATH AND CORONARY ANGIOGRAPHY
Anesthesia: Moderate Sedation

## 2022-01-05 MED ORDER — MIDAZOLAM HCL 2 MG/2ML IJ SOLN
INTRAMUSCULAR | Status: AC
  Filled 2022-01-05: qty 2

## 2022-01-05 MED ORDER — LIDOCAINE HCL 1 % IJ SOLN
INTRAMUSCULAR | Status: AC
  Filled 2022-01-05: qty 20

## 2022-01-05 MED ORDER — SODIUM CHLORIDE 0.9 % WEIGHT BASED INFUSION: 1.0000 mL/kg/h | INTRAVENOUS | Status: DC

## 2022-01-05 MED ORDER — ASPIRIN 81 MG PO CHEW
81.0000 mg | CHEWABLE_TABLET | ORAL | Status: AC
  Administered 2022-01-05: 81 mg via ORAL
  Filled 2022-01-05: qty 1

## 2022-01-05 MED ORDER — SODIUM CHLORIDE 0.9% FLUSH: 3.0000 mL | INTRAVENOUS | Status: DC | PRN

## 2022-01-05 MED ORDER — IOHEXOL 300 MG/ML  SOLN
INTRAMUSCULAR | Status: DC | PRN
  Administered 2022-01-05: 90 mL

## 2022-01-05 MED ORDER — HEPARIN (PORCINE) IN NACL 1000-0.9 UT/500ML-% IV SOLN
INTRAVENOUS | Status: DC | PRN
  Administered 2022-01-05: 1000 mL

## 2022-01-05 MED ORDER — LABETALOL HCL 5 MG/ML IV SOLN: 10.0000 mg | INTRAVENOUS | Status: AC | PRN

## 2022-01-05 MED ORDER — HEPARIN (PORCINE) IN NACL 1000-0.9 UT/500ML-% IV SOLN
INTRAVENOUS | Status: AC
  Filled 2022-01-05: qty 1000

## 2022-01-05 MED ORDER — FENTANYL CITRATE (PF) 100 MCG/2ML IJ SOLN
INTRAMUSCULAR | Status: DC | PRN
  Administered 2022-01-05: 25 ug via INTRAVENOUS

## 2022-01-05 MED ORDER — SODIUM CHLORIDE 0.9 % IV SOLN: 250.0000 mL | INTRAVENOUS | Status: DC | PRN

## 2022-01-05 MED ORDER — HYDRALAZINE HCL 20 MG/ML IJ SOLN: 10.0000 mg | INTRAMUSCULAR | Status: AC | PRN

## 2022-01-05 MED ORDER — FUROSEMIDE 40 MG PO TABS: 40.0000 mg | ORAL_TABLET | Freq: Every day | ORAL | 11 refills | Status: DC

## 2022-01-05 MED ORDER — ACETAMINOPHEN 325 MG PO TABS: 650.0000 mg | ORAL_TABLET | ORAL | Status: DC | PRN

## 2022-01-05 MED ORDER — LIDOCAINE HCL (PF) 1 % IJ SOLN
INTRAMUSCULAR | Status: DC | PRN
  Administered 2022-01-05: 20 mL

## 2022-01-05 MED ORDER — SODIUM CHLORIDE 0.9 % WEIGHT BASED INFUSION
3.0000 mL/kg/h | INTRAVENOUS | Status: DC
  Administered 2022-01-05: 3 mL/kg/h via INTRAVENOUS

## 2022-01-05 MED ORDER — ONDANSETRON HCL 4 MG/2ML IJ SOLN: 4.0000 mg | Freq: Four times a day (QID) | INTRAMUSCULAR | Status: DC | PRN

## 2022-01-05 MED ORDER — TRAZODONE HCL 50 MG PO TABS: 25.0000 mg | ORAL_TABLET | Freq: Every evening | ORAL | 0 refills | Status: AC | PRN

## 2022-01-05 MED ORDER — MIDAZOLAM HCL 2 MG/2ML IJ SOLN
INTRAMUSCULAR | Status: DC | PRN
  Administered 2022-01-05: 1 mg via INTRAVENOUS

## 2022-01-05 MED ORDER — FENTANYL CITRATE (PF) 100 MCG/2ML IJ SOLN
INTRAMUSCULAR | Status: AC
  Filled 2022-01-05: qty 2

## 2022-01-05 SURGICAL SUPPLY — 12 items
CATH INFINITI 5FR MULTPACK ANG (CATHETERS) IMPLANT
CATH SWAN GANZ 7F STRAIGHT (CATHETERS) IMPLANT
DEVICE CLOSURE MYNXGRIP 6/7F (Vascular Products) IMPLANT
NDL PERC 18GX7CM (NEEDLE) IMPLANT
NEEDLE PERC 18GX7CM (NEEDLE) ×1 IMPLANT
PACK CARDIAC CATH (CUSTOM PROCEDURE TRAY) ×1 IMPLANT
PROTECTION STATION PRESSURIZED (MISCELLANEOUS) ×1
SET ATX SIMPLICITY (MISCELLANEOUS) IMPLANT
SHEATH AVANTI 6FR X 11CM (SHEATH) IMPLANT
SHEATH AVANTI 7FRX11 (SHEATH) IMPLANT
STATION PROTECTION PRESSURIZED (MISCELLANEOUS) IMPLANT
WIRE GUIDERIGHT .035X150 (WIRE) IMPLANT

## 2022-01-05 NOTE — Progress Notes (Signed)
Dr. Gwen Pounds at bedside, speaking with pt. And her spouse Darius re: cath results and need for an AVR. Both verbalize understanding of conversation.

## 2022-01-05 NOTE — Progress Notes (Signed)
  Transition of Care [facility]) Screening Note   Patient Details  Name: Nancy [name] Date of Birth: [birth year]   Transition of Care [facility]) CM/SW Contact:    Gildardo Griffes, LCSW Phone Number: 01/05/2022, 10:17 AM    Transition of Care Department [facility]) has reviewed patient and no TOC needs have been identified at this time. We will continue to monitor patient advancement through interdisciplinary progression rounds. If new patient transition needs arise, please place a TOC consult.  [place], [place] [phone]

## 2022-01-05 NOTE — Consult Note (Signed)
ANTICOAGULATION CONSULT NOTE  Pharmacy Consult for Heparin Indication: chest pain/ACS  Allergies  Allergen Reactions   Ibuprofen Other (See Comments)    Bleeding stomach   Levemir [Insulin Detemir] Other (See Comments)    Injection site reaction, bruise, knot up   Victoza [Liraglutide] Other (See Comments)    Almost went blind    Patient Measurements: Height: 5\' 3"  (160 cm) Weight: 81 kg (178 lb 9.6 oz) IBW/kg (Calculated) : 52.4 Heparin Dosing Weight: 70.6 kg  Vital Signs: Temp: 98.1 F (36.7 C) (09/11 0246) Temp Source: Oral (09/11 0005) BP: 105/75 (09/11 0246) Pulse Rate: 82 (09/11 0246)  Labs: Recent Labs    01/02/22 2016 01/02/22 2016 01/02/22 2017 01/02/22 2155 01/03/22 0456 01/03/22 1321 01/03/22 2001 01/04/22 0618 01/04/22 1344 01/04/22 2223 01/05/22 0417  HGB 15.2*  --   --   --  14.7  --   --  13.1  --   --  12.7  HCT 45.4  --   --   --  43.9  --   --  39.1  --   --  38.3  PLT 162  --   --   --  180  --   --  152  --   --  165  APTT  --   --  28  --   --   --   --   --   --   --   --   LABPROT  --   --  14.1  --   --   --   --   --   --   --   --   INR  --   --  1.1  --   --   --   --   --   --   --   --   HEPARINUNFRC  --    < >  --   --  0.17* 0.14*   < > 0.27* 0.30 0.32 0.35  CREATININE 0.67  --   --   --  0.69  --   --   --   --   --  0.59  TROPONINIHS 238*  --   --  197*  --  215*  --   --   --   --   --    < > = values in this interval not displayed.     Estimated Creatinine Clearance: 73.4 mL/min (by C-G formula based on SCr of 0.59 mg/dL).   Medical History: Past Medical History:  Diagnosis Date   Anxiety    Arthritis    Asthma    Depression    Diabetes mellitus without complication (HCC)    GERD (gastroesophageal reflux disease)    no meds   Heart murmur    History of kidney stones    h/o   Hypertension    Myocardial infarction [facility]) 2015   Pneumonia 2014   Seizures (HCC)    as a child-no since age 4   Severe aortic  stenosis     Medications:  No history of chronic AC use PTA  Assessment: Pharmacy has been consulted for heparin in 61yo female presenting to ED with SOB and dry cough. Abnormal EKG noted with critical troponin level of 238 recorded. Baseline labs have been ordered and are pending.   Goal of Therapy:  Heparin level 0.3-0.7 units/ml Monitor platelets by anticoagulation protocol: Yes   9/09 0456 HL  0.17, subtherapeutic 9/09 1321 HL  0.14,  subtherapeutic 9/09 2001 HL 0.26, subtherapeutic 9/10 0618 HL 0.27, subtherapeutic  9/10 1344 HL 0.30, slightly therapeutic 9/10 2223 HL 0.32, therapeutic x 2 9/11 0417 HL 0.35, therapeutic x 3   Plan:  Will continue heparin infusion at 1750 units/hr Recheck HL w/ AM labs daily while therapeutic Continue to monitor H&H and platelets  Otelia Sergeant, PharmD, [facility] 01/05/2022 5:33 AM

## 2022-01-05 NOTE — Discharge Summary (Signed)
Physician Discharge Summary   Patient: Nancy [name] MRN: [id number]  DOB: [birth year]   Admit:     Date of Admission: 01/02/2022 Admitted from: home   Discharge: Date of discharge: TBD pending bed availability at [facility] Disposition:  transfer to [facility] for aortic valve procedure  Condition at discharge: good  CODE STATUS: DNR     Discharge Physician: Sunnie Nielsen, DO [facility]     PCP: Hillery Aldo, MD  Recommendations for Outpatient Follow-up:  Pending clinical course at [facility]    Discharge Instructions     AMB Referral to Cardiac Rehabilitation - Phase II   Complete by: As directed    Diagnosis: Stable Angina         Discharge Diagnoses: Principal Problem:   (HFpEF) heart failure with preserved ejection fraction (HCC) Active Problems:   Acute CHF (congestive heart failure) (HCC)   Elevated troponin I level   Type 2 diabetes mellitus with peripheral neuropathy (HCC)   Essential hypertension   Acute respiratory failure with hypoxia [facility])       Hospital Course: Nancy [name] is a 61 y.o. female with medical history significant for anxiety, depression, type 2 diabetes mellitus, hypertension,, presented to the emergency room with acute onset of worsening dyspnea with associated dry cough and wheezing.  She admitted to dyspnea on exertion.  She has lower extremity edema without significant worsening.  She was having left sided chest pain felt as pressure without nausea or vomiting or diaphoresis 09/08 - early admission 09/09: Hypertensive, tachycardic, tachypneic, hypoxic 83% on room air to 93% on 4 L O2 [place].  Troponin 238 trended down to 197.  Lactic acid 2 trended down to 1.5.  CXR showed pulmonary edema.  Given Lasix IV 40 mg, started on IV heparin with bolus.  BNP 757.  Admitted for acute CHF, elevated troponin concern for NSTEMI versus demand ischemia from acute CHF.  Serial troponins ordered, echocardiogram ordered, cardiology consulted, Dr.  Gwen Pounds notified by admitting physician. 09/09: Oxygenating at 95% on room air, still tachycardic and mild tachypnea at 21 RR.  Slight elevation WBC to 11.5. Net IO Since Admission as documented: -594.06 mL [01/03/22 0732].  Cardiology planning for cardiac cath 09/11 (Monday, today is Saturday). No UOP documented today.  Echocardiogram: EF 55 to 60%, normal LV FXN, no RWMA, moderate LVH, grade 2 diastolic dysfunction 09/10 (Sunday): Remains on O2, no UOP documented yesterday, will trial weaning down on oxygen today.  Awaiting cardiac catheterization tomorrow. 09/11: Cardiac catheterization showing normal LV systolic function with left-ventricular hypertrophy and severe aortic valve stenosis as confirmed by echocardiogram.  She has mildly dilated aorta and minimal atherosclerosis of circumflex artery. Per cardiology, pt will benefit from aortic valve surgery - planning for transfer to [facility], cardiology working on this, awaiting bed at [facility] now.    Consultants:  Cardiology  Procedures: Cardiac catheterization 01/05/22      ASSESSMENT & PLAN:   Principal Problem:   (HFpEF) heart failure with preserved ejection fraction (HCC) Active Problems:   Acute CHF (congestive heart failure) (HCC)   Elevated troponin I level   Type 2 diabetes mellitus with peripheral neuropathy (HCC)   Essential hypertension   Acute respiratory failure with hypoxia (HCC)  CARDIAC: Acute CHF (congestive heart failure) (HCC) - HFpEF w/ Grade II DD Acute hypoxic respiratory failure due to CHF pulmonary edema Essential hypertension Aortic Valve Stenosis Elevated troponin I level d/t non-STEMI, vs demand ischemia from acute CHF given improvement of levels. Meeting  SIRS criteria without source of infection, sepsis ruled out IV Lasix. serial troponins. 2D echo: EF 55 to 60%, normal LV FXN, no RWMA, moderate LVH, grade 2 diastolic dysfunction cardiology consult, cardiac cath 09/11 as above Supplemental O2 to wean as  tolerated IV heparin pending cardiology recs - troponin is trending down, if continuing to trend down should be okay to DC heparin --> trended back up slightly, will leave patient on heparin until cardiology discontinues this. Nitro and morphine PRN continuing Toprol-XL high-dose statin therapy Aspirin Home antihypertensives also include amlodipine, losartan She is on Jardiance will continue Strict I&O, daily weights  Type 2 diabetes mellitus with peripheral neuropathy (HCC) supplemental coverage with NovoLog and will continue her basal coverage. Jardiance and Onglyza. Neurontin.       DVT prophylaxis: IV heparin may be able to discontinue and just have on regular DVT prophylaxis, if troponins continue to trend down/cardiology clearance Pertinent IV fluids/nutrition: No IV fluids, cardiac/carb diet Central lines / invasive devices: None  Code Status: DNR was confirmed by admitting hospitalist Family Communication: husband at bedside on rounds.   Disposition: Inpatient for now - transfer to [facility] pending  TOC needs: None at this time Barriers to discharge / significant pending items: transferring to [facility], awaiting bed there.             Discharge Instructions  Allergies as of 01/05/2022       Reactions   Ibuprofen Other (See Comments)   Bleeding stomach 01/05/2022 Pt. 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 blind         Scheduled Medications:   acidophilus   Oral Daily   amLODipine  10 mg Oral Q breakfast   aspirin EC  81 mg Oral Daily   empagliflozin  10 mg Oral Daily   furosemide  40 mg Intravenous Q12H   gabapentin  300 mg Oral QHS   insulin glargine-yfgn  34 Units Subcutaneous QHS   losartan  100 mg Oral Q  breakfast   metoprolol succinate  50 mg Oral Q breakfast   sodium chloride flush  3 mL Intravenous Q12H      Continuous Infusions:  sodium chloride 1 mL/kg/hr (01/05/22 1400)   heparin Stopped (01/05/22 1134)      PRN Medications:  acetaminophen, acetaminophen-codeine, albuterol, hydrALAZINE, labetalol, magnesium hydroxide, methocarbamol, ondansetron **OR** ondansetron (ZOFRAN) IV, traZODone   Antimicrobials:  Anti-infectives (From admission, onward)  Expand by Default      None            Allergies  Allergen Reactions   Ibuprofen Other (See Comments)    Bleeding stomach 01/05/2022 Pt. 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 NO chest pain / SOB   Discharge Exam: BP 115/80 (BP Location: Left Arm)   Pulse 91   Temp 98.2 F (36.8 C)   Resp 20   Ht  (1.6 m)   Wt 81 kg  LMP 09/14/2007 (Approximate)   SpO2 99%   BMI 31.63 kg/m  General: Pt is alert, awake, not in acute distress Cardiovascular: RRR, S1/S2 +loud systolic murmur 4/6, no rubs, no gallops Respiratory: CTA bilaterally, no wheezing, no rhonchi Abdominal: Soft, NT, ND, bowel sounds + Extremities: no edema, no cyanosis     The results of significant diagnostics from this hospitalization (including imaging, microbiology, ancillary and laboratory) are listed below for reference.     Microbiology: Recent Results (from the past 240 hour(s))  Resp Panel by RT-PCR (Flu A&B, Covid) Anterior Nasal Swab     Status: None   Collection Time: 01/02/22  8:17 PM   Specimen: Anterior Nasal Swab  Result Value Ref Range Status   SARS Coronavirus 2 by RT PCR NEGATIVE NEGATIVE Final    Comment:  (NOTE) SARS-CoV-2 target nucleic acids are NOT DETECTED.  The SARS-CoV-2 RNA is generally detectable in upper respiratory specimens during the acute phase of infection. The lowest concentration of SARS-CoV-2 viral copies this assay can detect is 138 copies/mL. A negative result does not preclude SARS-Cov-2 infection and should not be used as the sole basis for treatment or other patient management decisions. A negative result may occur with  improper specimen collection/handling, submission of specimen other than nasopharyngeal swab, presence of viral mutation(s) within the areas targeted by this assay, and inadequate number of viral copies(<138 copies/mL). A negative result must be combined with clinical observations, patient history, and epidemiological information. The expected result is Negative.  Fact Sheet for Patients:  BloggerCourse.com  Fact Sheet for Healthcare Providers:  SeriousBroker.it  This test is no t yet approved or cleared by the [place] FDA and  has been authorized for detection and/or diagnosis of SARS-CoV-2 by FDA under an Emergency Use Authorization (EUA). This EUA will remain  in effect (meaning this test can be used) for the duration of the COVID-19 declaration under Section 564(b)(1) of the Act, 21 U.S.C.section 360bbb-3(b)(1), unless the authorization is terminated  or revoked sooner.       Influenza A by PCR NEGATIVE NEGATIVE Final   Influenza B by PCR NEGATIVE NEGATIVE Final    Comment: (NOTE) The Xpert Xpress SARS-CoV-2/FLU/RSV plus assay is intended as an aid in the diagnosis of influenza from Nasopharyngeal swab specimens and should not be used as a sole basis for treatment. Nasal washings and aspirates are unacceptable for Xpert Xpress SARS-CoV-2/FLU/RSV testing.  Fact Sheet for Patients: BloggerCourse.com  Fact Sheet for Healthcare  Providers: SeriousBroker.it  This test is not yet approved or cleared by the [place] FDA and has been authorized for detection and/or diagnosis of SARS-CoV-2 by FDA under an Emergency Use Authorization (EUA). This EUA will remain in effect (meaning this test can be used) for the duration of the COVID-19 declaration under Section 564(b)(1) of the Act, 21 U.S.C. section 360bbb-3(b)(1), unless the authorization is terminated or revoked.  Performed at [facility], [address] [place], [place] [address]      Labs: BNP (last 3 results) Recent Labs    01/02/22 2016  BNP 757.2*   Basic Metabolic Panel: Recent Labs  Lab 01/02/22 2016 01/03/22 0456 01/05/22 0417  NA 137 138 139  K 4.1 4.0 3.3*  CL 103 102 104  CO2 24 25 27   GLUCOSE 218* 187* 145*  BUN 12 11 21   CREATININE 0.67 0.69 0.59  CALCIUM 8.6* 8.7* 8.6*   Liver Function Tests: Recent Labs  Lab 01/02/22 2016  AST 21  ALT 19  ALKPHOS 64  BILITOT 0.7  PROT 7.2  ALBUMIN 3.9   No results for input(s): "LIPASE", "AMYLASE" in the last 168 hours. No results for input(s): "AMMONIA" in the last 168 hours. CBC: Recent Labs  Lab 01/02/22 2016 01/03/22 0456 01/04/22 0618 01/05/22 0417  WBC 9.7 11.5* 8.2 5.2  NEUTROABS 7.1  --   --   --   HGB 15.2* 14.7 13.1 12.7  HCT 45.4 43.9 39.1 38.3  MCV 88.7 88.3 87.9 87.0  PLT 162 180 152 165   Cardiac Enzymes: No results for input(s): "CKTOTAL", "CKMB", "CKMBINDEX", "TROPONINI" in the last 168 hours. BNP: Invalid input(s): "POCBNP" CBG: Recent Labs  Lab 01/04/22 1633 01/04/22 2054 01/05/22 0915 01/05/22 1139 01/05/22 1330  GLUCAP 219* 179* 128* 130* 105*   D-Dimer No results for input(s): "DDIMER" in the last 72 hours. Hgb A1c Recent Labs    01/03/22 0456  HGBA1C 8.4*   Lipid Profile No results for input(s): "CHOL", "HDL", "LDLCALC", "TRIG", "CHOLHDL", "LDLDIRECT" in the last 72 hours. Thyroid function  studies No results for input(s): "TSH", "T4TOTAL", "T3FREE", "THYROIDAB" in the last 72 hours.  Invalid input(s): "FREET3" Anemia work up No results for input(s): "VITAMINB12", "FOLATE", "FERRITIN", "TIBC", "IRON", "RETICCTPCT" in the last 72 hours. Urinalysis    Component Value Date/Time   COLORURINE STRAW (A) 01/02/2022 2023   APPEARANCEUR CLEAR (A) 01/02/2022 2023   LABSPEC 1.031 (H) 01/02/2022 2023   PHURINE 7.0 01/02/2022 2023   GLUCOSEU >=500 (A) 01/02/2022 2023   HGBUR NEGATIVE 01/02/2022 2023   BILIRUBINUR NEGATIVE 01/02/2022 2023   KETONESUR NEGATIVE 01/02/2022 2023   PROTEINUR NEGATIVE 01/02/2022 2023   NITRITE NEGATIVE 01/02/2022 2023   LEUKOCYTESUR NEGATIVE 01/02/2022 2023   Sepsis Labs Recent Labs  Lab 01/02/22 2016 01/03/22 0456 01/04/22 0618 01/05/22 0417  WBC 9.7 11.5* 8.2 5.2   Microbiology Recent Results (from the past 240 hour(s))  Resp Panel by RT-PCR (Flu A&B, Covid) Anterior Nasal Swab     Status: None   Collection Time: 01/02/22  8:17 PM   Specimen: Anterior Nasal Swab  Result Value Ref Range Status   SARS Coronavirus 2 by RT PCR NEGATIVE NEGATIVE Final    Comment: (NOTE) SARS-CoV-2 target nucleic acids are NOT DETECTED.  The SARS-CoV-2 RNA is generally detectable in upper respiratory specimens during the acute phase of infection. The lowest concentration of SARS-CoV-2 viral copies this assay can detect is 138 copies/mL. A negative result does not preclude SARS-Cov-2 infection and should not be used as the sole basis for treatment or other patient management decisions. A negative result may occur with  improper specimen collection/handling, submission of specimen other than nasopharyngeal swab, presence of viral mutation(s) within the areas targeted by this assay, and inadequate number of viral copies(<138 copies/mL). A negative result must be combined with clinical observations, patient history, and epidemiological information. The expected  result is Negative.  Fact Sheet for Patients:  BloggerCourse.com  Fact Sheet for Healthcare Providers:  SeriousBroker.it  This test is no t yet approved or cleared by the [place] FDA and  has been authorized for detection and/or diagnosis of SARS-CoV-2 by FDA under an Emergency Use Authorization (EUA). This EUA will remain  in effect (meaning this test can be used) for the duration of the COVID-19 declaration under Section 564(b)(1) of the Act, 21 U.S.C.section 360bbb-3(b)(1), unless the authorization is terminated  or revoked sooner.       Influenza A by PCR NEGATIVE NEGATIVE Final   Influenza B by PCR  NEGATIVE NEGATIVE Final    Comment: (NOTE) The Xpert Xpress SARS-CoV-2/FLU/RSV plus assay is intended as an aid in the diagnosis of influenza from Nasopharyngeal swab specimens and should not be used as a sole basis for treatment. Nasal washings and aspirates are unacceptable for Xpert Xpress SARS-CoV-2/FLU/RSV testing.  Fact Sheet for Patients: BloggerCourse.com  Fact Sheet for Healthcare Providers: SeriousBroker.it  This test is not yet approved or cleared by the [place] FDA and has been authorized for detection and/or diagnosis of SARS-CoV-2 by FDA under an Emergency Use Authorization (EUA). This EUA will remain in effect (meaning this test can be used) for the duration of the COVID-19 declaration under Section 564(b)(1) of the Act, 21 U.S.C. section 360bbb-3(b)(1), unless the authorization is terminated or revoked.  Performed at [facility], [address] [place], [place] [address]    Imaging ECHOCARDIOGRAM COMPLETE  Result Date: 01/03/2022    ECHOCARDIOGRAM REPORT   Patient Name:   BURMA PURTEE Date of Exam: 01/03/2022 Medical Rec #:  [id number]       Height:       63.0 in Accession #:    [id number]      Weight:       182.0 lb Date of Birth:   26-Jan-1961      BSA:          1.858 m Patient Age:    60 years        BP:           120/90 mmHg Patient Gender: F               HR:           105 bpm. Exam Location:  [facility] Procedure: 2D Echo Indications:     CHF Acute Diastolic I50.31  History:         Patient has no prior history of Echocardiogram examinations.  Sonographer:     Overton Mam RDCS Referring Phys:  [id number] Vanessa Kick A MANSY Diagnosing Phys: Arnoldo Hooker MD IMPRESSIONS  1. Left ventricular ejection fraction, by estimation, is 55 to 60%. The left ventricle has normal function. The left ventricle has no regional wall motion abnormalities. There is moderate left ventricular hypertrophy. Left ventricular diastolic parameters are consistent with Grade II diastolic dysfunction (pseudonormalization).  2. Right ventricular systolic function is normal. The right ventricular size is normal.  3. Left atrial size was mildly dilated.  4. The mitral valve is normal in structure. Moderate mitral valve regurgitation.  5. The aortic valve is calcified. Aortic valve regurgitation is trivial. Severe aortic valve stenosis.  6. Aortic dilatation noted. FINDINGS  Left Ventricle: Left ventricular ejection fraction, by estimation, is 55 to 60%. The left ventricle has normal function. The left ventricle has no regional wall motion abnormalities. The left ventricular internal cavity size was normal in size. There is  moderate left ventricular hypertrophy. Left ventricular diastolic parameters are consistent with Grade II diastolic dysfunction (pseudonormalization). Right Ventricle: The right ventricular size is normal. No increase in right ventricular wall thickness. Right ventricular systolic function is normal. Left Atrium: Left atrial size was mildly dilated. Right Atrium: Right atrial size was normal in size. Pericardium: There is no evidence of pericardial effusion. Mitral Valve: The mitral valve is normal in structure. Moderate mitral valve regurgitation. MV peak  gradient, 14.4 mmHg. The mean mitral valve gradient is 4.0 mmHg. Tricuspid Valve: The tricuspid valve is normal in structure. Tricuspid valve regurgitation is mild. Aortic Valve: The aortic valve is  calcified. Aortic valve regurgitation is trivial. Aortic regurgitation PHT measures 239 msec. Severe aortic stenosis is present. Aortic valve mean gradient measures 67.2 mmHg. Aortic valve peak gradient measures 106.5 mmHg. Aortic valve area, by VTI measures 0.27 cm. Pulmonic Valve: The pulmonic valve was normal in structure. Pulmonic valve regurgitation is trivial. Aorta: Aortic dilatation noted. IAS/Shunts: No atrial level shunt detected by color flow Doppler.  LEFT VENTRICLE PLAX 2D LVIDd:         4.71 cm     Diastology LVIDs:         3.31 cm     LV e' medial:    10.00 cm/s LV PW:         2.03 cm     LV E/e' medial:  14.8 LV IVS:        1.60 cm     LV e' lateral:   7.07 cm/s LVOT diam:     1.80 cm     LV E/e' lateral: 20.9 LV SV:         30 LV SV Index:   16 LVOT Area:     2.54 cm  LV Volumes (MOD) LV vol d, MOD A2C: 56.5 ml LV vol d, MOD A4C: 93.4 ml LV vol s, MOD A2C: 24.8 ml LV vol s, MOD A4C: 36.6 ml LV SV MOD A2C:     31.7 ml LV SV MOD A4C:     93.4 ml LV SV MOD BP:      43.3 ml RIGHT VENTRICLE RV Basal diam:  2.72 cm RV S prime:     13.70 cm/s TAPSE (M-mode): 1.7 cm LEFT ATRIUM             Index        RIGHT ATRIUM          Index LA diam:        5.10 cm 2.75 cm/m   RA Area:     5.76 cm LA Vol (A2C):   31.0 ml 16.69 ml/m  RA Volume:   9.02 ml  4.86 ml/m LA Vol (A4C):   50.0 ml 26.92 ml/m LA Biplane Vol: 41.5 ml 22.34 ml/m  AORTIC VALVE                     PULMONIC VALVE AV Area (Vmax):    0.34 cm      PV Vmax:       0.98 m/s AV Area (Vmean):   0.29 cm      PV Peak grad:  3.8 mmHg AV Area (VTI):     0.27 cm AV Vmax:           516.00 cm/s AV Vmean:          386.000 cm/s AV VTI:            1.102 m AV Peak Grad:      106.5 mmHg AV Mean Grad:      67.2 mmHg LVOT Vmax:         69.00 cm/s LVOT Vmean:         44.000 cm/s LVOT VTI:          0.117 m LVOT/AV VTI ratio: 0.11 AI PHT:            239 msec  AORTA Ao Root diam: 2.40 cm Ao Asc diam:  2.90 cm MITRAL VALVE MV Area (PHT): 4.77 cm     SHUNTS MV Area VTI:   0.89 cm  Systemic VTI:  0.12 m MV Peak grad:  14.4 mmHg    Systemic Diam: 1.80 cm MV Mean grad:  4.0 mmHg MV Vmax:       1.90 m/s MV Vmean:      79.8 cm/s MV Decel Time: 159 msec MR Peak grad: 154.3 mmHg MR Mean grad: 91.0 mmHg MR Vmax:      621.00 cm/s MR Vmean:     438.0 cm/s MV E velocity: 148.00 cm/s Arnoldo Hooker MD Electronically signed by Arnoldo Hooker MD Signature Date/Time: 01/03/2022/4:07:01 PM    Final       Time coordinating discharge: over 30 minutes  SIGNED:  Sunnie Nielsen DO [facility]

## 2022-01-05 NOTE — Progress Notes (Signed)
[facility] Cardiology [facility] Encounter Note  Patient: Nancy [name] / Admit Date: 01/02/2022 / Date of Encounter: 01/05/2022, 1:25 PM   Subjective: Overall the patient has made improvements in his symptoms at this time with no evidence of chest discomfort or worsening congestive heart failure.  She does still have significant cardiac murmur with an echocardiogram showing normal LV systolic function and severe aortic valve stenosis.    Cardiac catheterization showing normal LV systolic function with left-ventricular hypertrophy and severe aortic valve stenosis as confirmed by echocardiogram.  She has mildly dilated aorta and minimal atherosclerosis of circumflex artery.  Review of Systems: Positive for: Shortness of breath Negative for: Vision change, hearing change, syncope, dizziness, nausea, vomiting,diarrhea, bloody stool, stomach pain, cough, congestion, diaphoresis, urinary frequency, urinary pain,skin lesions, skin rashes Others previously listed  Objective: Telemetry: Normal sinus rhythm Physical Exam: Blood pressure (!) 156/57, pulse 89, temperature 98.3 F (36.8 C), temperature source Oral, resp. rate 20, height 5\' 3"  (1.6 m), weight 81 kg, last menstrual period 09/14/2007, SpO2 95 %. Body mass index is 31.63 kg/m. General: Well developed, well nourished, in no acute distress. Head: Normocephalic, atraumatic, sclera non-icteric, no xanthomas, nares are without discharge. Neck: No apparent masses Lungs: Normal respirations with no wheezes, no rhonchi, no rales , no crackles   Heart: Regular rate and rhythm, normal S1 SOFT S2, 4+ aortic murmur, no rub, no gallop, PMI is normal size and placement, carotid upstroke normal without bruit, jugular venous pressure normal Abdomen: Soft, non-tender, non-distended with normoactive bowel sounds. No hepatosplenomegaly. Abdominal aorta is normal size without bruit Extremities: No edema, no clubbing, no cyanosis, no ulcers,  Peripheral: 2+  radial, 2+ femoral, 2+ dorsal pedal pulses Neuro: Alert and oriented. Moves all extremities spontaneously. Psych:  Responds to questions appropriately with a normal affect.   Intake/Output Summary (Last 24 hours) at 01/05/2022 1325 Last data filed at 01/05/2022 1100 Gross per 24 hour  Intake 888.06 ml  Output 2451 ml  Net -1562.94 ml     Inpatient Medications:   [MAR Hold] acidophilus   Oral Daily   [MAR Hold] amLODipine  10 mg Oral Q breakfast   [MAR Hold] aspirin EC  81 mg Oral Daily   [MAR Hold] empagliflozin  10 mg Oral Daily   [MAR Hold] furosemide  40 mg Intravenous Q12H   [MAR Hold] gabapentin  300 mg Oral QHS   [MAR Hold] insulin glargine-yfgn  34 Units Subcutaneous QHS   [MAR Hold] losartan  100 mg Oral Q breakfast   [MAR Hold] metoprolol succinate  50 mg Oral Q breakfast   [MAR Hold] sodium chloride flush  3 mL Intravenous Q12H   Infusions:   sodium chloride     [START ON 01/06/2022] sodium chloride 3 mL/kg/hr (01/05/22 1139)   Followed by   [START ON 01/06/2022] sodium chloride     heparin Stopped (01/05/22 1134)    Labs: Recent Labs    01/03/22 0456 01/05/22 0417  NA 138 139  K 4.0 3.3*  CL 102 104  CO2 25 27  GLUCOSE 187* 145*  BUN 11 21  CREATININE 0.69 0.59  CALCIUM 8.7* 8.6*    Recent Labs    01/02/22 2016  AST 21  ALT 19  ALKPHOS 64  BILITOT 0.7  PROT 7.2  ALBUMIN 3.9    Recent Labs    01/02/22 2016 01/03/22 0456 01/04/22 0618 01/05/22 0417  WBC 9.7   < > 8.2 5.2  NEUTROABS 7.1  --   --   --  HGB 15.2*   < > 13.1 12.7  HCT 45.4   < > 39.1 38.3  MCV 88.7   < > 87.9 87.0  PLT 162   < > 152 165   < > = values in this interval not displayed.    No results for input(s): "CKTOTAL", "CKMB", "TROPONINI" in the last 72 hours. Invalid input(s): "POCBNP" Recent Labs    01/03/22 0456  HGBA1C 8.4*      Weights: Filed Weights   01/02/22 2000 01/04/22 0433 01/05/22 1140  Weight: 82.6 kg 81 kg 81 kg     Radiology/Studies:   ECHOCARDIOGRAM COMPLETE  Result Date: 01/03/2022    ECHOCARDIOGRAM REPORT   Patient Name:   NUR RABOLD Date of Exam: 01/03/2022 Medical Rec #:  [id number]       Height:       63.0 in Accession #:    [id number]      Weight:       182.0 lb Date of Birth:  1961/02/13      BSA:          1.858 m Patient Age:    60 years        BP:           120/90 mmHg Patient Gender: F               HR:           105 bpm. Exam Location:  [facility] Procedure: 2D Echo Indications:     CHF Acute Diastolic I50.31  History:         Patient has no prior history of Echocardiogram examinations.  Sonographer:     Overton Mam RDCS Referring Phys:  [id number] Vanessa Kick A MANSY Diagnosing Phys: Arnoldo Hooker MD IMPRESSIONS  1. Left ventricular ejection fraction, by estimation, is 55 to 60%. The left ventricle has normal function. The left ventricle has no regional wall motion abnormalities. There is moderate left ventricular hypertrophy. Left ventricular diastolic parameters are consistent with Grade II diastolic dysfunction (pseudonormalization).  2. Right ventricular systolic function is normal. The right ventricular size is normal.  3. Left atrial size was mildly dilated.  4. The mitral valve is normal in structure. Moderate mitral valve regurgitation.  5. The aortic valve is calcified. Aortic valve regurgitation is trivial. Severe aortic valve stenosis.  6. Aortic dilatation noted. FINDINGS  Left Ventricle: Left ventricular ejection fraction, by estimation, is 55 to 60%. The left ventricle has normal function. The left ventricle has no regional wall motion abnormalities. The left ventricular internal cavity size was normal in size. There is  moderate left ventricular hypertrophy. Left ventricular diastolic parameters are consistent with Grade II diastolic dysfunction (pseudonormalization). Right Ventricle: The right ventricular size is normal. No increase in right ventricular wall thickness. Right ventricular systolic function is normal. Left  Atrium: Left atrial size was mildly dilated. Right Atrium: Right atrial size was normal in size. Pericardium: There is no evidence of pericardial effusion. Mitral Valve: The mitral valve is normal in structure. Moderate mitral valve regurgitation. MV peak gradient, 14.4 mmHg. The mean mitral valve gradient is 4.0 mmHg. Tricuspid Valve: The tricuspid valve is normal in structure. Tricuspid valve regurgitation is mild. Aortic Valve: The aortic valve is calcified. Aortic valve regurgitation is trivial. Aortic regurgitation PHT measures 239 msec. Severe aortic stenosis is present. Aortic valve mean gradient measures 67.2 mmHg. Aortic valve peak gradient measures 106.5 mmHg. Aortic valve area, by VTI measures 0.27 cm. Pulmonic Valve: The pulmonic valve  was normal in structure. Pulmonic valve regurgitation is trivial. Aorta: Aortic dilatation noted. IAS/Shunts: No atrial level shunt detected by color flow Doppler.  LEFT VENTRICLE PLAX 2D LVIDd:         4.71 cm     Diastology LVIDs:         3.31 cm     LV e' medial:    10.00 cm/s LV PW:         2.03 cm     LV E/e' medial:  14.8 LV IVS:        1.60 cm     LV e' lateral:   7.07 cm/s LVOT diam:     1.80 cm     LV E/e' lateral: 20.9 LV SV:         30 LV SV Index:   16 LVOT Area:     2.54 cm  LV Volumes (MOD) LV vol d, MOD A2C: 56.5 ml LV vol d, MOD A4C: 93.4 ml LV vol s, MOD A2C: 24.8 ml LV vol s, MOD A4C: 36.6 ml LV SV MOD A2C:     31.7 ml LV SV MOD A4C:     93.4 ml LV SV MOD BP:      43.3 ml RIGHT VENTRICLE RV Basal diam:  2.72 cm RV S prime:     13.70 cm/s TAPSE (M-mode): 1.7 cm LEFT ATRIUM             Index        RIGHT ATRIUM          Index LA diam:        5.10 cm 2.75 cm/m   RA Area:     5.76 cm LA Vol (A2C):   31.0 ml 16.69 ml/m  RA Volume:   9.02 ml  4.86 ml/m LA Vol (A4C):   50.0 ml 26.92 ml/m LA Biplane Vol: 41.5 ml 22.34 ml/m  AORTIC VALVE                     PULMONIC VALVE AV Area (Vmax):    0.34 cm      PV Vmax:       0.98 m/s AV Area (Vmean):   0.29 cm       PV Peak grad:  3.8 mmHg AV Area (VTI):     0.27 cm AV Vmax:           516.00 cm/s AV Vmean:          386.000 cm/s AV VTI:            1.102 m AV Peak Grad:      106.5 mmHg AV Mean Grad:      67.2 mmHg LVOT Vmax:         69.00 cm/s LVOT Vmean:        44.000 cm/s LVOT VTI:          0.117 m LVOT/AV VTI ratio: 0.11 AI PHT:            239 msec  AORTA Ao Root diam: 2.40 cm Ao Asc diam:  2.90 cm MITRAL VALVE MV Area (PHT): 4.77 cm     SHUNTS MV Area VTI:   0.89 cm     Systemic VTI:  0.12 m MV Peak grad:  14.4 mmHg    Systemic Diam: 1.80 cm MV Mean grad:  4.0 mmHg MV Vmax:       1.90 m/s MV Vmean:      79.8 cm/s MV  Decel Time: 159 msec MR Peak grad: 154.3 mmHg MR Mean grad: 91.0 mmHg MR Vmax:      621.00 cm/s MR Vmean:     438.0 cm/s MV E velocity: 148.00 cm/s Arnoldo Hooker MD Electronically signed by Arnoldo Hooker MD Signature Date/Time: 01/03/2022/4:07:01 PM    Final    DG Chest Port 1 View  Result Date: 01/02/2022 CLINICAL DATA:  Shortness of breath and dry cough EXAM: PORTABLE CHEST 1 VIEW COMPARISON:  Radiographs 12/25/2013 FINDINGS: Increased interstitial prominence bilaterally with some airspace opacities in the lower lungs. Heart size at the upper limits of normal. No pleural effusion or pneumothorax. No acute osseous abnormality. IMPRESSION: Findings suggestive of interstitial pulmonary edema with atypical infection considered less likely. Electronically Signed   By: Minerva Fester M.D.   On: 01/02/2022 20:54     Assessment and Recommendation  61 y.o. female with known hypertension hyperlipidemia and diabetes with acute diastolic dysfunction congestive heart failure and shortness of breath with severe aortic valve stenosis and no current evidence of myocardial infarction and minimal atherosclerosis needing aortic valve replacement 1.  Continue medication management for further risk reduction cardiovascular event including above. 2.  Continue furosemide for pulmonary edema 3.  After discussion with  cardiovascular team we have the conclusion that the patient would best benefit from aortic valve surgery.  The patient will be transferred to [facility] for aortic valve replacement and further treatment options thereafter.  [facility] and transfer orders and discussion with [facility] have already occurred and waiting to have bed available.  Signed, Arnoldo Hooker M.D. FACC

## 2022-01-05 NOTE — Progress Notes (Signed)
PROGRESS NOTE    Nancy [name]   DSK:[id number] DOB: Jun 24, 1960  DOA: 01/02/2022 Date of Service: 01/05/22 PCP: Hillery Aldo, MD     Brief Narrative / Hospital Course:  Nancy [name] is a 61 y.o. female with medical history significant for anxiety, depression, type 2 diabetes mellitus, hypertension,, presented to the emergency room with acute onset of worsening dyspnea with associated dry cough and wheezing.  She admitted to dyspnea on exertion.  She has lower extremity edema without significant worsening.  She was having left sided chest pain felt as pressure without nausea or vomiting or diaphoresis 09/08 - early admission 09/09: Hypertensive, tachycardic, tachypneic, hypoxic 83% on room air to 93% on 4 L O2 [place].  Troponin 238 trended down to 197.  Lactic acid 2 trended down to 1.5.  CXR showed pulmonary edema.  Given Lasix IV 40 mg, started on IV heparin with bolus.  BNP 757.  Admitted for acute CHF, elevated troponin concern for NSTEMI versus demand ischemia from acute CHF.  Serial troponins ordered, echocardiogram ordered, cardiology consulted, Dr. Gwen Pounds notified by admitting physician. 09/09: Oxygenating at 95% on room air, still tachycardic and mild tachypnea at 21 RR.  Slight elevation WBC to 11.5. Net IO Since Admission as documented: -594.06 mL [01/03/22 0732].  Cardiology planning for cardiac cath 09/11 (Monday, today is Saturday). No UOP documented today.  Echocardiogram: EF 55 to 60%, normal LV FXN, no RWMA, moderate LVH, grade 2 diastolic dysfunction 09/10 (Sunday): Remains on O2, no UOP documented yesterday, will trial weaning down on oxygen today.  Awaiting cardiac catheterization tomorrow. 09/11: Cardiac catheterization showing normal LV systolic function with left-ventricular hypertrophy and severe aortic valve stenosis as confirmed by echocardiogram.  She has mildly dilated aorta and minimal atherosclerosis of circumflex artery. Per cardiology, pt will benefit from aortic  valve surgery - planning for transfer to [facility], cardiology working on this, awaiting bed at [facility] now.    Consultants:  Cardiology  Procedures: Cardiac catheterization 01/05/22      ASSESSMENT & PLAN:   Principal Problem:   (HFpEF) heart failure with preserved ejection fraction (HCC) Active Problems:   Acute CHF (congestive heart failure) (HCC)   Elevated troponin I level   Type 2 diabetes mellitus with peripheral neuropathy (HCC)   Essential hypertension   Acute respiratory failure with hypoxia (HCC)  CARDIAC: Acute CHF (congestive heart failure) (HCC) - HFpEF w/ Grade II DD Acute hypoxic respiratory failure due to CHF pulmonary edema Essential hypertension Aortic Valve Stenosis Elevated troponin I level d/t non-STEMI, vs demand ischemia from acute CHF given improvement of levels. Meeting SIRS criteria without source of infection, sepsis ruled out IV Lasix. serial troponins. 2D echo: EF 55 to 60%, normal LV FXN, no RWMA, moderate LVH, grade 2 diastolic dysfunction cardiology consult, cardiac cath 09/11 as above Supplemental O2 to wean as tolerated IV heparin pending cardiology recs - troponin is trending down, if continuing to trend down should be okay to DC heparin --> trended back up slightly, will leave patient on heparin until cardiology discontinues this. Nitro and morphine PRN continuing Toprol-XL high-dose statin therapy Aspirin Home antihypertensives also include amlodipine, losartan She is on Jardiance will continue Strict I&O, daily weights  Type 2 diabetes mellitus with peripheral neuropathy (HCC) supplemental coverage with NovoLog and will continue her basal coverage. Jardiance and Onglyza. Neurontin.       DVT prophylaxis: IV heparin may be able to discontinue and just have on regular DVT prophylaxis, if troponins continue to trend  down/cardiology clearance Pertinent IV fluids/nutrition: No IV fluids, cardiac/carb diet Central lines / invasive  devices: None  Code Status: DNR was confirmed by admitting hospitalist Family Communication: husband at bedside on rounds.   Disposition: Inpatient for now - transfer to [facility] pending  TOC needs: None at this time Barriers to discharge / significant pending items: transferring to [facility], awaiting bed there.              Subjective:  Patient reports doing well today, no concerns. NO chest pain / SOB       Objective:  Vitals:   01/05/22 0625 01/05/22 0900 01/05/22 1140 01/05/22 1330  BP: 111/71 (!) 156/57  118/84  Pulse: 84 80 89 90  Resp: 16 20 20 17   Temp:  98.3 F (36.8 C) 98.3 F (36.8 C)   TempSrc:  Oral Oral   SpO2:  99% 95% 96%  Weight:   81 kg   Height:   5\' 3"  (1.6 m)     Intake/Output Summary (Last 24 hours) at 01/05/2022 1407 Last data filed at 01/05/2022 1400 Gross per 24 hour  Intake 1128.06 ml  Output 2451 ml  Net -1322.94 ml    Filed Weights   01/02/22 2000 01/04/22 0433 01/05/22 1140  Weight: 82.6 kg 81 kg 81 kg    Examination:  Constitutional:  VS as above General Appearance: alert, well-developed, well-nourished, NAD Eyes: Normal lids and conjunctive, non-icteric sclera Ears, Nose, Mouth, Throat: Normal external appearance MMM Neck: No masses, trachea midline Respiratory: Normal respiratory effort No wheeze No rhonchi Slight basal rales bilaterally Cardiovascular: S1/S2 normal (+)loud systolic murmur stable from previous  No rub/gallop auscultated No JVD No lower extremity edema Musculoskeletal:  No clubbing/cyanosis of digits Gait normal Symmetrical movement in all extremities Neurological: No cranial nerve deficit on limited exam Alert Psychiatric: Normal judgment/insight Normal mood and affect       Scheduled Medications:   acidophilus   Oral Daily   amLODipine  10 mg Oral Q breakfast   aspirin EC  81 mg Oral Daily   empagliflozin  10 mg Oral Daily   furosemide  40 mg Intravenous Q12H   gabapentin  300  mg Oral QHS   insulin glargine-yfgn  34 Units Subcutaneous QHS   losartan  100 mg Oral Q breakfast   metoprolol succinate  50 mg Oral Q breakfast   sodium chloride flush  3 mL Intravenous Q12H    Continuous Infusions:  sodium chloride 1 mL/kg/hr (01/05/22 1400)   heparin Stopped (01/05/22 1134)    PRN Medications:  acetaminophen, acetaminophen-codeine, albuterol, hydrALAZINE, labetalol, magnesium hydroxide, methocarbamol, ondansetron **OR** ondansetron (ZOFRAN) IV, traZODone  Antimicrobials:  Anti-infectives (From admission, onward)    None       Data Reviewed: I have personally reviewed following labs and imaging studies  CBC: Recent Labs  Lab 01/02/22 2016 01/03/22 0456 01/04/22 0618 01/05/22 0417  WBC 9.7 11.5* 8.2 5.2  NEUTROABS 7.1  --   --   --   HGB 15.2* 14.7 13.1 12.7  HCT 45.4 43.9 39.1 38.3  MCV 88.7 88.3 87.9 87.0  PLT 162 180 152 165    Basic Metabolic Panel: Recent Labs  Lab 01/02/22 2016 01/03/22 0456 01/05/22 0417  NA 137 138 139  K 4.1 4.0 3.3*  CL 103 102 104  CO2 24 25 27   GLUCOSE 218* 187* 145*  BUN 12 11 21   CREATININE 0.67 0.69 0.59  CALCIUM 8.6* 8.7* 8.6*    GFR: Estimated Creatinine Clearance:  73.4 mL/min (by C-G formula based on SCr of 0.59 mg/dL). Liver Function Tests: Recent Labs  Lab 01/02/22 2016  AST 21  ALT 19  ALKPHOS 64  BILITOT 0.7  PROT 7.2  ALBUMIN 3.9    No results for input(s): "LIPASE", "AMYLASE" in the last 168 hours. No results for input(s): "AMMONIA" in the last 168 hours. Coagulation Profile: Recent Labs  Lab 01/02/22 2017  INR 1.1    Cardiac Enzymes: No results for input(s): "CKTOTAL", "CKMB", "CKMBINDEX", "TROPONINI" in the last 168 hours. BNP (last 3 results) No results for input(s): "PROBNP" in the last 8760 hours. HbA1C: Recent Labs    01/03/22 0456  HGBA1C 8.4*    CBG: Recent Labs  Lab 01/04/22 1633 01/04/22 2054 01/05/22 0915 01/05/22 1139 01/05/22 1330  GLUCAP 219* 179*  128* 130* 105*    Lipid Profile: No results for input(s): "CHOL", "HDL", "LDLCALC", "TRIG", "CHOLHDL", "LDLDIRECT" in the last 72 hours. Thyroid Function Tests: No results for input(s): "TSH", "T4TOTAL", "FREET4", "T3FREE", "THYROIDAB" in the last 72 hours. Anemia Panel: No results for input(s): "VITAMINB12", "FOLATE", "FERRITIN", "TIBC", "IRON", "RETICCTPCT" in the last 72 hours. Urine analysis:    Component Value Date/Time   COLORURINE STRAW (A) 01/02/2022 2023   APPEARANCEUR CLEAR (A) 01/02/2022 2023   LABSPEC 1.031 (H) 01/02/2022 2023   PHURINE 7.0 01/02/2022 2023   GLUCOSEU >=500 (A) 01/02/2022 2023   HGBUR NEGATIVE 01/02/2022 2023   BILIRUBINUR NEGATIVE 01/02/2022 2023   KETONESUR NEGATIVE 01/02/2022 2023   PROTEINUR NEGATIVE 01/02/2022 2023   NITRITE NEGATIVE 01/02/2022 2023   LEUKOCYTESUR NEGATIVE 01/02/2022 2023   Sepsis Labs: @LABRCNTIP (procalcitonin:4,lacticidven:4)  Recent Results (from the past 240 hour(s))  Resp Panel by RT-PCR (Flu A&B, Covid) Anterior Nasal Swab     Status: None   Collection Time: 01/02/22  8:17 PM   Specimen: Anterior Nasal Swab  Result Value Ref Range Status   SARS Coronavirus 2 by RT PCR NEGATIVE NEGATIVE Final    Comment: (NOTE) SARS-CoV-2 target nucleic acids are NOT DETECTED.  The SARS-CoV-2 RNA is generally detectable in upper respiratory specimens during the acute phase of infection. The lowest concentration of SARS-CoV-2 viral copies this assay can detect is 138 copies/mL. A negative result does not preclude SARS-Cov-2 infection and should not be used as the sole basis for treatment or other patient management decisions. A negative result may occur with  improper specimen collection/handling, submission of specimen other than nasopharyngeal swab, presence of viral mutation(s) within the areas targeted by this assay, and inadequate number of viral copies(<138 copies/mL). A negative result must be combined with clinical  observations, patient history, and epidemiological information. The expected result is Negative.  Fact Sheet for Patients:  03/04/22  Fact Sheet for Healthcare Providers:  BloggerCourse.com  This test is no t yet approved or cleared by the [employer] FDA and  has been authorized for detection and/or diagnosis of SARS-CoV-2 by FDA under an Emergency Use Authorization (EUA). This EUA will remain  in effect (meaning this test can be used) for the duration of the COVID-19 declaration under Section 564(b)(1) of the Act, 21 U.S.C.section 360bbb-3(b)(1), unless the authorization is terminated  or revoked sooner.       Influenza A by PCR NEGATIVE NEGATIVE Final   Influenza B by PCR NEGATIVE NEGATIVE Final    Comment: (NOTE) The Xpert Xpress SARS-CoV-2/FLU/RSV plus assay is intended as an aid in the diagnosis of influenza from Nasopharyngeal swab specimens and should not be used as a sole basis  for treatment. Nasal washings and aspirates are unacceptable for Xpert Xpress SARS-CoV-2/FLU/RSV testing.  Fact Sheet for Patients: BloggerCourse.com  Fact Sheet for Healthcare Providers: SeriousBroker.it  This test is not yet approved or cleared by the [place] FDA and has been authorized for detection and/or diagnosis of SARS-CoV-2 by FDA under an Emergency Use Authorization (EUA). This EUA will remain in effect (meaning this test can be used) for the duration of the COVID-19 declaration under Section 564(b)(1) of the Act, 21 U.S.C. section 360bbb-3(b)(1), unless the authorization is terminated or revoked.  Performed at [facility], [address], [place], [place] [address]          Radiology Studies: ECHOCARDIOGRAM COMPLETE  Result Date: 01/03/2022    ECHOCARDIOGRAM REPORT   Patient Name:   LYANDRA MCPHAIL Date of Exam: 01/03/2022 Medical Rec #:  [id number]        Height:       63.0 in Accession #:    [id number]      Weight:       182.0 lb Date of Birth:  January 15, 1961      BSA:          1.858 m Patient Age:    60 years        BP:           120/90 mmHg Patient Gender: F               HR:           105 bpm. Exam Location:  [facility] Procedure: 2D Echo Indications:     CHF Acute Diastolic I50.31  History:         Patient has no prior history of Echocardiogram examinations.  Sonographer:     Overton Mam RDCS Referring Phys:  [id number] Vanessa Kick A MANSY Diagnosing Phys: Arnoldo Hooker MD IMPRESSIONS  1. Left ventricular ejection fraction, by estimation, is 55 to 60%. The left ventricle has normal function. The left ventricle has no regional wall motion abnormalities. There is moderate left ventricular hypertrophy. Left ventricular diastolic parameters are consistent with Grade II diastolic dysfunction (pseudonormalization).  2. Right ventricular systolic function is normal. The right ventricular size is normal.  3. Left atrial size was mildly dilated.  4. The mitral valve is normal in structure. Moderate mitral valve regurgitation.  5. The aortic valve is calcified. Aortic valve regurgitation is trivial. Severe aortic valve stenosis.  6. Aortic dilatation noted. FINDINGS  Left Ventricle: Left ventricular ejection fraction, by estimation, is 55 to 60%. The left ventricle has normal function. The left ventricle has no regional wall motion abnormalities. The left ventricular internal cavity size was normal in size. There is  moderate left ventricular hypertrophy. Left ventricular diastolic parameters are consistent with Grade II diastolic dysfunction (pseudonormalization). Right Ventricle: The right ventricular size is normal. No increase in right ventricular wall thickness. Right ventricular systolic function is normal. Left Atrium: Left atrial size was mildly dilated. Right Atrium: Right atrial size was normal in size. Pericardium: There is no evidence of pericardial effusion. Mitral  Valve: The mitral valve is normal in structure. Moderate mitral valve regurgitation. MV peak gradient, 14.4 mmHg. The mean mitral valve gradient is 4.0 mmHg. Tricuspid Valve: The tricuspid valve is normal in structure. Tricuspid valve regurgitation is mild. Aortic Valve: The aortic valve is calcified. Aortic valve regurgitation is trivial. Aortic regurgitation PHT measures 239 msec. Severe aortic stenosis is present. Aortic valve mean gradient measures 67.2 mmHg. Aortic valve peak gradient measures 106.5  mmHg. Aortic valve area, by VTI measures 0.27 cm. Pulmonic Valve: The pulmonic valve was normal in structure. Pulmonic valve regurgitation is trivial. Aorta: Aortic dilatation noted. IAS/Shunts: No atrial level shunt detected by color flow Doppler.  LEFT VENTRICLE PLAX 2D LVIDd:         4.71 cm     Diastology LVIDs:         3.31 cm     LV e' medial:    10.00 cm/s LV PW:         2.03 cm     LV E/e' medial:  14.8 LV IVS:        1.60 cm     LV e' lateral:   7.07 cm/s LVOT diam:     1.80 cm     LV E/e' lateral: 20.9 LV SV:         30 LV SV Index:   16 LVOT Area:     2.54 cm  LV Volumes (MOD) LV vol d, MOD A2C: 56.5 ml LV vol d, MOD A4C: 93.4 ml LV vol s, MOD A2C: 24.8 ml LV vol s, MOD A4C: 36.6 ml LV SV MOD A2C:     31.7 ml LV SV MOD A4C:     93.4 ml LV SV MOD BP:      43.3 ml RIGHT VENTRICLE RV Basal diam:  2.72 cm RV S prime:     13.70 cm/s TAPSE (M-mode): 1.7 cm LEFT ATRIUM             Index        RIGHT ATRIUM          Index LA diam:        5.10 cm 2.75 cm/m   RA Area:     5.76 cm LA Vol (A2C):   31.0 ml 16.69 ml/m  RA Volume:   9.02 ml  4.86 ml/m LA Vol (A4C):   50.0 ml 26.92 ml/m LA Biplane Vol: 41.5 ml 22.34 ml/m  AORTIC VALVE                     PULMONIC VALVE AV Area (Vmax):    0.34 cm      PV Vmax:       0.98 m/s AV Area (Vmean):   0.29 cm      PV Peak grad:  3.8 mmHg AV Area (VTI):     0.27 cm AV Vmax:           516.00 cm/s AV Vmean:          386.000 cm/s AV VTI:            1.102 m AV Peak Grad:       106.5 mmHg AV Mean Grad:      67.2 mmHg LVOT Vmax:         69.00 cm/s LVOT Vmean:        44.000 cm/s LVOT VTI:          0.117 m LVOT/AV VTI ratio: 0.11 AI PHT:            239 msec  AORTA Ao Root diam: 2.40 cm Ao Asc diam:  2.90 cm MITRAL VALVE MV Area (PHT): 4.77 cm     SHUNTS MV Area VTI:   0.89 cm     Systemic VTI:  0.12 m MV Peak grad:  14.4 mmHg    Systemic Diam: 1.80 cm MV Mean grad:  4.0 mmHg MV Vmax:  1.90 m/s MV Vmean:      79.8 cm/s MV Decel Time: 159 msec MR Peak grad: 154.3 mmHg MR Mean grad: 91.0 mmHg MR Vmax:      621.00 cm/s MR Vmean:     438.0 cm/s MV E velocity: 148.00 cm/s Arnoldo Hooker MD Electronically signed by Arnoldo Hooker MD Signature Date/Time: 01/03/2022/4:07:01 PM    Final             LOS: 3 days       Sunnie Nielsen, DO [facility] 01/05/2022, 2:07 PM   Staff may message me via secure chat in Epic  but this may not receive immediate response,  please page for urgent matters!  If 7PM-7AM, please contact night-coverage [email or web address]  Dictation software was used to generate the above note. Typos may occur and escape review, as with typed/written notes. Please contact Dr Lyn Hollingshead directly for clarity if needed.

## 2022-01-08 DIAGNOSIS — Z952 Presence of prosthetic heart valve: Secondary | ICD-10-CM | POA: Insufficient documentation

## 2022-01-14 NOTE — Progress Notes (Deleted)
   Patient ID: OSCEOLA HOLIAN, female    DOB: 06-Sep-1960, 61 y.o.   MRN: [id number]  HPI  Ms Silberman is a 61 y/o female with a history of  Echo report from 01/06/22 reviewed and showed an EF of >55% and critical AS. Echo report from 01/03/22 reviewed and showed an EF of 55-60% along with moderate LVH, mild LAE and moderate MR.   RHC/ LHC done 01/05/22 and showed:   Ost Cx to Prox Cx lesion is 40% stenosed.   Prox LAD lesion is 20% stenosed.   The left ventricular systolic function is normal.   LV end diastolic pressure is mildly elevated.   The left ventricular ejection fraction is greater than 65% by visual estimate.   There is severe aortic valve stenosis. There is trivial (1+) aortic regurgitation.   There is mild (2+) mitral regurgitation.   There is mild (2+) mitral regurgitation.   61 year old female with known hypertension and progressing aortic valve stenosis who has been admitted to the hospital for acute diastolic dysfunction congestive heart failure with mild pulmonary edema.  The patient has had significant improvements in pulmonary edema and shortness of breath based on urine output. Echocardiogram showing normal LV systolic function with moderate to severe left ventricular hypertrophy and severe aortic valve stenosis with a peak gradient of 5 m/s, peak pressure of 100 mm, mean pressure of 64 mm.  This is confirmed by cardiac catheterization  Aorta with mild dilation  Coronary arteries with minimal atherosclerosis throughout and no evidence of significant stenosis  Right heart pressures show mean PA of 33 mm, RV 47/4 mm RA 12/8 mm with a mean of 7 mm, pulmonary capillary wedge 27/18 with a mean of 21 mm  After discussion with cardiovascular team it is apparent that the patient will need aortic valve replacement  Admitted 01/05/22 for aortic valve replacement. Discharged home after 1 week. Admitted 01/02/22 due to worsening SOB, cough, wheezing and pedal edema. Given IV lasix and  heparin drip. Troponin elevated. Cardiology consult obtained. Cath and echo done. Transferred in 3 days for aortic valve surgery.   She presents today for her initial visit with a chief complaint of   Review of Systems    Physical Exam    Assessment & Plan:  1: Chronic heart failure with preserve ejection fraction with structural changes (LVH)- - NYHA class - saw cardiology Petra Kuba) 08/28/21 - BNP 01/02/22 was 757.2  2: HTN- - BP - saw PCP Posey Pronto) - BMP 01/12/22 reviewed and showed sodium 138, potassium 4.4, creatinine 0.7 & GFR 98  3: DM- - A1c 01/03/22 was 8.4%  4: AVR- - aortic valve replaced 01/08/22

## 2022-01-15 ENCOUNTER — Ambulatory Visit: Payer: Medicaid Other | Admitting: Family

## 2022-01-15 ENCOUNTER — Telehealth: Payer: Self-pay | Admitting: Family

## 2022-01-15 NOTE — Telephone Encounter (Signed)
Patient did not show for her initial Heart Failure Clinic appointment on 01/15/22. Will attempt to reschedule.

## 2022-02-05 ENCOUNTER — Encounter: Payer: Self-pay | Admitting: *Deleted

## 2022-02-05 ENCOUNTER — Encounter: Payer: Medicaid Other | Attending: Internal Medicine | Admitting: *Deleted

## 2022-02-05 DIAGNOSIS — Z952 Presence of prosthetic heart valve: Secondary | ICD-10-CM | POA: Insufficient documentation

## 2022-02-05 NOTE — Progress Notes (Signed)
Initial phone call completed. Diagnosis can be found in [facility] 9/11. EP Orientation scheduled for Monday 10/23 at 1:30.

## 2022-02-16 ENCOUNTER — Telehealth: Payer: Self-pay

## 2022-02-16 ENCOUNTER — Ambulatory Visit: Payer: Medicaid Other

## 2022-02-16 NOTE — Telephone Encounter (Signed)
Nancy [name] called to cancel her EP orientation today as she is going to see the MD regarding possible infection and issues with her surgical site. Told her we would need clearance to reschedule her and to call us back to see what MD said.

## 2022-02-24 VITALS — Ht 64.25 in | Wt 189.8 lb

## 2022-02-24 DIAGNOSIS — Z952 Presence of prosthetic heart valve: Secondary | ICD-10-CM

## 2022-02-24 NOTE — Progress Notes (Signed)
Cardiac Individual Treatment Plan  Patient Details  Name: Nancy [name] MRN: [id number] Date of Birth: [birth year] Referring Provider:   Flowsheet Row Cardiac Rehab from 02/24/2022 in [facility] Cardiac and Pulmonary Rehab  Referring Provider Serafina Royals MD       Initial Encounter Date:  Flowsheet Row Cardiac Rehab from 02/24/2022 in [facility] Cardiac and Pulmonary Rehab  Date 02/24/22       Visit Diagnosis: S/P AVR (aortic valve replacement)  Patient's Home Medications on Admission:  Current Outpatient Medications:    acetaminophen-codeine (TYLENOL #3) 300-30 MG tablet, Take 2 tablets by mouth at bedtime., Disp: , Rfl: 0   albuterol (PROVENTIL HFA;VENTOLIN HFA) 108 (90 Base) MCG/ACT inhaler, Inhale 2 puffs into the lungs every 6 (six) hours as needed for wheezing or shortness of breath., Disp: , Rfl:    amLODipine (NORVASC) 10 MG tablet, Take 10 mg by mouth every morning. , Disp: , Rfl: 5   aspirin EC 81 MG tablet, Take 81 mg by mouth daily., Disp: , Rfl:    CINNAMON PO, Take 2 capsules by mouth 2 (two) times daily., Disp: , Rfl:    diclofenac sodium (VOLTAREN) 1 % GEL, Apply 1 application topically daily as needed (for pain)., Disp: , Rfl:    furosemide (LASIX) 40 MG tablet, Take 1 tablet (40 mg total) by mouth daily., Disp: 30 tablet, Rfl: 11   gabapentin (NEURONTIN) 300 MG capsule, Take 300 mg by mouth at bedtime., Disp: , Rfl: 3   gabapentin (NEURONTIN) 300 MG capsule, Take 1 capsule (300 mg total) by mouth 3 (three) times daily for 21 days., Disp: 63 capsule, Rfl: 0   GARCINIA [place]BO[place]IA-CHROMIUM PO, Take 2 capsules by mouth daily., Disp: , Rfl:    JARDIANCE 25 MG TABS tablet, Take 25 mg by mouth daily., Disp: , Rfl:    LANTUS SOLOSTAR 100 UNIT/ML Solostar Pen, Inject 38 Units into the skin at bedtime., Disp: , Rfl: 3   losartan (COZAAR) 100 MG tablet, Take 100 mg by mouth every morning. , Disp: , Rfl: 5   metoprolol succinate (TOPROL-XL) 50 MG 24 hr tablet, Take 50 mg by mouth  every morning. , Disp: , Rfl: 2   montelukast (SINGULAIR) 10 MG tablet, Take 10 mg by mouth daily., Disp: , Rfl:    nystatin cream (MYCOSTATIN), Apply topically., Disp: , Rfl:    OZEMPIC, 2 MG/DOSE, 8 MG/3ML SOPN, Inject into the skin., Disp: , Rfl:    Probiotic Product (PROBIOTIC PO), Take 1 capsule by mouth daily., Disp: , Rfl:    SILVADENE 1 % cream, SMARTSIG:sparingly Topical Twice Daily PRN, Disp: , Rfl:    SYMBICORT 160-4.5 MCG/ACT inhaler, Inhale 2 puffs into the lungs 2 (two) times daily as needed., Disp: , Rfl: 5   traZODone (DESYREL) 50 MG tablet, Take 0.5 tablets (25 mg total) by mouth at bedtime as needed for sleep., Disp: 30 tablet, Rfl: 0   TURMERIC PO, Take 1 capsule by mouth daily., Disp: , Rfl:   Past Medical History: Past Medical History:  Diagnosis Date   Anxiety    Arthritis    Asthma    Depression    Diabetes mellitus without complication (HCC)    GERD (gastroesophageal reflux disease)    no meds   Heart murmur    History of kidney stones    h/o   Hypertension    Myocardial infarction ([place]) 2015   Pneumonia 2014   Seizures ([place])    as a child-no since age 26  Severe aortic stenosis     Tobacco Use: Social History   Tobacco Use  Smoking Status Former   Packs/day: 0.25   Years: 13.00   Total pack years: 3.25   Types: Cigarettes  Smokeless Tobacco Never  Tobacco Comments   age [age]    Labs: Review Flowsheet       Latest Ref Rng & Units 01/03/2022  Labs for ITP Cardiac and Pulmonary Rehab  Hemoglobin A1c 4.8 - 5.6 % 8.4      Exercise Target Goals: Exercise Program Goal: Individual exercise prescription set using results from initial 6 min walk test and THRR while considering  patient's activity barriers and safety.   Exercise Prescription Goal: Initial exercise prescription builds to 30-45 minutes a day of aerobic activity, 2-3 days per week.  Home exercise guidelines will be given to patient during program as part of exercise prescription  that the participant will acknowledge.   Education: Aerobic Exercise: - Group verbal and visual presentation on the components of exercise prescription. Introduces F.I.T.T principle from ACSM for exercise prescriptions.  Reviews F.I.T.T. principles of aerobic exercise including progression. Written material given at graduation. Flowsheet Row Cardiac Rehab from 02/24/2022 in [facility] Cardiac and Pulmonary Rehab  Education need identified 02/24/22       Education: Resistance Exercise: - Group verbal and visual presentation on the components of exercise prescription. Introduces F.I.T.T principle from ACSM for exercise prescriptions  Reviews F.I.T.T. principles of resistance exercise including progression. Written material given at graduation.    Education: Exercise & Equipment Safety: - Individual verbal instruction and demonstration of equipment use and safety with use of the equipment. Flowsheet Row Cardiac Rehab from 02/24/2022 in [facility] Cardiac and Pulmonary Rehab  Date 02/24/22  Educator NT  Instruction Review Code 1- Verbalizes Understanding       Education: Exercise Physiology & General Exercise Guidelines: - Group verbal and written instruction with models to review the exercise physiology of the cardiovascular system and associated critical values. Provides general exercise guidelines with specific guidelines to those with heart or lung disease.    Education: Flexibility, Balance, Mind/Body Relaxation: - Group verbal and visual presentation with interactive activity on the components of exercise prescription. Introduces F.I.T.T principle from ACSM for exercise prescriptions. Reviews F.I.T.T. principles of flexibility and balance exercise training including progression. Also discusses the mind body connection.  Reviews various relaxation techniques to help reduce and manage stress (i.e. Deep breathing, progressive muscle relaxation, and visualization). Balance handout provided to take  home. Written material given at graduation.   Activity Barriers & Risk Stratification:  Activity Barriers & Cardiac Risk Stratification - 02/24/22 1008       Activity Barriers & Cardiac Risk Stratification   Activity Barriers Arthritis;Other (comment);Joint Problems    Comments right foot- broken toe, Hip Pain    Cardiac Risk Stratification Moderate             6 Minute Walk:  6 Minute Walk     Row Name 02/24/22 1006         6 Minute Walk   Phase Initial     Distance 1085 feet     Walk Time 6 minutes     # of Rest Breaks 0     MPH 2.05     METS 2.82     RPE 7     Perceived Dyspnea  0     VO2 Peak 9.87     Symptoms Yes (comment)     Comments Hip Pain 8/10  Resting HR 90 bpm     Resting BP 142/78     Resting Oxygen Saturation  97 %     Exercise Oxygen Saturation  during 6 min walk 95 %     Max Ex. HR 107 bpm     Max Ex. BP 144/84     2 Minute Post BP 140/78              Oxygen Initial Assessment:   Oxygen Re-Evaluation:   Oxygen Discharge (Final Oxygen Re-Evaluation):   Initial Exercise Prescription:  Initial Exercise Prescription - 02/24/22 1000       Date of Initial Exercise RX and Referring Provider   Date 02/24/22    Referring Provider Serafina Royals MD      Oxygen   Maintain Oxygen Saturation 88% or higher      Treadmill   MPH 1.8    Grade 0.5    Minutes 15    METs 2.5      NuStep   Level 2    SPM 80    Minutes 15    METs 2.82      Recumbant Elliptical   Level 1    RPM 50    Watts 23    Minutes 15    METs 2.82      T5 Nustep   Level 1    SPM 80    Minutes 15    METs 2.82      Prescription Details   Frequency (times per week) 2    Duration Progress to 30 minutes of continuous aerobic without signs/symptoms of physical distress      Intensity   THRR 40-80% of Max Heartrate 117-144    Ratings of Perceived Exertion 11-13    Perceived Dyspnea 0-4      Progression   Progression Continue to progress workloads to  maintain intensity without signs/symptoms of physical distress.      Resistance Training   Training Prescription Yes    Weight 3 lb    Reps 10-15             Perform Capillary Blood Glucose checks as needed.  Exercise Prescription Changes:   Exercise Prescription Changes     Row Name 02/24/22 1000             Response to Exercise   Blood Pressure (Admit) 142/78       Blood Pressure (Exercise) 144/84       Blood Pressure (Exit) 140/78       Heart Rate (Admit) 90 bpm       Heart Rate (Exercise) 107 bpm       Heart Rate (Exit) 95 bpm       Oxygen Saturation (Admit) 97 %       Oxygen Saturation (Exercise) 95 %       Rating of Perceived Exertion (Exercise) 7       Perceived Dyspnea (Exercise) 0       Symptoms Hip Pain 8/10       Comments 6MWT Results                Exercise Comments:   Exercise Goals and Review:   Exercise Goals     Row Name 02/24/22 1023             Exercise Goals   Increase Physical Activity Yes       Intervention Provide advice, education, support and counseling about physical activity/exercise needs.;Develop an individualized exercise prescription for  aerobic and resistive training based on initial evaluation findings, risk stratification, comorbidities and participant's personal goals.       Expected Outcomes Short Term: Attend rehab on a regular basis to increase amount of physical activity.;Long Term: Add in home exercise to make exercise part of routine and to increase amount of physical activity.;Long Term: Exercising regularly at least 3-5 days a week.       Increase Strength and Stamina Yes       Intervention Provide advice, education, support and counseling about physical activity/exercise needs.;Develop an individualized exercise prescription for aerobic and resistive training based on initial evaluation findings, risk stratification, comorbidities and participant's personal goals.       Expected Outcomes Short Term: Increase  workloads from initial exercise prescription for resistance, speed, and METs.;Short Term: Perform resistance training exercises routinely during rehab and add in resistance training at home;Long Term: Improve cardiorespiratory fitness, muscular endurance and strength as measured by increased METs and functional capacity ( )       Able to understand and use rate of perceived exertion (RPE) scale Yes       Intervention Provide education and explanation on how to use RPE scale       Expected Outcomes Short Term: Able to use RPE daily in rehab to express subjective intensity level;Long Term:  Able to use RPE to guide intensity level when exercising independently       Able to understand and use Dyspnea scale Yes       Intervention Provide education and explanation on how to use Dyspnea scale       Expected Outcomes Short Term: Able to use Dyspnea scale daily in rehab to express subjective sense of shortness of breath during exertion;Long Term: Able to use Dyspnea scale to guide intensity level when exercising independently       Knowledge and understanding of Target Heart Rate Range (THRR) Yes       Intervention Provide education and explanation of THRR including how the numbers were predicted and where they are located for reference       Expected Outcomes Short Term: Able to state/look up THRR;Long Term: Able to use THRR to govern intensity when exercising independently;Short Term: Able to use daily as guideline for intensity in rehab       Able to check pulse independently Yes       Intervention Provide education and demonstration on how to check pulse in carotid and radial arteries.;Review the importance of being able to check your own pulse for safety during independent exercise       Expected Outcomes Short Term: Able to explain why pulse checking is important during independent exercise;Long Term: Able to check pulse independently and accurately       Understanding of Exercise Prescription Yes        Intervention Provide education, explanation, and written materials on patient's individual exercise prescription       Expected Outcomes Short Term: Able to explain program exercise prescription;Long Term: Able to explain home exercise prescription to exercise independently                Exercise Goals Re-Evaluation :  Exercise Goals Re-Evaluation     Row Name 02/24/22 1025             Exercise Goal Re-Evaluation   Exercise Goals Review --                Discharge Exercise Prescription (Final Exercise Prescription Changes):  Exercise Prescription Changes -  02/24/22 1000       Response to Exercise   Blood Pressure (Admit) 142/78    Blood Pressure (Exercise) 144/84    Blood Pressure (Exit) 140/78    Heart Rate (Admit) 90 bpm    Heart Rate (Exercise) 107 bpm    Heart Rate (Exit) 95 bpm    Oxygen Saturation (Admit) 97 %    Oxygen Saturation (Exercise) 95 %    Rating of Perceived Exertion (Exercise) 7    Perceived Dyspnea (Exercise) 0    Symptoms Hip Pain 8/10    Comments 6MWT Results             Nutrition:  Target Goals: Understanding of nutrition guidelines, daily intake of sodium 1500mg , cholesterol 200mg , calories 30% from fat and 7% or less from saturated fats, daily to have 5 or more servings of fruits and vegetables.  Education: All About Nutrition: -Group instruction provided by verbal, written material, interactive activities, discussions, models, and posters to present general guidelines for heart healthy nutrition including fat, fiber, MyPlate, the role of sodium in heart healthy nutrition, utilization of the nutrition label, and utilization of this knowledge for meal planning. Follow up email sent as well. Written material given at graduation. Flowsheet Row Cardiac Rehab from 02/24/2022 in [facility] Cardiac and Pulmonary Rehab  Education need identified 02/24/22       Biometrics:  Pre Biometrics - 02/24/22 1026       Pre Biometrics   Height  5' 4.25" (1.632 m)    Weight 189 lb 12.8 oz (86.1 kg)    Waist Circumference 45 inches    Hip Circumference 45.5 inches    Waist to Hip Ratio 0.99 %    BMI (Calculated) 32.32    Single Leg Stand 14.6 seconds              Nutrition Therapy Plan and Nutrition Goals:  Nutrition Therapy & Goals - 02/24/22 0958       Nutrition Therapy   Diet Heart healthy, low Na, T2DM MNT    Drug/Food Interactions Coumadin/Vit K    Protein (specify units) 75-85g    Fiber 28 grams    Whole Grain Foods 3 servings    Saturated Fats 15 max. grams    Fruits and Vegetables 8 servings/day    Sodium 2 grams      Personal Nutrition Goals   Nutrition Goal ST: switch to whole wheat bread, include fruits/vegetables at most meals using MyPlate as a guide, eat small and frequent meals with consistent CHO, eat consistent amounts of vitamin K daily LT: limit saturated fat <15g, achieve and maintain A1C <7, eat at least 28g of fiber per day, manage INR within recommended range by MD    Comments 61 y.o. F admitted to cardiac rehab s/p AVR. PMHx inlcudes diverticulosis, T2DM, former smoker, HTN, HLD, MI, CHF. Relevant medications includes coumadin, lantus, probiotic, ozempic. Recent A1C 01/05/22: 8.5. Chaney reports having a green house and garden, she reports they eat of a lot of fish as they go fishing often. Food recall includes: B: grits or oatmeal with eggs and bacon L: pimento cheese sandwich (honey-wheat) D: pizza, stirfry. Fish, chicken (baked) with skin (air frier), shrimp - olive oil, canola oil. or cereal (total).  S: ice cream (1 black cherry cone). Discussed T2DM MNT, ozempic meal planning (smaller more frequent meals recommended), heart healthy eating, and warfarin MNT.      Intervention Plan   Intervention Prescribe, educate and counsel regarding individualized specific dietary  modifications aiming towards targeted core components such as weight, hypertension, lipid management, diabetes, heart failure and  other comorbidities.;Nutrition handout(s) given to patient.    Expected Outcomes Short Term Goal: Understand basic principles of dietary content, such as calories, fat, sodium, cholesterol and nutrients.;Short Term Goal: A plan has been developed with personal nutrition goals set during dietitian appointment.;Long Term Goal: Adherence to prescribed nutrition plan.             Nutrition Assessments:  MEDIFICTS Score Key: ?70 Need to make dietary changes  40-70 Heart Healthy Diet ? 40 Therapeutic Level Cholesterol Diet  Flowsheet Row Cardiac Rehab from 02/24/2022 in [facility] Cardiac and Pulmonary Rehab  Picture Your Plate Total Score on Admission 52      Picture Your Plate Scores: <76 Unhealthy dietary pattern with much room for improvement. 41-50 Dietary pattern unlikely to meet recommendations for good health and room for improvement. 51-60 More healthful dietary pattern, with some room for improvement.  >60 Healthy dietary pattern, although there may be some specific behaviors that could be improved.    Nutrition Goals Re-Evaluation:   Nutrition Goals Discharge (Final Nutrition Goals Re-Evaluation):   Psychosocial: Target Goals: Acknowledge presence or absence of significant depression and/or stress, maximize coping skills, provide positive support system. Participant is able to verbalize types and ability to use techniques and skills needed for reducing stress and depression.   Education: Stress, Anxiety, and Depression - Group verbal and visual presentation to define topics covered.  Reviews how body is impacted by stress, anxiety, and depression.  Also discusses healthy ways to reduce stress and to treat/manage anxiety and depression.  Written material given at graduation.   Education: Sleep Hygiene -Provides group verbal and written instruction about how sleep can affect your health.  Define sleep hygiene, discuss sleep cycles and impact of sleep habits. Review good sleep  hygiene tips.    Initial Review & Psychosocial Screening:  Initial Psych Review & Screening - 02/05/22 1443       Initial Review   Current issues with None Identified      Family Dynamics   Good Support System? Yes   husband, sisters     Barriers   Psychosocial barriers to participate in program There are no identifiable barriers or psychosocial needs.      Screening Interventions   Interventions Encouraged to exercise;Provide feedback about the scores to participant;To provide support and resources with identified psychosocial needs    Expected Outcomes Short Term goal: Utilizing psychosocial counselor, staff and physician to assist with identification of specific Stressors or current issues interfering with healing process. Setting desired goal for each stressor or current issue identified.;Long Term Goal: Stressors or current issues are controlled or eliminated.;Short Term goal: Identification and review with participant of any Quality of Life or Depression concerns found by scoring the questionnaire.;Long Term goal: The participant improves quality of Life and PHQ9 Scores as seen by post scores and/or verbalization of changes             Quality of Life Scores:   Quality of Life - 02/24/22 1005       Quality of Life   Select Quality of Life      Quality of Life Scores   Health/Function Pre 14.36 %    Socioeconomic Pre 15.25 %    Psych/Spiritual Pre 15 %    Family Pre 18 %    GLOBAL Pre 15.24 %            Scores  of 19 and below usually indicate a poorer quality of life in these areas.  A difference of  2-3 points is a clinically meaningful difference.  A difference of 2-3 points in the total score of the Quality of Life Index has been associated with significant improvement in overall quality of life, self-image, physical symptoms, and general health in studies assessing change in quality of life.  PHQ-9: Review Flowsheet       02/24/2022  Depression screen PHQ  2/9  Decreased Interest 1  Down, Depressed, Hopeless 1  PHQ - 2 Score 2  Altered sleeping 2  Tired, decreased energy 1  Change in appetite 1  Feeling bad or failure about yourself  2  Trouble concentrating 1  Moving slowly or fidgety/restless 3  Suicidal thoughts 0  PHQ-9 Score 12  Difficult doing work/chores Somewhat difficult   Interpretation of Total Score  Total Score Depression Severity:  1-4 = Minimal depression, 5-9 = Mild depression, 10-14 = Moderate depression, 15-19 = Moderately severe depression, 20-27 = Severe depression   Psychosocial Evaluation and Intervention:  Psychosocial Evaluation - 02/05/22 1449       Psychosocial Evaluation & Interventions   Interventions Encouraged to exercise with the program and follow exercise prescription;Stress management education;Relaxation education    Comments Reyes is coming to cardiac rehab after an AVR. She reports that there was quite a bit of pain, but it is easing off now. Her IV did infiltrate in the hospital and her arm is still sore. Her sleep habits are returning to normal. She does not express any stress concerns at this time, besided just the healing process. Her husband and sisters are her main support and he plans on coming with her to education. She wants to come to the program to learn more of what she should be doing to help her health.    Expected Outcomes Short: attend cardiac rehab for education and exericse. Long: develop and maintain positive self care habits.    Continue Psychosocial Services  Follow up required by staff             Psychosocial Re-Evaluation:   Psychosocial Discharge (Final Psychosocial Re-Evaluation):   Vocational Rehabilitation: Provide vocational rehab assistance to qualifying candidates.   Vocational Rehab Evaluation & Intervention:  Vocational Rehab - 02/05/22 1443       Initial Vocational Rehab Evaluation & Intervention   Assessment shows need for Vocational Rehabilitation  No             Education: Education Goals: Education classes will be provided on a variety of topics geared toward better understanding of heart health and risk factor modification. Participant will state understanding/return demonstration of topics presented as noted by education test scores.  Learning Barriers/Preferences:  Learning Barriers/Preferences - 02/05/22 1443       Learning Barriers/Preferences   Learning Barriers None    Learning Preferences Individual Instruction             General Cardiac Education Topics:  AED/CPR: - Group verbal and written instruction with the use of models to demonstrate the basic use of the AED with the basic ABC's of resuscitation.   Anatomy and Cardiac Procedures: - Group verbal and visual presentation and models provide information about basic cardiac anatomy and function. Reviews the testing methods done to diagnose heart disease and the outcomes of the test results. Describes the treatment choices: Medical Management, Angioplasty, or Coronary Bypass Surgery for treating various heart conditions including Myocardial Infarction, Angina, Valve Disease,  and Cardiac Arrhythmias.  Written material given at graduation. Flowsheet Row Cardiac Rehab from 02/24/2022 in [facility] Cardiac and Pulmonary Rehab  Education need identified 02/24/22       Medication Safety: - Group verbal and visual instruction to review commonly prescribed medications for heart and lung disease. Reviews the medication, class of the drug, and side effects. Includes the steps to properly store meds and maintain the prescription regimen.  Written material given at graduation.   Intimacy: - Group verbal instruction through game format to discuss how heart and lung disease can affect sexual intimacy. Written material given at graduation..   Know Your Numbers and Heart Failure: - Group verbal and visual instruction to discuss disease risk factors for cardiac and pulmonary  disease and treatment options.  Reviews associated critical values for Overweight/Obesity, Hypertension, Cholesterol, and Diabetes.  Discusses basics of heart failure: signs/symptoms and treatments.  Introduces Heart Failure Zone chart for action plan for heart failure.  Written material given at graduation.   Infection Prevention: - Provides verbal and written material to individual with discussion of infection control including proper hand washing and proper equipment cleaning during exercise session. Flowsheet Row Cardiac Rehab from 02/24/2022 in [facility] Cardiac and Pulmonary Rehab  Date 02/24/22  Educator NT  Instruction Review Code 1- Verbalizes Understanding       Falls Prevention: - Provides verbal and written material to individual with discussion of falls prevention and safety. Flowsheet Row Cardiac Rehab from 02/24/2022 in [facility] Cardiac and Pulmonary Rehab  Date 02/24/22  Educator NT  Instruction Review Code 1- Verbalizes Understanding       Other: -Provides group and verbal instruction on various topics (see comments)   Knowledge Questionnaire Score:  Knowledge Questionnaire Score - 02/24/22 0948       Knowledge Questionnaire Score   Pre Score 22/26             Core Components/Risk Factors/Patient Goals at Admission:  Personal Goals and Risk Factors at Admission - 02/24/22 0953       Core Components/Risk Factors/Patient Goals on Admission    Weight Management Yes;Weight Loss    Intervention Weight Management: Develop a combined nutrition and exercise program designed to reach desired caloric intake, while maintaining appropriate intake of nutrient and fiber, sodium and fats, and appropriate energy expenditure required for the weight goal.;Weight Management: Provide education and appropriate resources to help participant work on and attain dietary goals.    Admit Weight 189 lb 12.8 oz (86.1 kg)    Goal Weight: Short Term 184 lb (83.5 kg)    Goal Weight: Long  Term 179 lb (81.2 kg)    Expected Outcomes Short Term: Continue to assess and modify interventions until short term weight is achieved;Long Term: Adherence to nutrition and physical activity/exercise program aimed toward attainment of established weight goal;Weight Loss: Understanding of general recommendations for a balanced deficit meal plan, which promotes 1-2 lb weight loss per week and includes a negative energy balance of [phone] kcal/d;Understanding recommendations for meals to include 15-35% energy as protein, 25-35% energy from fat, 35-60% energy from carbohydrates, less than 200mg  of dietary cholesterol, 20-35 gm of total fiber daily;Understanding of distribution of calorie intake throughout the day with the consumption of 4-5 meals/snacks;Weight Gain: Understanding of general recommendations for a high calorie, high protein meal plan that promotes weight gain by distributing calorie intake throughout the day with the consumption for 4-5 meals, snacks, and/or supplements    Diabetes Yes    Intervention Provide education about  signs/symptoms and action to take for hypo/hyperglycemia.;Provide education about proper nutrition, including hydration, and aerobic/resistive exercise prescription along with prescribed medications to achieve blood glucose in normal ranges: Fasting glucose 65-99 mg/dL    Expected Outcomes Short Term: Participant verbalizes understanding of the signs/symptoms and immediate care of hyper/hypoglycemia, proper foot care and importance of medication, aerobic/resistive exercise and nutrition plan for blood glucose control.;Long Term: Attainment of HbA1C < 7%.    Hypertension Yes    Intervention Provide education on lifestyle modifcations including regular physical activity/exercise, weight management, moderate sodium restriction and increased consumption of fresh fruit, vegetables, and low fat dairy, alcohol moderation, and smoking cessation.;Monitor prescription use compliance.     Expected Outcomes Short Term: Continued assessment and intervention until BP is < 140/71mm HG in hypertensive participants. < 130/64mm HG in hypertensive participants with diabetes, heart failure or chronic kidney disease.;Long Term: Maintenance of blood pressure at goal levels.    Lipids Yes    Intervention Provide education and support for participant on nutrition & aerobic/resistive exercise along with prescribed medications to achieve LDL 70mg , HDL >40mg .    Expected Outcomes Short Term: Participant states understanding of desired cholesterol values and is compliant with medications prescribed. Participant is following exercise prescription and nutrition guidelines.;Long Term: Cholesterol controlled with medications as prescribed, with individualized exercise RX and with personalized nutrition plan. Value goals: LDL < 70mg , HDL > 40 mg.             Education:Diabetes - Individual verbal and written instruction to review signs/symptoms of diabetes, desired ranges of glucose level fasting, after meals and with exercise. Acknowledge that pre and post exercise glucose checks will be done for 3 sessions at entry of program. Flowsheet Row Cardiac Rehab from 02/24/2022 in [facility]  Date 02/05/22  Educator [facility]  Instruction Review Code 1- Verbalizes Understanding       Core Components/Risk Factors/Patient Goals Review:    Core Components/Risk Factors/Patient Goals at Discharge (Final Review):    ITP Comments:  ITP Comments     Row Name 02/05/22 1453 02/24/22 0947         ITP Comments Initial phone call completed. Diagnosis can be found in [facility] 9/11. EP Orientation scheduled for Monday 10/23 at 1:30. Completed and gym orientation. Initial ITP created and sent for review to Dr. Bethann Punches, Medical Director.               Comments: Initial ITP

## 2022-02-24 NOTE — Patient Instructions (Signed)
Patient Instructions  Patient Details  Name: Nancy [name] MRN: [id number] Date of Birth: June 23, 1960 Referring Provider:  Corey Skains, MD  Below are your personal goals for exercise, nutrition, and risk factors. Our goal is to help you stay on track towards obtaining and maintaining these goals. We will be discussing your progress on these goals with you throughout the program.  Initial Exercise Prescription:  Initial Exercise Prescription - 02/24/22 1000       Date of Initial Exercise RX and Referring Provider   Date 02/24/22    Referring Provider Serafina Royals MD      Oxygen   Maintain Oxygen Saturation 88% or higher      Treadmill   MPH 1.8    Grade 0.5    Minutes 15    METs 2.5      NuStep   Level 2    SPM 80    Minutes 15    METs 2.82      Recumbant Elliptical   Level 1    RPM 50    Watts 23    Minutes 15    METs 2.82      T5 Nustep   Level 1    SPM 80    Minutes 15    METs 2.82      Prescription Details   Frequency (times per week) 2    Duration Progress to 30 minutes of continuous aerobic without signs/symptoms of physical distress      Intensity   THRR 40-80% of Max Heartrate 117-144    Ratings of Perceived Exertion 11-13    Perceived Dyspnea 0-4      Progression   Progression Continue to progress workloads to maintain intensity without signs/symptoms of physical distress.      Resistance Training   Training Prescription Yes    Weight 3 lb    Reps 10-15             Exercise Goals: Frequency: Be able to perform aerobic exercise two to three times per week in program working toward 2-5 days per week of home exercise.  Intensity: Work with a perceived exertion of 11 (fairly light) - 15 (hard) while following your exercise prescription.  We will make changes to your prescription with you as you progress through the program.   Duration: Be able to do 30 to 45 minutes of continuous aerobic exercise in addition to a 5 minute warm-up  and a 5 minute cool-down routine.   Nutrition Goals: Your personal nutrition goals will be established when you do your nutrition analysis with the dietician.  The following are general nutrition guidelines to follow: Cholesterol < 200mg /day Sodium < 1500mg /day Fiber: Women over 50 yrs - 21 grams per day  Personal Goals:  Personal Goals and Risk Factors at Admission - 02/24/22 0953       Core Components/Risk Factors/Patient Goals on Admission    Weight Management Yes;Weight Loss    Intervention Weight Management: Develop a combined nutrition and exercise program designed to reach desired caloric intake, while maintaining appropriate intake of nutrient and fiber, sodium and fats, and appropriate energy expenditure required for the weight goal.;Weight Management: Provide education and appropriate resources to help participant work on and attain dietary goals.    Admit Weight 189 lb 12.8 oz (86.1 kg)    Goal Weight: Short Term 184 lb (83.5 kg)    Goal Weight: Long Term 179 lb (81.2 kg)    Expected Outcomes Short Term: Continue to  assess and modify interventions until short term weight is achieved;Long Term: Adherence to nutrition and physical activity/exercise program aimed toward attainment of established weight goal;Weight Loss: Understanding of general recommendations for a balanced deficit meal plan, which promotes 1-2 lb weight loss per week and includes a negative energy balance of [phone] kcal/d;Understanding recommendations for meals to include 15-35% energy as protein, 25-35% energy from fat, 35-60% energy from carbohydrates, less than 200mg  of dietary cholesterol, 20-35 gm of total fiber daily;Understanding of distribution of calorie intake throughout the day with the consumption of 4-5 meals/snacks;Weight Gain: Understanding of general recommendations for a high calorie, high protein meal plan that promotes weight gain by distributing calorie intake throughout the day with the  consumption for 4-5 meals, snacks, and/or supplements    Diabetes Yes    Intervention Provide education about signs/symptoms and action to take for hypo/hyperglycemia.;Provide education about proper nutrition, including hydration, and aerobic/resistive exercise prescription along with prescribed medications to achieve blood glucose in normal ranges: Fasting glucose 65-99 mg/dL    Expected Outcomes Short Term: Participant verbalizes understanding of the signs/symptoms and immediate care of hyper/hypoglycemia, proper foot care and importance of medication, aerobic/resistive exercise and nutrition plan for blood glucose control.;Long Term: Attainment of HbA1C < 7%.    Hypertension Yes    Intervention Provide education on lifestyle modifcations including regular physical activity/exercise, weight management, moderate sodium restriction and increased consumption of fresh fruit, vegetables, and low fat dairy, alcohol moderation, and smoking cessation.;Monitor prescription use compliance.    Expected Outcomes Short Term: Continued assessment and intervention until BP is < 140/63mm HG in hypertensive participants. < 130/24mm HG in hypertensive participants with diabetes, heart failure or chronic kidney disease.;Long Term: Maintenance of blood pressure at goal levels.    Lipids Yes    Intervention Provide education and support for participant on nutrition & aerobic/resistive exercise along with prescribed medications to achieve LDL 70mg , HDL >40mg .    Expected Outcomes Short Term: Participant states understanding of desired cholesterol values and is compliant with medications prescribed. Participant is following exercise prescription and nutrition guidelines.;Long Term: Cholesterol controlled with medications as prescribed, with individualized exercise RX and with personalized nutrition plan. Value goals: LDL < 70mg , HDL > 40 mg.             Tobacco Use Initial Evaluation: Social History   Tobacco Use   Smoking Status Former   Packs/day: 0.25   Years: 13.00   Total pack years: 3.25   Types: Cigarettes  Smokeless Tobacco Never  Tobacco Comments   age [age]    Exercise Goals and Review:  Exercise Goals     Row Name 02/24/22 1023             Exercise Goals   Increase Physical Activity Yes       Intervention Provide advice, education, support and counseling about physical activity/exercise needs.;Develop an individualized exercise prescription for aerobic and resistive training based on initial evaluation findings, risk stratification, comorbidities and participant's personal goals.       Expected Outcomes Short Term: Attend rehab on a regular basis to increase amount of physical activity.;Long Term: Add in home exercise to make exercise part of routine and to increase amount of physical activity.;Long Term: Exercising regularly at least 3-5 days a week.       Increase Strength and Stamina Yes       Intervention Provide advice, education, support and counseling about physical activity/exercise needs.;Develop an individualized exercise prescription for aerobic and resistive training based  on initial evaluation findings, risk stratification, comorbidities and participant's personal goals.       Expected Outcomes Short Term: Increase workloads from initial exercise prescription for resistance, speed, and METs.;Short Term: Perform resistance training exercises routinely during rehab and add in resistance training at home;Long Term: Improve cardiorespiratory fitness, muscular endurance and strength as measured by increased METs and functional capacity ( )       Able to understand and use rate of perceived exertion (RPE) scale Yes       Intervention Provide education and explanation on how to use RPE scale       Expected Outcomes Short Term: Able to use RPE daily in rehab to express subjective intensity level;Long Term:  Able to use RPE to guide intensity level when exercising independently        Able to understand and use Dyspnea scale Yes       Intervention Provide education and explanation on how to use Dyspnea scale       Expected Outcomes Short Term: Able to use Dyspnea scale daily in rehab to express subjective sense of shortness of breath during exertion;Long Term: Able to use Dyspnea scale to guide intensity level when exercising independently       Knowledge and understanding of Target Heart Rate Range (THRR) Yes       Intervention Provide education and explanation of THRR including how the numbers were predicted and where they are located for reference       Expected Outcomes Short Term: Able to state/look up THRR;Long Term: Able to use THRR to govern intensity when exercising independently;Short Term: Able to use daily as guideline for intensity in rehab       Able to check pulse independently Yes       Intervention Provide education and demonstration on how to check pulse in carotid and radial arteries.;Review the importance of being able to check your own pulse for safety during independent exercise       Expected Outcomes Short Term: Able to explain why pulse checking is important during independent exercise;Long Term: Able to check pulse independently and accurately       Understanding of Exercise Prescription Yes       Intervention Provide education, explanation, and written materials on patient's individual exercise prescription       Expected Outcomes Short Term: Able to explain program exercise prescription;Long Term: Able to explain home exercise prescription to exercise independently

## 2022-02-25 ENCOUNTER — Encounter: Payer: Self-pay | Admitting: *Deleted

## 2022-02-25 DIAGNOSIS — Z952 Presence of prosthetic heart valve: Secondary | ICD-10-CM

## 2022-02-25 NOTE — Progress Notes (Signed)
Cardiac Individual Treatment Plan  Patient Details  Name: Nancy [name] MRN: [id number] Date of Birth: [birth year] Referring Provider:   Flowsheet Row Cardiac Rehab from 02/24/2022 in [facility] Cardiac and Pulmonary Rehab  Referring Provider Serafina Royals MD       Initial Encounter Date:  Flowsheet Row Cardiac Rehab from 02/24/2022 in [facility] Cardiac and Pulmonary Rehab  Date 02/24/22       Visit Diagnosis: S/P AVR (aortic valve replacement)  Patient's Home Medications on Admission:  Current Outpatient Medications:    acetaminophen-codeine (TYLENOL #3) 300-30 MG tablet, Take 2 tablets by mouth at bedtime., Disp: , Rfl: 0   albuterol (PROVENTIL HFA;VENTOLIN HFA) 108 (90 Base) MCG/ACT inhaler, Inhale 2 puffs into the lungs every 6 (six) hours as needed for wheezing or shortness of breath., Disp: , Rfl:    amLODipine (NORVASC) 10 MG tablet, Take 10 mg by mouth every morning. , Disp: , Rfl: 5   aspirin EC 81 MG tablet, Take 81 mg by mouth daily., Disp: , Rfl:    CINNAMON PO, Take 2 capsules by mouth 2 (two) times daily., Disp: , Rfl:    diclofenac sodium (VOLTAREN) 1 % GEL, Apply 1 application topically daily as needed (for pain)., Disp: , Rfl:    furosemide (LASIX) 40 MG tablet, Take 1 tablet (40 mg total) by mouth daily., Disp: 30 tablet, Rfl: 11   gabapentin (NEURONTIN) 300 MG capsule, Take 300 mg by mouth at bedtime., Disp: , Rfl: 3   gabapentin (NEURONTIN) 300 MG capsule, Take 1 capsule (300 mg total) by mouth 3 (three) times daily for 21 days., Disp: 63 capsule, Rfl: 0   GARCINIA [place]IA-CHROMIUM PO, Take 2 capsules by mouth daily., Disp: , Rfl:    JARDIANCE 25 MG TABS tablet, Take 25 mg by mouth daily., Disp: , Rfl:    LANTUS SOLOSTAR 100 UNIT/ML Solostar Pen, Inject 38 Units into the skin at bedtime., Disp: , Rfl: 3   losartan (COZAAR) 100 MG tablet, Take 100 mg by mouth every morning. , Disp: , Rfl: 5   metoprolol succinate (TOPROL-XL) 50 MG 24 hr tablet, Take 50 mg by mouth  every morning. , Disp: , Rfl: 2   montelukast (SINGULAIR) 10 MG tablet, Take 10 mg by mouth daily., Disp: , Rfl:    nystatin cream (MYCOSTATIN), Apply topically., Disp: , Rfl:    OZEMPIC, 2 MG/DOSE, 8 MG/3ML SOPN, Inject into the skin., Disp: , Rfl:    Probiotic Product (PROBIOTIC PO), Take 1 capsule by mouth daily., Disp: , Rfl:    SILVADENE 1 % cream, SMARTSIG:sparingly Topical Twice Daily PRN, Disp: , Rfl:    SYMBICORT 160-4.5 MCG/ACT inhaler, Inhale 2 puffs into the lungs 2 (two) times daily as needed., Disp: , Rfl: 5   traZODone (DESYREL) 50 MG tablet, Take 0.5 tablets (25 mg total) by mouth at bedtime as needed for sleep., Disp: 30 tablet, Rfl: 0   TURMERIC PO, Take 1 capsule by mouth daily., Disp: , Rfl:   Past Medical History: Past Medical History:  Diagnosis Date   Anxiety    Arthritis    Asthma    Depression    Diabetes mellitus without complication (HCC)    GERD (gastroesophageal reflux disease)    no meds   Heart murmur    History of kidney stones    h/o   Hypertension    Myocardial infarction ([place]) 2015   Pneumonia 2014   Seizures ([place])    as a child-no since age 26  Severe aortic stenosis     Tobacco Use: Social History   Tobacco Use  Smoking Status Former   Packs/day: 0.25   Years: 13.00   Total pack years: 3.25   Types: Cigarettes  Smokeless Tobacco Never  Tobacco Comments   age [age]    Labs: Review Flowsheet       Latest Ref Rng & Units 01/03/2022  Labs for ITP Cardiac and Pulmonary Rehab  Hemoglobin A1c 4.8 - 5.6 % 8.4      Exercise Target Goals: Exercise Program Goal: Individual exercise prescription set using results from initial 6 min walk test and THRR while considering  patient's activity barriers and safety.   Exercise Prescription Goal: Initial exercise prescription builds to 30-45 minutes a day of aerobic activity, 2-3 days per week.  Home exercise guidelines will be given to patient during program as part of exercise prescription  that the participant will acknowledge.   Education: Aerobic Exercise: - Group verbal and visual presentation on the components of exercise prescription. Introduces F.I.T.T principle from ACSM for exercise prescriptions.  Reviews F.I.T.T. principles of aerobic exercise including progression. Written material given at graduation. Flowsheet Row Cardiac Rehab from 02/24/2022 in [facility] Cardiac and Pulmonary Rehab  Education need identified 02/24/22       Education: Resistance Exercise: - Group verbal and visual presentation on the components of exercise prescription. Introduces F.I.T.T principle from ACSM for exercise prescriptions  Reviews F.I.T.T. principles of resistance exercise including progression. Written material given at graduation.    Education: Exercise & Equipment Safety: - Individual verbal instruction and demonstration of equipment use and safety with use of the equipment. Flowsheet Row Cardiac Rehab from 02/24/2022 in [facility] Cardiac and Pulmonary Rehab  Date 02/24/22  Educator NT  Instruction Review Code 1- Verbalizes Understanding       Education: Exercise Physiology & General Exercise Guidelines: - Group verbal and written instruction with models to review the exercise physiology of the cardiovascular system and associated critical values. Provides general exercise guidelines with specific guidelines to those with heart or lung disease.    Education: Flexibility, Balance, Mind/Body Relaxation: - Group verbal and visual presentation with interactive activity on the components of exercise prescription. Introduces F.I.T.T principle from ACSM for exercise prescriptions. Reviews F.I.T.T. principles of flexibility and balance exercise training including progression. Also discusses the mind body connection.  Reviews various relaxation techniques to help reduce and manage stress (i.e. Deep breathing, progressive muscle relaxation, and visualization). Balance handout provided to take  home. Written material given at graduation.   Activity Barriers & Risk Stratification:  Activity Barriers & Cardiac Risk Stratification - 02/24/22 1008       Activity Barriers & Cardiac Risk Stratification   Activity Barriers Arthritis;Other (comment);Joint Problems    Comments right foot- broken toe, Hip Pain    Cardiac Risk Stratification Moderate             6 Minute Walk:  6 Minute Walk     Row Name 02/24/22 1006         6 Minute Walk   Phase Initial     Distance 1085 feet     Walk Time 6 minutes     # of Rest Breaks 0     MPH 2.05     METS 2.82     RPE 7     Perceived Dyspnea  0     VO2 Peak 9.87     Symptoms Yes (comment)     Comments Hip Pain 8/10  Resting HR 90 bpm     Resting BP 142/78     Resting Oxygen Saturation  97 %     Exercise Oxygen Saturation  during 6 min walk 95 %     Max Ex. HR 107 bpm     Max Ex. BP 144/84     2 Minute Post BP 140/78              Oxygen Initial Assessment:   Oxygen Re-Evaluation:   Oxygen Discharge (Final Oxygen Re-Evaluation):   Initial Exercise Prescription:  Initial Exercise Prescription - 02/24/22 1000       Date of Initial Exercise RX and Referring Provider   Date 02/24/22    Referring Provider Serafina Royals MD      Oxygen   Maintain Oxygen Saturation 88% or higher      Treadmill   MPH 1.8    Grade 0.5    Minutes 15    METs 2.5      NuStep   Level 2    SPM 80    Minutes 15    METs 2.82      Recumbant Elliptical   Level 1    RPM 50    Watts 23    Minutes 15    METs 2.82      T5 Nustep   Level 1    SPM 80    Minutes 15    METs 2.82      Prescription Details   Frequency (times per week) 2    Duration Progress to 30 minutes of continuous aerobic without signs/symptoms of physical distress      Intensity   THRR 40-80% of Max Heartrate 117-144    Ratings of Perceived Exertion 11-13    Perceived Dyspnea 0-4      Progression   Progression Continue to progress workloads to  maintain intensity without signs/symptoms of physical distress.      Resistance Training   Training Prescription Yes    Weight 3 lb    Reps 10-15             Perform Capillary Blood Glucose checks as needed.  Exercise Prescription Changes:   Exercise Prescription Changes     Row Name 02/24/22 1000             Response to Exercise   Blood Pressure (Admit) 142/78       Blood Pressure (Exercise) 144/84       Blood Pressure (Exit) 140/78       Heart Rate (Admit) 90 bpm       Heart Rate (Exercise) 107 bpm       Heart Rate (Exit) 95 bpm       Oxygen Saturation (Admit) 97 %       Oxygen Saturation (Exercise) 95 %       Rating of Perceived Exertion (Exercise) 7       Perceived Dyspnea (Exercise) 0       Symptoms Hip Pain 8/10       Comments 6MWT Results                Exercise Comments:   Exercise Goals and Review:   Exercise Goals     Row Name 02/24/22 1023             Exercise Goals   Increase Physical Activity Yes       Intervention Provide advice, education, support and counseling about physical activity/exercise needs.;Develop an individualized exercise prescription for  aerobic and resistive training based on initial evaluation findings, risk stratification, comorbidities and participant's personal goals.       Expected Outcomes Short Term: Attend rehab on a regular basis to increase amount of physical activity.;Long Term: Add in home exercise to make exercise part of routine and to increase amount of physical activity.;Long Term: Exercising regularly at least 3-5 days a week.       Increase Strength and Stamina Yes       Intervention Provide advice, education, support and counseling about physical activity/exercise needs.;Develop an individualized exercise prescription for aerobic and resistive training based on initial evaluation findings, risk stratification, comorbidities and participant's personal goals.       Expected Outcomes Short Term: Increase  workloads from initial exercise prescription for resistance, speed, and METs.;Short Term: Perform resistance training exercises routinely during rehab and add in resistance training at home;Long Term: Improve cardiorespiratory fitness, muscular endurance and strength as measured by increased METs and functional capacity ( )       Able to understand and use rate of perceived exertion (RPE) scale Yes       Intervention Provide education and explanation on how to use RPE scale       Expected Outcomes Short Term: Able to use RPE daily in rehab to express subjective intensity level;Long Term:  Able to use RPE to guide intensity level when exercising independently       Able to understand and use Dyspnea scale Yes       Intervention Provide education and explanation on how to use Dyspnea scale       Expected Outcomes Short Term: Able to use Dyspnea scale daily in rehab to express subjective sense of shortness of breath during exertion;Long Term: Able to use Dyspnea scale to guide intensity level when exercising independently       Knowledge and understanding of Target Heart Rate Range (THRR) Yes       Intervention Provide education and explanation of THRR including how the numbers were predicted and where they are located for reference       Expected Outcomes Short Term: Able to state/look up THRR;Long Term: Able to use THRR to govern intensity when exercising independently;Short Term: Able to use daily as guideline for intensity in rehab       Able to check pulse independently Yes       Intervention Provide education and demonstration on how to check pulse in carotid and radial arteries.;Review the importance of being able to check your own pulse for safety during independent exercise       Expected Outcomes Short Term: Able to explain why pulse checking is important during independent exercise;Long Term: Able to check pulse independently and accurately       Understanding of Exercise Prescription Yes        Intervention Provide education, explanation, and written materials on patient's individual exercise prescription       Expected Outcomes Short Term: Able to explain program exercise prescription;Long Term: Able to explain home exercise prescription to exercise independently                Exercise Goals Re-Evaluation :  Exercise Goals Re-Evaluation     Row Name 02/24/22 1025             Exercise Goal Re-Evaluation   Exercise Goals Review --                Discharge Exercise Prescription (Final Exercise Prescription Changes):  Exercise Prescription Changes -  02/24/22 1000       Response to Exercise   Blood Pressure (Admit) 142/78    Blood Pressure (Exercise) 144/84    Blood Pressure (Exit) 140/78    Heart Rate (Admit) 90 bpm    Heart Rate (Exercise) 107 bpm    Heart Rate (Exit) 95 bpm    Oxygen Saturation (Admit) 97 %    Oxygen Saturation (Exercise) 95 %    Rating of Perceived Exertion (Exercise) 7    Perceived Dyspnea (Exercise) 0    Symptoms Hip Pain 8/10    Comments 6MWT Results             Nutrition:  Target Goals: Understanding of nutrition guidelines, daily intake of sodium 1500mg , cholesterol 200mg , calories 30% from fat and 7% or less from saturated fats, daily to have 5 or more servings of fruits and vegetables.  Education: All About Nutrition: -Group instruction provided by verbal, written material, interactive activities, discussions, models, and posters to present general guidelines for heart healthy nutrition including fat, fiber, MyPlate, the role of sodium in heart healthy nutrition, utilization of the nutrition label, and utilization of this knowledge for meal planning. Follow up email sent as well. Written material given at graduation. Flowsheet Row Cardiac Rehab from 02/24/2022 in [facility] Cardiac and Pulmonary Rehab  Education need identified 02/24/22       Biometrics:  Pre Biometrics - 02/24/22 1026       Pre Biometrics   Height  5' 4.25" (1.632 m)    Weight 189 lb 12.8 oz (86.1 kg)    Waist Circumference 45 inches    Hip Circumference 45.5 inches    Waist to Hip Ratio 0.99 %    BMI (Calculated) 32.32    Single Leg Stand 14.6 seconds              Nutrition Therapy Plan and Nutrition Goals:  Nutrition Therapy & Goals - 02/24/22 0958       Nutrition Therapy   Diet Heart healthy, low Na, T2DM MNT    Drug/Food Interactions Coumadin/Vit K    Protein (specify units) 75-85g    Fiber 28 grams    Whole Grain Foods 3 servings    Saturated Fats 15 max. grams    Fruits and Vegetables 8 servings/day    Sodium 2 grams      Personal Nutrition Goals   Nutrition Goal ST: switch to whole wheat bread, include fruits/vegetables at most meals using MyPlate as a guide, eat small and frequent meals with consistent CHO, eat consistent amounts of vitamin K daily LT: limit saturated fat <15g, achieve and maintain A1C <7, eat at least 28g of fiber per day, manage INR within recommended range by MD    Comments 61 y.o. F admitted to cardiac rehab s/p AVR. PMHx inlcudes diverticulosis, T2DM, former smoker, HTN, HLD, MI, CHF. Relevant medications includes coumadin, lantus, probiotic, ozempic. Recent A1C 01/05/22: 8.5. Chaney reports having a green house and garden, she reports they eat of a lot of fish as they go fishing often. Food recall includes: B: grits or oatmeal with eggs and bacon L: pimento cheese sandwich (honey-wheat) D: pizza, stirfry. Fish, chicken (baked) with skin (air frier), shrimp - olive oil, canola oil. or cereal (total).  S: ice cream (1 black cherry cone). Discussed T2DM MNT, ozempic meal planning (smaller more frequent meals recommended), heart healthy eating, and warfarin MNT.      Intervention Plan   Intervention Prescribe, educate and counsel regarding individualized specific dietary  modifications aiming towards targeted core components such as weight, hypertension, lipid management, diabetes, heart failure and  other comorbidities.;Nutrition handout(s) given to patient.    Expected Outcomes Short Term Goal: Understand basic principles of dietary content, such as calories, fat, sodium, cholesterol and nutrients.;Short Term Goal: A plan has been developed with personal nutrition goals set during dietitian appointment.;Long Term Goal: Adherence to prescribed nutrition plan.             Nutrition Assessments:  MEDIFICTS Score Key: ?70 Need to make dietary changes  40-70 Heart Healthy Diet ? 40 Therapeutic Level Cholesterol Diet  Flowsheet Row Cardiac Rehab from 02/24/2022 in [facility] Cardiac and Pulmonary Rehab  Picture Your Plate Total Score on Admission 52      Picture Your Plate Scores: <76 Unhealthy dietary pattern with much room for improvement. 41-50 Dietary pattern unlikely to meet recommendations for good health and room for improvement. 51-60 More healthful dietary pattern, with some room for improvement.  >60 Healthy dietary pattern, although there may be some specific behaviors that could be improved.    Nutrition Goals Re-Evaluation:   Nutrition Goals Discharge (Final Nutrition Goals Re-Evaluation):   Psychosocial: Target Goals: Acknowledge presence or absence of significant depression and/or stress, maximize coping skills, provide positive support system. Participant is able to verbalize types and ability to use techniques and skills needed for reducing stress and depression.   Education: Stress, Anxiety, and Depression - Group verbal and visual presentation to define topics covered.  Reviews how body is impacted by stress, anxiety, and depression.  Also discusses healthy ways to reduce stress and to treat/manage anxiety and depression.  Written material given at graduation.   Education: Sleep Hygiene -Provides group verbal and written instruction about how sleep can affect your health.  Define sleep hygiene, discuss sleep cycles and impact of sleep habits. Review good sleep  hygiene tips.    Initial Review & Psychosocial Screening:  Initial Psych Review & Screening - 02/05/22 1443       Initial Review   Current issues with None Identified      Family Dynamics   Good Support System? Yes   husband, sisters     Barriers   Psychosocial barriers to participate in program There are no identifiable barriers or psychosocial needs.      Screening Interventions   Interventions Encouraged to exercise;Provide feedback about the scores to participant;To provide support and resources with identified psychosocial needs    Expected Outcomes Short Term goal: Utilizing psychosocial counselor, staff and physician to assist with identification of specific Stressors or current issues interfering with healing process. Setting desired goal for each stressor or current issue identified.;Long Term Goal: Stressors or current issues are controlled or eliminated.;Short Term goal: Identification and review with participant of any Quality of Life or Depression concerns found by scoring the questionnaire.;Long Term goal: The participant improves quality of Life and PHQ9 Scores as seen by post scores and/or verbalization of changes             Quality of Life Scores:   Quality of Life - 02/24/22 1005       Quality of Life   Select Quality of Life      Quality of Life Scores   Health/Function Pre 14.36 %    Socioeconomic Pre 15.25 %    Psych/Spiritual Pre 15 %    Family Pre 18 %    GLOBAL Pre 15.24 %            Scores  of 19 and below usually indicate a poorer quality of life in these areas.  A difference of  2-3 points is a clinically meaningful difference.  A difference of 2-3 points in the total score of the Quality of Life Index has been associated with significant improvement in overall quality of life, self-image, physical symptoms, and general health in studies assessing change in quality of life.  PHQ-9: Review Flowsheet       02/24/2022  Depression screen PHQ  2/9  Decreased Interest 1  Down, Depressed, Hopeless 1  PHQ - 2 Score 2  Altered sleeping 2  Tired, decreased energy 1  Change in appetite 1  Feeling bad or failure about yourself  2  Trouble concentrating 1  Moving slowly or fidgety/restless 3  Suicidal thoughts 0  PHQ-9 Score 12  Difficult doing work/chores Somewhat difficult   Interpretation of Total Score  Total Score Depression Severity:  1-4 = Minimal depression, 5-9 = Mild depression, 10-14 = Moderate depression, 15-19 = Moderately severe depression, 20-27 = Severe depression   Psychosocial Evaluation and Intervention:  Psychosocial Evaluation - 02/05/22 1449       Psychosocial Evaluation & Interventions   Interventions Encouraged to exercise with the program and follow exercise prescription;Stress management education;Relaxation education    Comments Caycee is coming to cardiac rehab after an AVR. She reports that there was quite a bit of pain, but it is easing off now. Her IV did infiltrate in the hospital and her arm is still sore. Her sleep habits are returning to normal. She does not express any stress concerns at this time, besided just the healing process. Her husband and sisters are her main support and he plans on coming with her to education. She wants to come to the program to learn more of what she should be doing to help her health.    Expected Outcomes Short: attend cardiac rehab for education and exericse. Long: develop and maintain positive self care habits.    Continue Psychosocial Services  Follow up required by staff             Psychosocial Re-Evaluation:   Psychosocial Discharge (Final Psychosocial Re-Evaluation):   Vocational Rehabilitation: Provide vocational rehab assistance to qualifying candidates.   Vocational Rehab Evaluation & Intervention:  Vocational Rehab - 02/05/22 1443       Initial Vocational Rehab Evaluation & Intervention   Assessment shows need for Vocational Rehabilitation  No             Education: Education Goals: Education classes will be provided on a variety of topics geared toward better understanding of heart health and risk factor modification. Participant will state understanding/return demonstration of topics presented as noted by education test scores.  Learning Barriers/Preferences:  Learning Barriers/Preferences - 02/05/22 1443       Learning Barriers/Preferences   Learning Barriers None    Learning Preferences Individual Instruction             General Cardiac Education Topics:  AED/CPR: - Group verbal and written instruction with the use of models to demonstrate the basic use of the AED with the basic ABC's of resuscitation.   Anatomy and Cardiac Procedures: - Group verbal and visual presentation and models provide information about basic cardiac anatomy and function. Reviews the testing methods done to diagnose heart disease and the outcomes of the test results. Describes the treatment choices: Medical Management, Angioplasty, or Coronary Bypass Surgery for treating various heart conditions including Myocardial Infarction, Angina, Valve Disease,  and Cardiac Arrhythmias.  Written material given at graduation. Flowsheet Row Cardiac Rehab from 02/24/2022 in [facility] Cardiac and Pulmonary Rehab  Education need identified 02/24/22       Medication Safety: - Group verbal and visual instruction to review commonly prescribed medications for heart and lung disease. Reviews the medication, class of the drug, and side effects. Includes the steps to properly store meds and maintain the prescription regimen.  Written material given at graduation.   Intimacy: - Group verbal instruction through game format to discuss how heart and lung disease can affect sexual intimacy. Written material given at graduation..   Know Your Numbers and Heart Failure: - Group verbal and visual instruction to discuss disease risk factors for cardiac and pulmonary  disease and treatment options.  Reviews associated critical values for Overweight/Obesity, Hypertension, Cholesterol, and Diabetes.  Discusses basics of heart failure: signs/symptoms and treatments.  Introduces Heart Failure Zone chart for action plan for heart failure.  Written material given at graduation.   Infection Prevention: - Provides verbal and written material to individual with discussion of infection control including proper hand washing and proper equipment cleaning during exercise session. Flowsheet Row Cardiac Rehab from 02/24/2022 in [facility] Cardiac and Pulmonary Rehab  Date 02/24/22  Educator NT  Instruction Review Code 1- Verbalizes Understanding       Falls Prevention: - Provides verbal and written material to individual with discussion of falls prevention and safety. Flowsheet Row Cardiac Rehab from 02/24/2022 in [facility] Cardiac and Pulmonary Rehab  Date 02/24/22  Educator NT  Instruction Review Code 1- Verbalizes Understanding       Other: -Provides group and verbal instruction on various topics (see comments)   Knowledge Questionnaire Score:  Knowledge Questionnaire Score - 02/24/22 0948       Knowledge Questionnaire Score   Pre Score 22/26             Core Components/Risk Factors/Patient Goals at Admission:  Personal Goals and Risk Factors at Admission - 02/24/22 0953       Core Components/Risk Factors/Patient Goals on Admission    Weight Management Yes;Weight Loss    Intervention Weight Management: Develop a combined nutrition and exercise program designed to reach desired caloric intake, while maintaining appropriate intake of nutrient and fiber, sodium and fats, and appropriate energy expenditure required for the weight goal.;Weight Management: Provide education and appropriate resources to help participant work on and attain dietary goals.    Admit Weight 189 lb 12.8 oz (86.1 kg)    Goal Weight: Short Term 184 lb (83.5 kg)    Goal Weight: Long  Term 179 lb (81.2 kg)    Expected Outcomes Short Term: Continue to assess and modify interventions until short term weight is achieved;Long Term: Adherence to nutrition and physical activity/exercise program aimed toward attainment of established weight goal;Weight Loss: Understanding of general recommendations for a balanced deficit meal plan, which promotes 1-2 lb weight loss per week and includes a negative energy balance of [id number] kcal/d;Understanding recommendations for meals to include 15-35% energy as protein, 25-35% energy from fat, 35-60% energy from carbohydrates, less than 200mg  of dietary cholesterol, 20-35 gm of total fiber daily;Understanding of distribution of calorie intake throughout the day with the consumption of 4-5 meals/snacks;Weight Gain: Understanding of general recommendations for a high calorie, high protein meal plan that promotes weight gain by distributing calorie intake throughout the day with the consumption for 4-5 meals, snacks, and/or supplements    Diabetes Yes    Intervention Provide education about  signs/symptoms and action to take for hypo/hyperglycemia.;Provide education about proper nutrition, including hydration, and aerobic/resistive exercise prescription along with prescribed medications to achieve blood glucose in normal ranges: Fasting glucose 65-99 mg/dL    Expected Outcomes Short Term: Participant verbalizes understanding of the signs/symptoms and immediate care of hyper/hypoglycemia, proper foot care and importance of medication, aerobic/resistive exercise and nutrition plan for blood glucose control.;Long Term: Attainment of HbA1C < 7%.    Hypertension Yes    Intervention Provide education on lifestyle modifcations including regular physical activity/exercise, weight management, moderate sodium restriction and increased consumption of fresh fruit, vegetables, and low fat dairy, alcohol moderation, and smoking cessation.;Monitor prescription use compliance.     Expected Outcomes Short Term: Continued assessment and intervention until BP is < 140/69mm HG in hypertensive participants. < 130/45mm HG in hypertensive participants with diabetes, heart failure or chronic kidney disease.;Long Term: Maintenance of blood pressure at goal levels.    Lipids Yes    Intervention Provide education and support for participant on nutrition & aerobic/resistive exercise along with prescribed medications to achieve LDL 70mg , HDL >40mg .    Expected Outcomes Short Term: Participant states understanding of desired cholesterol values and is compliant with medications prescribed. Participant is following exercise prescription and nutrition guidelines.;Long Term: Cholesterol controlled with medications as prescribed, with individualized exercise RX and with personalized nutrition plan. Value goals: LDL < 70mg , HDL > 40 mg.             Education:Diabetes - Individual verbal and written instruction to review signs/symptoms of diabetes, desired ranges of glucose level fasting, after meals and with exercise. Acknowledge that pre and post exercise glucose checks will be done for 3 sessions at entry of program. Flowsheet Row Cardiac Rehab from 02/24/2022 in [facility] Cardiac and Pulmonary Rehab  Date 02/05/22  Educator [facility]  Instruction Review Code 1- Verbalizes Understanding       Core Components/Risk Factors/Patient Goals Review:    Core Components/Risk Factors/Patient Goals at Discharge (Final Review):    ITP Comments:  ITP Comments     Row Name 02/05/22 1453 02/24/22 0947 02/25/22 0946       ITP Comments Initial phone call completed. Diagnosis can be found in [facility] 9/11. EP Orientation scheduled for Monday 10/23 at 1:30. Completed 11/23 and gym orientation. Initial ITP created and sent for review to Dr. , Medical Director. 30 Day review completed. Medical Director ITP review done, changes made as directed, and signed approval by Medical Director.   NEW TO PROGRAM               Comments:

## 2022-02-26 ENCOUNTER — Ambulatory Visit: Payer: Medicaid Other

## 2022-03-03 ENCOUNTER — Encounter: Payer: Medicaid Other | Attending: Internal Medicine | Admitting: *Deleted

## 2022-03-03 DIAGNOSIS — Z952 Presence of prosthetic heart valve: Secondary | ICD-10-CM

## 2022-03-03 DIAGNOSIS — I2089 Other forms of angina pectoris: Secondary | ICD-10-CM | POA: Diagnosis not present

## 2022-03-03 LAB — GLUCOSE, CAPILLARY
Glucose-Capillary: 290 mg/dL — ABNORMAL HIGH (ref 70–99)
Glucose-Capillary: 306 mg/dL — ABNORMAL HIGH (ref 70–99)
Glucose-Capillary: 224 mg/dL — ABNORMAL HIGH (ref 70–99)

## 2022-03-03 NOTE — Progress Notes (Signed)
Daily Session Note  Patient Details  Name: Nancy [name] MRN: [id number] Date of Birth: 01/22/1961 Referring Provider:   Flowsheet Row Cardiac Rehab from 02/24/2022 in [facility] Cardiac and Pulmonary Rehab  Referring Provider Serafina Royals MD       Encounter Date: 03/03/2022  Check In:  Session Check In - 03/03/22 1148       Check-In   Supervising physician immediately available to respond to emergencies See telemetry face sheet for immediately available ER MD    Location [facility]-Cardiac & Pulmonary Rehab    Staff Present Heath Lark, RN, BSN, CCRP;Jessica Struble, MA, RCEP, CCRP, Marylynn Pearson, MS, ASCM CEP, Exercise Physiologist;Noah Tickle, BS, Exercise Physiologist    Virtual Visit No    Medication changes reported     No    Fall or balance concerns reported    No    Warm-up and Cool-down Performed on first and last piece of equipment    Resistance Training Performed Yes    VAD Patient? No    PAD/SET Patient? No      Pain Assessment   Currently in Pain? No/denies                Social History   Tobacco Use  Smoking Status Former   Packs/day: 0.25   Years: 13.00   Total pack years: 3.25   Types: Cigarettes  Smokeless Tobacco Never  Tobacco Comments   age [age]    Goals Met:  Exercise tolerated well Personal goals reviewed No report of concerns or symptoms today  Goals Unmet:  Not Applicable  Comments: First full day of exercise!  Patient was oriented to gym and equipment including functions, settings, policies, and procedures.  Patient's individual exercise prescription and treatment plan were reviewed.  All starting workloads were established based on the results of the 6 minute walk test done at initial orientation visit.  The plan for exercise progression was also introduced and progression will be customized based on patient's performance and goals.    Dr. Emily Filbert is Medical Director for [place].  Dr. Ottie Glazier is Medical Director for [facility] Pulmonary Rehabilitation.

## 2022-03-05 ENCOUNTER — Encounter: Payer: Medicaid Other | Admitting: *Deleted

## 2022-03-05 DIAGNOSIS — Z952 Presence of prosthetic heart valve: Secondary | ICD-10-CM | POA: Diagnosis not present

## 2022-03-05 LAB — GLUCOSE, CAPILLARY
Glucose-Capillary: 236 mg/dL — ABNORMAL HIGH (ref 70–99)
Glucose-Capillary: 195 mg/dL — ABNORMAL HIGH (ref 70–99)

## 2022-03-05 NOTE — Progress Notes (Signed)
Daily Session Note  Patient Details  Name: Nancy [name] MRN: [id number] Date of Birth: [birth year] Referring Provider:   Flowsheet Row Cardiac Rehab from 02/24/2022 in [facility] Cardiac and Pulmonary Rehab  Referring Provider Serafina Royals MD       Encounter Date: 03/05/2022  Check In:  Session Check In - 03/05/22 1313       Check-In   Supervising physician immediately available to respond to emergencies See telemetry face sheet for immediately available ER MD    Location [facility]-Cardiac & Pulmonary Rehab    Staff Present Coralie Keens, MS, ASCM CEP, Exercise Physiologist;Meredith Sherryll Burger, RN BSN;Joseph Rosebud Poles, RN, [place]    Virtual Visit No    Medication changes reported     No    Fall or balance concerns reported    No    Warm-up and Cool-down Performed on first and last piece of equipment    Resistance Training Performed Yes    VAD Patient? No    PAD/SET Patient? No      Pain Assessment   Currently in Pain? No/denies                Social History   Tobacco Use  Smoking Status Former   Packs/day: 0.25   Years: 13.00   Total pack years: 3.25   Types: Cigarettes  Smokeless Tobacco Never  Tobacco Comments   age [age]    Goals Met:  Independence with exercise equipment Exercise tolerated well No report of concerns or symptoms today Strength training completed today  Goals Unmet:  Not Applicable  Comments: Pt able to follow exercise prescription today without complaint.  Will continue to monitor for progression.    Dr. Emily Filbert is Medical Director for [place].  Dr. Ottie Glazier is Medical Director for [facility] Pulmonary Rehabilitation.

## 2022-03-10 ENCOUNTER — Encounter: Payer: Medicaid Other | Admitting: *Deleted

## 2022-03-10 DIAGNOSIS — Z952 Presence of prosthetic heart valve: Secondary | ICD-10-CM | POA: Diagnosis not present

## 2022-03-10 LAB — GLUCOSE, CAPILLARY
Glucose-Capillary: 270 mg/dL — ABNORMAL HIGH (ref 70–99)
Glucose-Capillary: 231 mg/dL — ABNORMAL HIGH (ref 70–99)

## 2022-03-10 NOTE — Progress Notes (Signed)
Daily Session Note  Patient Details  Name: RUQAYYA VENTRESS MRN: [id number] Date of Birth: 10-Aug-1960 Referring Provider:   Flowsheet Row Cardiac Rehab from 02/24/2022 in [facility] Cardiac and Pulmonary Rehab  Referring Provider Serafina Royals MD       Encounter Date: 03/10/2022  Check In:  Session Check In - 03/10/22 1100       Check-In   Supervising physician immediately available to respond to emergencies See telemetry face sheet for immediately available ER MD    Location [facility]-Cardiac & Pulmonary Rehab    Staff Present Renita Papa, RN BSN;Jessica [address], MA, RCEP, CCRP, Marylynn Pearson, MS, ASCM CEP, Exercise Physiologist;Noah Tickle, BS, Exercise Physiologist    Virtual Visit No    Medication changes reported     No    Fall or balance concerns reported    No    Warm-up and Cool-down Performed on first and last piece of equipment    Resistance Training Performed Yes    VAD Patient? No    PAD/SET Patient? No      Pain Assessment   Currently in Pain? No/denies                Social History   Tobacco Use  Smoking Status Former   Packs/day: 0.25   Years: 13.00   Total pack years: 3.25   Types: Cigarettes  Smokeless Tobacco Never  Tobacco Comments   age [age]    Goals Met:  Independence with exercise equipment Exercise tolerated well No report of concerns or symptoms today Strength training completed today  Goals Unmet:  Not Applicable  Comments: Pt able to follow exercise prescription today without complaint.  Will continue to monitor for progression.    Dr. Emily Filbert is Medical Director for [place].  Dr. Ottie Glazier is Medical Director for [facility] Pulmonary Rehabilitation.

## 2022-03-12 ENCOUNTER — Encounter: Payer: Medicaid Other | Admitting: *Deleted

## 2022-03-12 DIAGNOSIS — Z952 Presence of prosthetic heart valve: Secondary | ICD-10-CM | POA: Diagnosis not present

## 2022-03-12 NOTE — Progress Notes (Signed)
Daily Session Note  Patient Details  Name: AEVA POSEY MRN: [id number] Date of Birth: 1960/09/29 Referring Provider:   Flowsheet Row Cardiac Rehab from 02/24/2022 in [facility] Cardiac and Pulmonary Rehab  Referring Provider Serafina Royals MD       Encounter Date: 03/12/2022  Check In:  Session Check In - 03/12/22 1137       Check-In   Supervising physician immediately available to respond to emergencies See telemetry face sheet for immediately available ER MD    Location [facility]-Cardiac & Pulmonary Rehab    Staff Present Alberteen Sam, MA, RCEP, CCRP, CCET;Joseph Hortense, Ernestina Patches, RN, ADN;Meredith Sherryll Burger, RN BSN    Virtual Visit No    Medication changes reported     No    Fall or balance concerns reported    No    Warm-up and Cool-down Performed on first and last piece of equipment    Resistance Training Performed Yes    VAD Patient? No    PAD/SET Patient? No      Pain Assessment   Currently in Pain? No/denies                Social History   Tobacco Use  Smoking Status Former   Packs/day: 0.25   Years: 13.00   Total pack years: 3.25   Types: Cigarettes  Smokeless Tobacco Never  Tobacco Comments   age [age]    Goals Met:  Independence with exercise equipment Exercise tolerated well No report of concerns or symptoms today Strength training completed today  Goals Unmet:  Not Applicable  Comments: Pt able to follow exercise prescription today without complaint.  Will continue to monitor for progression.    Dr. Emily Filbert is Medical Director for [place].  Dr. Ottie Glazier is Medical Director for [facility] Pulmonary Rehabilitation.

## 2022-03-24 ENCOUNTER — Encounter: Payer: Medicaid Other | Admitting: *Deleted

## 2022-03-24 DIAGNOSIS — Z952 Presence of prosthetic heart valve: Secondary | ICD-10-CM

## 2022-03-24 NOTE — Progress Notes (Signed)
Daily Session Note  Patient Details  Name: IVRY PIGUE MRN: [id number] Date of Birth: 02/25/1961 Referring Provider:   Flowsheet Row Cardiac Rehab from 02/24/2022 in [facility] Cardiac and Pulmonary Rehab  Referring Provider Serafina Royals MD       Encounter Date: 03/24/2022  Check In:  Session Check In - 03/24/22 1110       Check-In   Supervising physician immediately available to respond to emergencies See telemetry face sheet for immediately available ER MD    Location [facility]-Cardiac & Pulmonary Rehab    Staff Present Alberteen Sam, MA, RCEP, CCRP, Marylynn Pearson, MS, ASCM CEP, Exercise Physiologist;Odeth Bry Sherryll Burger, RN BSN    Virtual Visit No    Medication changes reported     No    Fall or balance concerns reported    No    Warm-up and Cool-down Performed on first and last piece of equipment    Resistance Training Performed Yes    VAD Patient? No    PAD/SET Patient? No      Pain Assessment   Currently in Pain? No/denies                Social History   Tobacco Use  Smoking Status Former   Packs/day: 0.25   Years: 13.00   Total pack years: 3.25   Types: Cigarettes  Smokeless Tobacco Never  Tobacco Comments   age [age]    Goals Met:  Independence with exercise equipment Exercise tolerated well No report of concerns or symptoms today Strength training completed today  Goals Unmet:  Not Applicable  Comments: Pt able to follow exercise prescription today without complaint.  Will continue to monitor for progression.    Dr. Emily Filbert is Medical Director for [place].  Dr. Ottie Glazier is Medical Director for [facility] Pulmonary Rehabilitation.

## 2022-03-25 ENCOUNTER — Encounter: Payer: Self-pay | Admitting: *Deleted

## 2022-03-25 ENCOUNTER — Encounter: Payer: Medicaid Other | Admitting: *Deleted

## 2022-03-25 DIAGNOSIS — Z952 Presence of prosthetic heart valve: Secondary | ICD-10-CM

## 2022-03-25 NOTE — Progress Notes (Signed)
Daily Session Note  Patient Details  Name: ZOEE HEENEY MRN: [id number] Date of Birth: 1960-12-15 Referring Provider:   Flowsheet Row Cardiac Rehab from 02/24/2022 in [facility] Cardiac and Pulmonary Rehab  Referring Provider Serafina Royals MD       Encounter Date: 03/25/2022  Check In:  Session Check In - 03/25/22 1114       Check-In   Supervising physician immediately available to respond to emergencies See telemetry face sheet for immediately available ER MD    Location [facility]-Cardiac & Pulmonary Rehab    Staff Present Antionette Fairy, BS, Exercise Physiologist;Meredith Sherryll Burger, RN BSN;Jessica Luan Pulling, MA, RCEP, CCRP, Mindi Curling, RN, [place]    Virtual Visit No    Medication changes reported     No    Fall or balance concerns reported    No    Warm-up and Cool-down Performed on first and last piece of equipment    Resistance Training Performed Yes    VAD Patient? No    PAD/SET Patient? No      Pain Assessment   Currently in Pain? No/denies               Exercise Prescription Changes - 03/25/22 1100       Home Exercise Plan   Plans to continue exercise at Home (comment)   walking   Frequency Add 3 additional days to program exercise sessions.    Initial Home Exercises Provided 03/25/22      Oxygen   Maintain Oxygen Saturation 88% or higher             Social History   Tobacco Use  Smoking Status Former   Packs/day: 0.25   Years: 13.00   Total pack years: 3.25   Types: Cigarettes  Smokeless Tobacco Never  Tobacco Comments   age [age]    Goals Met:  Independence with exercise equipment Exercise tolerated well No report of concerns or symptoms today Strength training completed today  Goals Unmet:  Not Applicable  Comments: Pt able to follow exercise prescription today without complaint.  Will continue to monitor for progression.    Dr. Emily Filbert is Medical Director for [place].  Dr. Ottie Glazier is Medical  Director for [facility] Pulmonary Rehabilitation.

## 2022-03-25 NOTE — Progress Notes (Signed)
Cardiac Individual Treatment Plan  Patient Details  Name: Nancy [name] MRN: [id number] Date of Birth: [birth year] Referring Provider:   Flowsheet Row Cardiac Rehab from 02/24/2022 in [facility] Cardiac and Pulmonary Rehab  Referring Provider Serafina Royals MD       Initial Encounter Date:  Flowsheet Row Cardiac Rehab from 02/24/2022 in [facility] Cardiac and Pulmonary Rehab  Date 02/24/22       Visit Diagnosis: S/P AVR (aortic valve replacement)  Patient's Home Medications on Admission:  Current Outpatient Medications:    acetaminophen-codeine (TYLENOL #3) 300-30 MG tablet, Take 2 tablets by mouth at bedtime., Disp: , Rfl: 0   albuterol (PROVENTIL HFA;VENTOLIN HFA) 108 (90 Base) MCG/ACT inhaler, Inhale 2 puffs into the lungs every 6 (six) hours as needed for wheezing or shortness of breath., Disp: , Rfl:    amLODipine (NORVASC) 10 MG tablet, Take 10 mg by mouth every morning. , Disp: , Rfl: 5   aspirin EC 81 MG tablet, Take 81 mg by mouth daily., Disp: , Rfl:    CINNAMON PO, Take 2 capsules by mouth 2 (two) times daily., Disp: , Rfl:    diclofenac sodium (VOLTAREN) 1 % GEL, Apply 1 application topically daily as needed (for pain)., Disp: , Rfl:    furosemide (LASIX) 40 MG tablet, Take 1 tablet (40 mg total) by mouth daily., Disp: 30 tablet, Rfl: 11   gabapentin (NEURONTIN) 300 MG capsule, Take 300 mg by mouth at bedtime., Disp: , Rfl: 3   gabapentin (NEURONTIN) 300 MG capsule, Take 1 capsule (300 mg total) by mouth 3 (three) times daily for 21 days., Disp: 63 capsule, Rfl: 0   GARCINIA CAMBOGIA-CHROMIUM PO, Take 2 capsules by mouth daily., Disp: , Rfl:    JARDIANCE 25 MG TABS tablet, Take 25 mg by mouth daily., Disp: , Rfl:    LANTUS SOLOSTAR 100 UNIT/ML Solostar Pen, Inject 38 Units into the skin at bedtime., Disp: , Rfl: 3   losartan (COZAAR) 100 MG tablet, Take 100 mg by mouth every morning. , Disp: , Rfl: 5   metoprolol succinate (TOPROL-XL) 50 MG 24 hr tablet, Take 50 mg by mouth  every morning. , Disp: , Rfl: 2   montelukast (SINGULAIR) 10 MG tablet, Take 10 mg by mouth daily., Disp: , Rfl:    nystatin cream (MYCOSTATIN), Apply topically., Disp: , Rfl:    OZEMPIC, 2 MG/DOSE, 8 MG/3ML SOPN, Inject into the skin., Disp: , Rfl:    Probiotic Product (PROBIOTIC PO), Take 1 capsule by mouth daily., Disp: , Rfl:    SILVADENE 1 % cream, SMARTSIG:sparingly Topical Twice Daily PRN, Disp: , Rfl:    SYMBICORT 160-4.5 MCG/ACT inhaler, Inhale 2 puffs into the lungs 2 (two) times daily as needed., Disp: , Rfl: 5   traZODone (DESYREL) 50 MG tablet, Take 0.5 tablets (25 mg total) by mouth at bedtime as needed for sleep., Disp: 30 tablet, Rfl: 0   TURMERIC PO, Take 1 capsule by mouth daily., Disp: , Rfl:   Past Medical History: Past Medical History:  Diagnosis Date   Anxiety    Arthritis    Asthma    Depression    Diabetes mellitus without complication (HCC)    GERD (gastroesophageal reflux disease)    no meds   Heart murmur    History of kidney stones    h/o   Hypertension    Myocardial infarction ([place]) 2015   Pneumonia 2014   Seizures ([place])    as a child-no since age 26  Severe aortic stenosis     Tobacco Use: Social History   Tobacco Use  Smoking Status Former   Packs/day: 0.25   Years: 13.00   Total pack years: 3.25   Types: Cigarettes  Smokeless Tobacco Never  Tobacco Comments   age [age]    Labs: Review Flowsheet       Latest Ref Rng & Units 01/03/2022  Labs for ITP Cardiac and Pulmonary Rehab  Hemoglobin A1c 4.8 - 5.6 % 8.4      Exercise Target Goals: Exercise Program Goal: Individual exercise prescription set using results from initial 6 min walk test and THRR while considering  patient's activity barriers and safety.   Exercise Prescription Goal: Initial exercise prescription builds to 30-45 minutes a day of aerobic activity, 2-3 days per week.  Home exercise guidelines will be given to patient during program as part of exercise prescription  that the participant will acknowledge.   Education: Aerobic Exercise: - Group verbal and visual presentation on the components of exercise prescription. Introduces F.I.T.T principle from ACSM for exercise prescriptions.  Reviews F.I.T.T. principles of aerobic exercise including progression. Written material given at graduation. Flowsheet Row Cardiac Rehab from 03/12/2022 in [facility] Cardiac and Pulmonary Rehab  Education need identified 02/24/22  Date 03/05/22  Educator [facility]  Instruction Review Code 1- Verbalizes Understanding       Education: Resistance Exercise: - Group verbal and visual presentation on the components of exercise prescription. Introduces F.I.T.T principle from ACSM for exercise prescriptions  Reviews F.I.T.T. principles of resistance exercise including progression. Written material given at graduation.    Education: Exercise & Equipment Safety: - Individual verbal instruction and demonstration of equipment use and safety with use of the equipment. Flowsheet Row Cardiac Rehab from 03/12/2022 in [facility] Cardiac and Pulmonary Rehab  Date 02/24/22  Educator NT  Instruction Review Code 1- Verbalizes Understanding       Education: Exercise Physiology & General Exercise Guidelines: - Group verbal and written instruction with models to review the exercise physiology of the cardiovascular system and associated critical values. Provides general exercise guidelines with specific guidelines to those with heart or lung disease.    Education: Flexibility, Balance, Mind/Body Relaxation: - Group verbal and visual presentation with interactive activity on the components of exercise prescription. Introduces F.I.T.T principle from ACSM for exercise prescriptions. Reviews F.I.T.T. principles of flexibility and balance exercise training including progression. Also discusses the mind body connection.  Reviews various relaxation techniques to help reduce and manage stress (i.e. Deep breathing,  progressive muscle relaxation, and visualization). Balance handout provided to take home. Written material given at graduation.   Activity Barriers & Risk Stratification:  Activity Barriers & Cardiac Risk Stratification - 02/24/22 1008       Activity Barriers & Cardiac Risk Stratification   Activity Barriers Arthritis;Other (comment);Joint Problems    Comments right foot- broken toe, Hip Pain    Cardiac Risk Stratification Moderate             6 Minute Walk:  6 Minute Walk     Row Name 02/24/22 1006         6 Minute Walk   Phase Initial     Distance 1085 feet     Walk Time 6 minutes     # of Rest Breaks 0     MPH 2.05     METS 2.82     RPE 7     Perceived Dyspnea  0     VO2 Peak 9.87  Symptoms Yes (comment)     Comments Hip Pain 8/10     Resting HR 90 bpm     Resting BP 142/78     Resting Oxygen Saturation  97 %     Exercise Oxygen Saturation  during 6 min walk 95 %     Max Ex. HR 107 bpm     Max Ex. BP 144/84     2 Minute Post BP 140/78              Oxygen Initial Assessment:   Oxygen Re-Evaluation:   Oxygen Discharge (Final Oxygen Re-Evaluation):   Initial Exercise Prescription:  Initial Exercise Prescription - 02/24/22 1000       Date of Initial Exercise RX and Referring Provider   Date 02/24/22    Referring Provider Arnoldo Hooker MD      Oxygen   Maintain Oxygen Saturation 88% or higher      Treadmill   MPH 1.8    Grade 0.5    Minutes 15    METs 2.5      NuStep   Level 2    SPM 80    Minutes 15    METs 2.82      Recumbant Elliptical   Level 1    RPM 50    Watts 23    Minutes 15    METs 2.82      T5 Nustep   Level 1    SPM 80    Minutes 15    METs 2.82      Prescription Details   Frequency (times per week) 2    Duration Progress to 30 minutes of continuous aerobic without signs/symptoms of physical distress      Intensity   THRR 40-80% of Max Heartrate 117-144    Ratings of Perceived Exertion 11-13     Perceived Dyspnea 0-4      Progression   Progression Continue to progress workloads to maintain intensity without signs/symptoms of physical distress.      Resistance Training   Training Prescription Yes    Weight 3 lb    Reps 10-15             Perform Capillary Blood Glucose checks as needed.  Exercise Prescription Changes:   Exercise Prescription Changes     Row Name 02/24/22 1000 03/17/22 1500           Response to Exercise   Blood Pressure (Admit) 142/78 132/88      Blood Pressure (Exercise) 144/84 158/82      Blood Pressure (Exit) 140/78 128/80      Heart Rate (Admit) 90 bpm 91 bpm      Heart Rate (Exercise) 107 bpm 105 bpm      Heart Rate (Exit) 95 bpm 92 bpm      Oxygen Saturation (Admit) 97 % --      Oxygen Saturation (Exercise) 95 % --      Rating of Perceived Exertion (Exercise) 7 12      Perceived Dyspnea (Exercise) 0 --      Symptoms Hip Pain 8/10 calf pain      Comments Results First four rehab sessions      Duration -- Progress to 30 minutes of  aerobic without signs/symptoms of physical distress      Intensity -- THRR unchanged        Progression   Progression -- Continue to progress workloads to maintain intensity without signs/symptoms of physical distress.  Average METs -- 2.67        Resistance Training   Training Prescription -- Yes      Weight -- 3 lb      Reps -- 10-15        Interval Training   Interval Training -- No        Treadmill   MPH -- 2      Grade -- 0.5      Minutes -- 15      METs -- 2.67        NuStep   Level -- 6      Minutes -- 15      METs -- 5        Recumbant Elliptical   Level -- 3      Minutes -- 15      METs -- 2.6        Oxygen   Maintain Oxygen Saturation -- 88% or higher               Exercise Comments:   Exercise Comments     Row Name 03/03/22 1149           Exercise Comments First full day of exercise!  Patient was oriented to gym and equipment including functions,  settings, policies, and procedures.  Patient's individual exercise prescription and treatment plan were reviewed.  All starting workloads were established based on the results of the 6 minute walk test done at initial orientation visit.  The plan for exercise progression was also introduced and progression will be customized based on patient's performance and goals.                Exercise Goals and Review:   Exercise Goals     Row Name 02/24/22 1023             Exercise Goals   Increase Physical Activity Yes       Intervention Provide advice, education, support and counseling about physical activity/exercise needs.;Develop an individualized exercise prescription for aerobic and resistive training based on initial evaluation findings, risk stratification, comorbidities and participant's personal goals.       Expected Outcomes Short Term: Attend rehab on a regular basis to increase amount of physical activity.;Long Term: Add in home exercise to make exercise part of routine and to increase amount of physical activity.;Long Term: Exercising regularly at least 3-5 days a week.       Increase Strength and Stamina Yes       Intervention Provide advice, education, support and counseling about physical activity/exercise needs.;Develop an individualized exercise prescription for aerobic and resistive training based on initial evaluation findings, risk stratification, comorbidities and participant's personal goals.       Expected Outcomes Short Term: Increase workloads from initial exercise prescription for resistance, speed, and METs.;Short Term: Perform resistance training exercises routinely during rehab and add in resistance training at home;Long Term: Improve cardiorespiratory fitness, muscular endurance and strength as measured by increased METs and functional capacity ( )       Able to understand and use rate of perceived exertion (RPE) scale Yes       Intervention Provide education and  explanation on how to use RPE scale       Expected Outcomes Short Term: Able to use RPE daily in rehab to express subjective intensity level;Long Term:  Able to use RPE to guide intensity level when exercising independently       Able to understand and use Dyspnea scale Yes  Intervention Provide education and explanation on how to use Dyspnea scale       Expected Outcomes Short Term: Able to use Dyspnea scale daily in rehab to express subjective sense of shortness of breath during exertion;Long Term: Able to use Dyspnea scale to guide intensity level when exercising independently       Knowledge and understanding of Target Heart Rate Range (THRR) Yes       Intervention Provide education and explanation of THRR including how the numbers were predicted and where they are located for reference       Expected Outcomes Short Term: Able to state/look up THRR;Long Term: Able to use THRR to govern intensity when exercising independently;Short Term: Able to use daily as guideline for intensity in rehab       Able to check pulse independently Yes       Intervention Provide education and demonstration on how to check pulse in carotid and radial arteries.;Review the importance of being able to check your own pulse for safety during independent exercise       Expected Outcomes Short Term: Able to explain why pulse checking is important during independent exercise;Long Term: Able to check pulse independently and accurately       Understanding of Exercise Prescription Yes       Intervention Provide education, explanation, and written materials on patient's individual exercise prescription       Expected Outcomes Short Term: Able to explain program exercise prescription;Long Term: Able to explain home exercise prescription to exercise independently                Exercise Goals Re-Evaluation :  Exercise Goals Re-Evaluation     Row Name 02/24/22 1025 03/03/22 1149 03/10/22 1141 03/17/22 1521        Exercise Goal Re-Evaluation   Exercise Goals Review -- Knowledge and understanding of Target Heart Rate Range (THRR);Able to understand and use rate of perceived exertion (RPE) scale;Understanding of Exercise Prescription;Able to understand and use Dyspnea scale Knowledge and understanding of Target Heart Rate Range (THRR);Able to understand and use rate of perceived exertion (RPE) scale;Understanding of Exercise Prescription;Able to understand and use Dyspnea scale Understanding of Exercise Prescription;Increase Physical Activity;Increase Strength and Stamina    Comments -- Reviewed RPE scale, THR and program prescription with pt today.  Pt voiced understanding and was given a copy of goals to take home. She reports not walking as much outside due to the colder weather. She has thought about getting a stepper for thier home. EP has not gone over home exercise yet. Zuma is doing well in rehab. She averaged 2.67 METs during her first three rehab sessions. She also was able to increase her speed on the treadmill to 2 mph. She improved to level 3 on the REL and level 6 on the T4 as well. We will continue to monitor her progress in the program.    Expected Outcomes -- Short: Use RPE daily to regulate intensity. Long: Follow program prescription in THR. Short: EP to go over home exercise. Long: Follow program prescription in THR. Short: Continue to increase workloads as tolerated. Long: Continue to increase strength and stamina.             Discharge Exercise Prescription (Final Exercise Prescription Changes):  Exercise Prescription Changes - 03/17/22 1500       Response to Exercise   Blood Pressure (Admit) 132/88    Blood Pressure (Exercise) 158/82    Blood Pressure (Exit) 128/80  Heart Rate (Admit) 91 bpm    Heart Rate (Exercise) 105 bpm    Heart Rate (Exit) 92 bpm    Rating of Perceived Exertion (Exercise) 12    Symptoms calf pain    Comments First four rehab sessions    Duration Progress  to 30 minutes of  aerobic without signs/symptoms of physical distress    Intensity THRR unchanged      Progression   Progression Continue to progress workloads to maintain intensity without signs/symptoms of physical distress.    Average METs 2.67      Resistance Training   Training Prescription Yes    Weight 3 lb    Reps 10-15      Interval Training   Interval Training No      Treadmill   MPH 2    Grade 0.5    Minutes 15    METs 2.67      NuStep   Level 6    Minutes 15    METs 5      Recumbant Elliptical   Level 3    Minutes 15    METs 2.6      Oxygen   Maintain Oxygen Saturation 88% or higher             Nutrition:  Target Goals: Understanding of nutrition guidelines, daily intake of sodium 1500mg , cholesterol 200mg , calories 30% from fat and 7% or less from saturated fats, daily to have 5 or more servings of fruits and vegetables.  Education: All About Nutrition: -Group instruction provided by verbal, written material, interactive activities, discussions, models, and posters to present general guidelines for heart healthy nutrition including fat, fiber, MyPlate, the role of sodium in heart healthy nutrition, utilization of the nutrition label, and utilization of this knowledge for meal planning. Follow up email sent as well. Written material given at graduation. Flowsheet Row Cardiac Rehab from 03/12/2022 in [facility] Cardiac and Pulmonary Rehab  Education need identified 02/24/22  Date 03/12/22  Educator [facility]  Instruction Review Code 1- Verbalizes Understanding       Biometrics:  Pre Biometrics - 02/24/22 1026       Pre Biometrics   Height 5' 4.25" (1.632 m)    Weight 189 lb 12.8 oz (86.1 kg)    Waist Circumference 45 inches    Hip Circumference 45.5 inches    Waist to Hip Ratio 0.99 %    BMI (Calculated) 32.32    Single Leg Stand 14.6 seconds              Nutrition Therapy Plan and Nutrition Goals:  Nutrition Therapy & Goals - 02/24/22 0958        Nutrition Therapy   Diet Heart healthy, low Na, T2DM MNT    Drug/Food Interactions Coumadin/Vit K    Protein (specify units) 75-85g    Fiber 28 grams    Whole Grain Foods 3 servings    Saturated Fats 15 max. grams    Fruits and Vegetables 8 servings/day    Sodium 2 grams      Personal Nutrition Goals   Nutrition Goal ST: switch to whole wheat bread, include fruits/vegetables at most meals using MyPlate as a guide, eat small and frequent meals with consistent CHO, eat consistent amounts of vitamin K daily LT: limit saturated fat <15g, achieve and maintain A1C <7, eat at least 28g of fiber per day, manage INR within recommended range by MD    Comments 61 y.o. F admitted to cardiac rehab s/p  AVR. PMHx inlcudes diverticulosis, T2DM, former smoker, HTN, HLD, MI, CHF. Relevant medications includes coumadin, lantus, probiotic, ozempic. Recent A1C 01/05/22: 8.5. Arlissa reports having a green house and garden, she reports they eat of a lot of fish as they go fishing often. Food recall includes: B: grits or oatmeal with eggs and bacon L: pimento cheese sandwich (honey-wheat) D: pizza, stirfry. Fish, chicken (baked) with skin (air frier), shrimp - olive oil, canola oil. or cereal (total).  S: ice cream (1 black cherry cone). Discussed T2DM MNT, ozempic meal planning (smaller more frequent meals recommended), heart healthy eating, and warfarin MNT.      Intervention Plan   Intervention Prescribe, educate and counsel regarding individualized specific dietary modifications aiming towards targeted core components such as weight, hypertension, lipid management, diabetes, heart failure and other comorbidities.;Nutrition handout(s) given to patient.    Expected Outcomes Short Term Goal: Understand basic principles of dietary content, such as calories, fat, sodium, cholesterol and nutrients.;Short Term Goal: A plan has been developed with personal nutrition goals set during dietitian appointment.;Long Term Goal:  Adherence to prescribed nutrition plan.             Nutrition Assessments:  MEDIFICTS Score Key: ?70 Need to make dietary changes  40-70 Heart Healthy Diet ? 40 Therapeutic Level Cholesterol Diet  Flowsheet Row Cardiac Rehab from 02/24/2022 in [facility] Cardiac and Pulmonary Rehab  Picture Your Plate Total Score on Admission 52      Picture Your Plate Scores: <16 Unhealthy dietary pattern with much room for improvement. 41-50 Dietary pattern unlikely to meet recommendations for good health and room for improvement. 51-60 More healthful dietary pattern, with some room for improvement.  >60 Healthy dietary pattern, although there may be some specific behaviors that could be improved.    Nutrition Goals Re-Evaluation:  Nutrition Goals Re-Evaluation     Row Name 03/10/22 1136             Goals   Nutrition Goal ST: switch to whole wheat bread, include fruits/vegetables at most meals using MyPlate as a guide, eat small and frequent meals with consistent CHO, eat consistent amounts of vitamin K daily LT: limit saturated fat <15g, achieve and maintain A1C <7, eat at least 28g of fiber per day, manage INR within recommended range by MD       Comment Laney reports doing well with her nutrition and has been getting in more fruits/vegetables, but hasd been doing more fruit and dried fruit. She feels she may be having too much fruit, discussed pairing fruit with healthy fat and protein - she has it with nuts/seeds, but has multiple servings of fruit; discussed breaking up this snack to avoid having too mnay CHO at one time. Encouraged to continue with changes made.       Expected Outcome ST: switch to whole wheat bread, include fruits/vegetables at most meals using MyPlate as a guide, eat small and frequent meals with consistent CHO, eat consistent amounts of vitamin K daily LT: limit saturated fat <15g, achieve and maintain A1C <7, eat at least 28g of fiber per day, manage INR within  recommended range by MD                Nutrition Goals Discharge (Final Nutrition Goals Re-Evaluation):  Nutrition Goals Re-Evaluation - 03/10/22 1136       Goals   Nutrition Goal ST: switch to whole wheat bread, include fruits/vegetables at most meals using MyPlate as a guide, eat small and frequent meals  with consistent CHO, eat consistent amounts of vitamin K daily LT: limit saturated fat <15g, achieve and maintain A1C <7, eat at least 28g of fiber per day, manage INR within recommended range by MD    Comment Yeni reports doing well with her nutrition and has been getting in more fruits/vegetables, but hasd been doing more fruit and dried fruit. She feels she may be having too much fruit, discussed pairing fruit with healthy fat and protein - she has it with nuts/seeds, but has multiple servings of fruit; discussed breaking up this snack to avoid having too mnay CHO at one time. Encouraged to continue with changes made.    Expected Outcome ST: switch to whole wheat bread, include fruits/vegetables at most meals using MyPlate as a guide, eat small and frequent meals with consistent CHO, eat consistent amounts of vitamin K daily LT: limit saturated fat <15g, achieve and maintain A1C <7, eat at least 28g of fiber per day, manage INR within recommended range by MD             Psychosocial: Target Goals: Acknowledge presence or absence of significant depression and/or stress, maximize coping skills, provide positive support system. Participant is able to verbalize types and ability to use techniques and skills needed for reducing stress and depression.   Education: Stress, Anxiety, and Depression - Group verbal and visual presentation to define topics covered.  Reviews how body is impacted by stress, anxiety, and depression.  Also discusses healthy ways to reduce stress and to treat/manage anxiety and depression.  Written material given at graduation.   Education: Sleep  Hygiene -Provides group verbal and written instruction about how sleep can affect your health.  Define sleep hygiene, discuss sleep cycles and impact of sleep habits. Review good sleep hygiene tips.    Initial Review & Psychosocial Screening:  Initial Psych Review & Screening - 02/05/22 1443       Initial Review   Current issues with None Identified      Family Dynamics   Good Support System? Yes   husband, sisters     Barriers   Psychosocial barriers to participate in program There are no identifiable barriers or psychosocial needs.      Screening Interventions   Interventions Encouraged to exercise;Provide feedback about the scores to participant;To provide support and resources with identified psychosocial needs    Expected Outcomes Short Term goal: Utilizing psychosocial counselor, staff and physician to assist with identification of specific Stressors or current issues interfering with healing process. Setting desired goal for each stressor or current issue identified.;Long Term Goal: Stressors or current issues are controlled or eliminated.;Short Term goal: Identification and review with participant of any Quality of Life or Depression concerns found by scoring the questionnaire.;Long Term goal: The participant improves quality of Life and PHQ9 Scores as seen by post scores and/or verbalization of changes             Quality of Life Scores:   Quality of Life - 02/24/22 1005       Quality of Life   Select Quality of Life      Quality of Life Scores   Health/Function Pre 14.36 %    Socioeconomic Pre 15.25 %    Psych/Spiritual Pre 15 %    Family Pre 18 %    GLOBAL Pre 15.24 %            Scores of 19 and below usually indicate a poorer quality of life in these areas.  A difference of  2-3 points is a clinically meaningful difference.  A difference of 2-3 points in the total score of the Quality of Life Index has been associated with significant improvement in overall  quality of life, self-image, physical symptoms, and general health in studies assessing change in quality of life.  PHQ-9: Review Flowsheet       03/24/2022 02/24/2022  Depression screen PHQ 2/9  Decreased Interest 0 1  Down, Depressed, Hopeless 0 1  PHQ - 2 Score 0 2  Altered sleeping 1 2  Tired, decreased energy 1 1  Change in appetite 1 1  Feeling bad or failure about yourself  0 2  Trouble concentrating 1 1  Moving slowly or fidgety/restless 2 3  Suicidal thoughts 0 0  PHQ-9 Score 6 12  Difficult doing work/chores Somewhat difficult Somewhat difficult   Interpretation of Total Score  Total Score Depression Severity:  1-4 = Minimal depression, 5-9 = Mild depression, 10-14 = Moderate depression, 15-19 = Moderately severe depression, 20-27 = Severe depression   Psychosocial Evaluation and Intervention:  Psychosocial Evaluation - 02/05/22 1449       Psychosocial Evaluation & Interventions   Interventions Encouraged to exercise with the program and follow exercise prescription;Stress management education;Relaxation education    Comments 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 She reports that there was quite a bit of pain, but it is easing off now. Her IV did infiltrate in the hospital and her arm is still sore. Her sleep habits are returning to normal. She does not express any stress concerns at this time, besided just the healing process. Her husband and sisters are her main support and he plans on coming with her to education. She wants to come to the program to learn more of what she should be doing to help her health.    Expected Outcomes Short: attend cardiac rehab for education and exericse. Long: develop and maintain positive self care habits.    Continue Psychosocial Services  Follow up required by staff             Psychosocial Re-Evaluation:  Psychosocial Re-Evaluation     Row Name 03/10/22 1148 03/24/22 1110           Psychosocial Re-Evaluation    Current issues with Current Stress Concerns Current Stress Concerns;Current Depression      Comments Kaylean reports that her husband is a good support system for her and her sister as well, but to a lesser capacity. She reports being stressed for her son, but did not want to discuss further at this time. She reports walking, reading the bible, and playing wordscape to help with stress reduction. She reports sleeping well now without restless leg. Kathaleya has improved her PHQ from 12 down to 6.  She has changed up her medication timing and is sleeping better and feeling better.  She has also enjoyed  having family around this week for Thanksgiving!!      Expected Outcomes ST: continue with stress reducing habits LT: manage stress by utilizing tools and support system SHort: Conitnue to focus on positive Long: Continue to take care of herself      Interventions Encouraged to attend Cardiac Rehabilitation for the exercise Stress management education;Encouraged to attend Cardiac Rehabilitation for the exercise      Continue Psychosocial Services  Follow up required by staff Follow up required by staff               Psychosocial  Discharge (Final Psychosocial Re-Evaluation):  Psychosocial Re-Evaluation - 03/24/22 1110       Psychosocial Re-Evaluation   Current issues with Current Stress Concerns;Current Depression    Comments Gwenlyn has improved her PHQ from 12 down to 6.  She has changed up her medication timing and is sleeping better and feeling better.  She has also enjoyed  having family around this week for Thanksgiving!!    Expected Outcomes SHort: Conitnue to focus on positive Long: Continue to take care of herself    Interventions Stress management education;Encouraged to attend Cardiac Rehabilitation for the exercise    Continue Psychosocial Services  Follow up required by staff             Vocational Rehabilitation: Provide vocational rehab assistance to qualifying candidates.    Vocational Rehab Evaluation & Intervention:  Vocational Rehab - 02/05/22 1443       Initial Vocational Rehab Evaluation & Intervention   Assessment shows need for Vocational Rehabilitation No             Education: Education Goals: Education classes will be provided on a variety of topics geared toward better understanding of heart health and risk factor modification. Participant will state understanding/return demonstration of topics presented as noted by education test scores.  Learning Barriers/Preferences:  Learning Barriers/Preferences - 02/05/22 1443       Learning Barriers/Preferences   Learning Barriers None    Learning Preferences Individual Instruction             General Cardiac Education Topics:  AED/CPR: - Group verbal and written instruction with the use of models to demonstrate the basic use of the AED with the basic ABC's of resuscitation.   Anatomy and Cardiac Procedures: - Group verbal and visual presentation and models provide information about basic cardiac anatomy and function. Reviews the testing methods done to diagnose heart disease and the outcomes of the test results. Describes the treatment choices: Medical Management, Angioplasty, or Coronary Bypass Surgery for treating various heart conditions including Myocardial Infarction, Angina, Valve Disease, and Cardiac Arrhythmias.  Written material given at graduation. Flowsheet Row Cardiac Rehab from 03/12/2022 in [facility] Cardiac and Pulmonary Rehab  Education need identified 02/24/22       Medication Safety: - Group verbal and visual instruction to review commonly prescribed medications for heart and lung disease. Reviews the medication, class of the drug, and side effects. Includes the steps to properly store meds and maintain the prescription regimen.  Written material given at graduation.   Intimacy: - Group verbal instruction through game format to discuss how heart and lung disease can affect  sexual intimacy. Written material given at graduation.. Flowsheet Row Cardiac Rehab from 03/12/2022 in [facility] Cardiac and Pulmonary Rehab  Date 03/05/22  Educator [facility]  Instruction Review Code 1- Verbalizes Understanding       Know Your Numbers and Heart Failure: - Group verbal and visual instruction to discuss disease risk factors for cardiac and pulmonary disease and treatment options.  Reviews associated critical values for Overweight/Obesity, Hypertension, Cholesterol, and Diabetes.  Discusses basics of heart failure: signs/symptoms and treatments.  Introduces Heart Failure Zone chart for action plan for heart failure.  Written material given at graduation.   Infection Prevention: - Provides verbal and written material to individual with discussion of infection control including proper hand washing and proper equipment cleaning during exercise session. Flowsheet Row Cardiac Rehab from 03/12/2022 in [facility] Cardiac and Pulmonary Rehab  Date 02/24/22  Educator NT  Instruction Review Code  1- Verbalizes Understanding       Falls Prevention: - Provides verbal and written material to individual with discussion of falls prevention and safety. Flowsheet Row Cardiac Rehab from 03/12/2022 in [facility] Cardiac and Pulmonary Rehab  Date 02/24/22  Educator NT  Instruction Review Code 1- Verbalizes Understanding       Other: -Provides group and verbal instruction on various topics (see comments)   Knowledge Questionnaire Score:  Knowledge Questionnaire Score - 02/24/22 0948       Knowledge Questionnaire Score   Pre Score 22/26             Core Components/Risk Factors/Patient Goals at Admission:  Personal Goals and Risk Factors at Admission - 02/24/22 0953       Core Components/Risk Factors/Patient Goals on Admission    Weight Management Yes;Weight Loss    Intervention Weight Management: Develop a combined nutrition and exercise program designed to reach desired caloric intake, while  maintaining appropriate intake of nutrient and fiber, sodium and fats, and appropriate energy expenditure required for the weight goal.;Weight Management: Provide education and appropriate resources to help participant work on and attain dietary goals.    Admit Weight 189 lb 12.8 oz (86.1 kg)    Goal Weight: Short Term 184 lb (83.5 kg)    Goal Weight: Long Term 179 lb (81.2 kg)    Expected Outcomes Short Term: Continue to assess and modify interventions until short term weight is achieved;Long Term: Adherence to nutrition and physical activity/exercise program aimed toward attainment of established weight goal;Weight Loss: Understanding of general recommendations for a balanced deficit meal plan, which promotes 1-2 lb weight loss per week and includes a negative energy balance of [phone] kcal/d;Understanding recommendations for meals to include 15-35% energy as protein, 25-35% energy from fat, 35-60% energy from carbohydrates, less than  of dietary cholesterol, 20-35 gm of total fiber daily;Understanding of distribution of calorie intake throughout the day with the consumption of 4-5 meals/snacks;Weight Gain: Understanding of general recommendations for a high calorie, high protein meal plan that promotes weight gain by distributing calorie intake throughout the day with the consumption for 4-5 meals, snacks, and/or supplements    Diabetes Yes    Intervention Provide education about signs/symptoms and action to take for hypo/hyperglycemia.;Provide education about proper nutrition, including hydration, and aerobic/resistive exercise prescription along with prescribed medications to achieve blood glucose in normal ranges: Fasting glucose 65-99 mg/dL    Expected Outcomes Short Term: Participant verbalizes understanding of the signs/symptoms and immediate care of hyper/hypoglycemia, proper foot care and importance of medication, aerobic/resistive exercise and nutrition plan for blood glucose control.;Long  Term: Attainment of HbA1C < 7%.    Hypertension Yes    Intervention Provide education on lifestyle modifcations including regular physical activity/exercise, weight management, moderate sodium restriction and increased consumption of fresh fruit, vegetables, and low fat dairy, alcohol moderation, and smoking cessation.;Monitor prescription use compliance.    Expected Outcomes Short Term: Continued assessment and intervention until BP is < 140/27mm HG in hypertensive participants. < 130/46mm HG in hypertensive participants with diabetes, heart failure or chronic kidney disease.;Long Term: Maintenance of blood pressure at goal levels.    Lipids Yes    Intervention Provide education and support for participant on nutrition & aerobic/resistive exercise along with prescribed medications to achieve LDL 70mg , HDL >40mg .    Expected Outcomes Short Term: Participant states understanding of desired cholesterol values and is compliant with medications prescribed. Participant is following exercise prescription and nutrition guidelines.;Long Term: Cholesterol controlled with medications  as prescribed, with individualized exercise RX and with personalized nutrition plan. Value goals: LDL < 70mg , HDL > 40 mg.             Education:Diabetes - Individual verbal and written instruction to review signs/symptoms of diabetes, desired ranges of glucose level fasting, after meals and with exercise. Acknowledge that pre and post exercise glucose checks will be done for 3 sessions at entry of program. Flowsheet Row Cardiac Rehab from 03/12/2022 in [facility] Cardiac and Pulmonary Rehab  Date 02/05/22  Educator [facility]  Instruction Review Code 1- Verbalizes Understanding       Core Components/Risk Factors/Patient Goals Review:   Goals and Risk Factor Review     Row Name 03/10/22 1144 03/10/22 1147           Core Components/Risk Factors/Patient Goals Review   Personal Goals Review Diabetes;Hypertension  Diabetes;Hypertension      Review Sidney reports that her BG and BP was much more elevated since her hospital admission. She started taking her long acting insulin at night instead of during the day as it was giving her restless legs- encouraged her to let her doctor know - she sees her MD 11/31/23, advised contacting office before then to let them know. Her BG has been over 300 most mornings - discussed nutrition with RD, working on changes to diet. Beautifull reports her BP has been around 150/80 at rest which is higher than her normal - she reports her MD wanted her to start back on [place], but did not give her a prescription - encouraged her to reach back out to her cardiologist regarding this. --      Expected Outcomes ST: contact MD regarding insulin switch and follow up regarding [place] LT: manage risk factors --               Core Components/Risk Factors/Patient Goals at Discharge (Final Review):   Goals and Risk Factor Review - 03/10/22 1147       Core Components/Risk Factors/Patient Goals Review   Personal Goals Review Diabetes;Hypertension             ITP Comments:  ITP Comments     Row Name 02/05/22 1453 02/24/22 0947 02/25/22 0946 03/03/22 1149 03/25/22 0813   ITP Comments Initial phone call completed. Diagnosis can be found in [facility] 9/11. EP Orientation scheduled for Monday 10/23 at 1:30. Completed 11/23 and gym orientation. Initial ITP created and sent for review to Dr. , Medical Director. 30 Day review completed. Medical Director ITP review done, changes made as directed, and signed approval by Medical Director.   NEW TO PROGRAM First full day of exercise!  Patient was oriented to gym and equipment including functions, settings, policies, and procedures.  Patient's individual exercise prescription and treatment plan were reviewed.  All starting workloads were established based on the results of the 6 minute walk test done at initial orientation visit.  The plan for  exercise progression was also introduced and progression will be customized based on patient's performance and goals. 30 Day review completed. Medical Director ITP review done, changes made as directed, and signed approval by Medical Director.   New to program            Comments:

## 2022-03-31 ENCOUNTER — Encounter: Payer: Medicaid Other | Attending: Internal Medicine | Admitting: *Deleted

## 2022-03-31 DIAGNOSIS — Z48812 Encounter for surgical aftercare following surgery on the circulatory system: Secondary | ICD-10-CM | POA: Diagnosis not present

## 2022-03-31 DIAGNOSIS — Z952 Presence of prosthetic heart valve: Secondary | ICD-10-CM | POA: Diagnosis present

## 2022-03-31 NOTE — Progress Notes (Signed)
Daily Session Note  Patient Details  Name: Nancy [name] MRN: [id number] Date of Birth: 1960/08/09 Referring Provider:   Flowsheet Row Cardiac [name] from 02/24/2022 in [facility] Cardiac and Pulmonary [name]  Referring Provider Serafina Royals MD       Encounter Date: 03/31/2022  Check In:  Session Check In - 03/31/22 1121       Check-In   Supervising physician immediately available to respond to emergencies See telemetry face sheet for immediately available ER MD    Location [facility]-Cardiac & Pulmonary [name]    Staff Present Antionette Fairy, BS, Exercise Physiologist;Jessica Kellogg, MA, RCEP, CCRP, CCET;Sahaj Bona Sherryll Burger, RN BSN    Virtual Visit No    Medication changes reported     No    Fall or balance concerns reported    No    Warm-up and Cool-down Performed on first and last piece of equipment    Resistance Training Performed Yes    VAD Patient? No    PAD/SET Patient? No      Pain Assessment   Currently in Pain? No/denies                Social History   Tobacco Use  Smoking Status Former   Packs/day: 0.25   Years: 13.00   Total pack years: 3.25   Types: Cigarettes  Smokeless Tobacco Never  Tobacco Comments   age [age]    Goals Met:  Independence with exercise equipment Exercise tolerated well No report of concerns or symptoms today Strength training completed today  Goals Unmet:  Not Applicable  Comments: Pt able to follow exercise prescription today without complaint.  Will continue to monitor for progression.    Dr. Emily Filbert is Medical Director for [place].  Dr. Ottie Glazier is Medical Director for [facility] Pulmonary Rehabilitation.

## 2022-04-02 ENCOUNTER — Encounter: Payer: Medicaid Other | Admitting: *Deleted

## 2022-04-02 DIAGNOSIS — Z952 Presence of prosthetic heart valve: Secondary | ICD-10-CM | POA: Diagnosis not present

## 2022-04-02 NOTE — Progress Notes (Signed)
Daily Session Note  Patient Details  Name: Nancy [name] MRN: [id number] Date of Birth: 09/19/1960 Referring Provider:   Flowsheet Row Cardiac Rehab from 02/24/2022 in [facility] Cardiac and Pulmonary Rehab  Referring Provider Bruce Kowalski MD       Encounter Date: 04/02/2022  Check In:  Session Check In - 04/02/22 1040       Check-In   Supervising physician immediately available to respond to emergencies See telemetry face sheet for immediately available ER MD    Location [facility]-Cardiac & Pulmonary Rehab    Staff Present Jessica Hawkins, MA, RCEP, CCRP, CCET;Meredith Craven, RN BSN;Kara Williamson, MS, ACSM CEP, Exercise Physiologist    Virtual Visit No    Medication changes reported     No    Fall or balance concerns reported    No    Warm-up and Cool-down Performed on first and last piece of equipment    Resistance Training Performed Yes    VAD Patient? No    PAD/SET Patient? No      Pain Assessment   Currently in Pain? No/denies                Social History   Tobacco Use  Smoking Status Former   Packs/day: 0.25   Years: 13.00   Total pack years: 3.25   Types: Cigarettes  Smokeless Tobacco Never  Tobacco Comments   age [age]    Goals Met:  Independence with exercise equipment Exercise tolerated well No report of concerns or symptoms today Strength training completed today  Goals Unmet:  Not Applicable  Comments: Pt able to follow exercise prescription today without complaint.  Will continue to monitor for progression.    Dr. Mark Miller is Medical Director for [facility].  Dr. Fuad Aleskerov is Medical Director for [facility] Pulmonary Rehabilitation. 

## 2022-04-07 ENCOUNTER — Encounter: Payer: Medicaid Other | Admitting: *Deleted

## 2022-04-07 DIAGNOSIS — Z952 Presence of prosthetic heart valve: Secondary | ICD-10-CM | POA: Diagnosis not present

## 2022-04-07 NOTE — Progress Notes (Signed)
Daily Session Note  Patient Details  Name: Nancy [name] MRN: [id number] Date of Birth: [birth year] Referring Provider:   Flowsheet Row Cardiac Rehab from 02/24/2022 in [facility] Cardiac and Pulmonary Rehab  Referring Provider Serafina Royals MD       Encounter Date: 04/07/2022  Check In:  Session Check In - 04/07/22 1059       Check-In   Supervising physician immediately available to respond to emergencies See telemetry face sheet for immediately available ER MD    Location [facility]-Cardiac & Pulmonary Rehab    Staff Present Renita Papa, RN BSN;Jessica Luan Pulling, MA, RCEP, CCRP, CCET;Noah Tickle, BS, Exercise Physiologist;Kara Maricela Bo, MS, ACSM CEP, Exercise Physiologist    Virtual Visit No    Medication changes reported     Yes    Comments increased warfarin twice a week    Fall or balance concerns reported    No    Tobacco Cessation No Change    Warm-up and Cool-down Performed on first and last piece of equipment    Resistance Training Performed Yes    VAD Patient? No    PAD/SET Patient? No      Pain Assessment   Currently in Pain? No/denies                Social History   Tobacco Use  Smoking Status Former   Packs/day: 0.25   Years: 13.00   Total pack years: 3.25   Types: Cigarettes  Smokeless Tobacco Never  Tobacco Comments   age [age]    Goals Met:  Independence with exercise equipment Exercise tolerated well No report of concerns or symptoms today Strength training completed today  Goals Unmet:  Not Applicable  Comments: Pt able to follow exercise prescription today without complaint.  Will continue to monitor for progression.    Dr. Emily Filbert is Medical Director for [place].  Dr. Ottie Glazier is Medical Director for [facility] Pulmonary Rehabilitation.

## 2022-04-09 ENCOUNTER — Encounter: Payer: Medicaid Other | Admitting: *Deleted

## 2022-04-09 DIAGNOSIS — Z952 Presence of prosthetic heart valve: Secondary | ICD-10-CM | POA: Diagnosis not present

## 2022-04-09 NOTE — Progress Notes (Signed)
Daily Session Note  Patient Details  Name: Nancy [name] MRN: [id number] Date of Birth: 09-10-1960 Referring Provider:   Flowsheet Row Cardiac Rehab from 02/24/2022 in [facility] Cardiac and Pulmonary Rehab  Referring Provider Serafina Royals MD       Encounter Date: 04/09/2022  Check In:  Session Check In - 04/09/22 1110       Check-In   Supervising physician immediately available to respond to emergencies See telemetry face sheet for immediately available ER MD    Location [facility]-Cardiac & Pulmonary Rehab    Staff Present Darlyne Russian, RN, ADN;Meredith Sherryll Burger, RN Abel Presto, MS, ACSM CEP, Exercise Physiologist;Jessica Luan Pulling, MA, RCEP, CCRP, CCET    Virtual Visit No    Medication changes reported     No    Fall or balance concerns reported    No    Warm-up and Cool-down Performed on first and last piece of equipment    Resistance Training Performed Yes    VAD Patient? No    PAD/SET Patient? No      Pain Assessment   Currently in Pain? No/denies                Social History   Tobacco Use  Smoking Status Former   Packs/day: 0.25   Years: 13.00   Total pack years: 3.25   Types: Cigarettes  Smokeless Tobacco Never  Tobacco Comments   age [age]    Goals Met:  Independence with exercise equipment Exercise tolerated well No report of concerns or symptoms today Strength training completed today  Goals Unmet:  Not Applicable  Comments: Pt able to follow exercise prescription today without complaint.  Will continue to monitor for progression.    Dr. Emily Filbert is Medical Director for [place].  Dr. Ottie Glazier is Medical Director for [facility] Pulmonary Rehabilitation.

## 2022-04-14 ENCOUNTER — Encounter: Payer: Medicaid Other | Admitting: *Deleted

## 2022-04-14 DIAGNOSIS — Z952 Presence of prosthetic heart valve: Secondary | ICD-10-CM | POA: Diagnosis not present

## 2022-04-14 NOTE — Progress Notes (Signed)
Daily Session Note  Patient Details  Name: Nancy [name] MRN: [id number] Date of Birth: 1960-10-02 Referring Provider:   Flowsheet Row Cardiac Rehab from 02/24/2022 in [facility] Cardiac and Pulmonary Rehab  Referring Provider Serafina Royals MD       Encounter Date: 04/14/2022  Check In:  Session Check In - 04/14/22 1103       Check-In   Supervising physician immediately available to respond to emergencies See telemetry face sheet for immediately available ER MD    Location [facility]-Cardiac & Pulmonary Rehab    Staff Present Renita Papa, RN BSN;Jessica Luan Pulling, MA, RCEP, CCRP, Bertram Gala, MS, ACSM CEP, Exercise Physiologist    Virtual Visit No    Medication changes reported     No    Fall or balance concerns reported    No    Warm-up and Cool-down Performed on first and last piece of equipment    Resistance Training Performed Yes    VAD Patient? No    PAD/SET Patient? No      Pain Assessment   Currently in Pain? No/denies                Social History   Tobacco Use  Smoking Status Former   Packs/day: 0.25   Years: 13.00   Total pack years: 3.25   Types: Cigarettes  Smokeless Tobacco Never  Tobacco Comments   age [age]    Goals Met:  Independence with exercise equipment Exercise tolerated well No report of concerns or symptoms today Strength training completed today  Goals Unmet:  Not Applicable  Comments: Pt able to follow exercise prescription today without complaint.  Will continue to monitor for progression.    Dr. Emily Filbert is Medical Director for [place].  Dr. Ottie Glazier is Medical Director for [facility] Pulmonary Rehabilitation.

## 2022-04-16 ENCOUNTER — Encounter: Payer: Medicaid Other | Admitting: *Deleted

## 2022-04-16 DIAGNOSIS — Z952 Presence of prosthetic heart valve: Secondary | ICD-10-CM | POA: Diagnosis not present

## 2022-04-16 NOTE — Progress Notes (Signed)
Daily Session Note  Patient Details  Name: Nancy [name] MRN: [id number] Date of Birth: 05-28-1960 Referring Provider:   Flowsheet Row Cardiac Rehab from 02/24/2022 in [facility]  Referring Provider Serafina Royals MD       Encounter Date: 04/16/2022  Check In:  Session Check In - 04/16/22 1119       Check-In   Supervising physician immediately available to respond to emergencies See telemetry face sheet for immediately available ER MD    Location [facility]-Cardiac & Pul[facility]    Staff Present Renita Papa, RN BSN;Joseph Tessie Fass, RCP,RRT,BSRT;Noah Franklin, [place], Exercise Physiologist    Virtual Visit No    Medication changes reported     No    Fall or balance concerns reported    No    Warm-up and Cool-down Performed on first and last piece of equipment    Resistance Training Performed Yes    VAD Patient? No    PAD/SET Patient? No      Pain Assessment   Currently in Pain? No/denies                Social History   Tobacco Use  Smoking Status Former   Packs/day: 0.25   Years: 13.00   Total pack years: 3.25   Types: Cigarettes  Smokeless Tobacco Never  Tobacco Comments   age [age]    Goals Met:  Independence with exercise equipment Exercise tolerated well No report of concerns or symptoms today Strength training completed today  Goals Unmet:  Not Applicable  Comments: Pt able to follow exercise prescription today without complaint.  Will continue to monitor for progression.    Dr. Emily Filbert is Medical Director for [place].  Dr. Ottie Glazier is Medical Director for [facility].

## 2022-04-21 ENCOUNTER — Emergency Department
Admission: EM | Admit: 2022-04-21 | Discharge: 2022-04-21 | Discharge status: Against Medical Advice | Payer: Medicaid Other | Attending: Emergency Medicine | Admitting: Emergency Medicine

## 2022-04-21 ENCOUNTER — Emergency Department: Payer: Medicaid Other

## 2022-04-21 DIAGNOSIS — Z5321 Procedure and treatment not carried out due to patient leaving prior to being seen by health care provider: Secondary | ICD-10-CM | POA: Diagnosis not present

## 2022-04-21 DIAGNOSIS — R109 Unspecified abdominal pain: Secondary | ICD-10-CM | POA: Insufficient documentation

## 2022-04-21 DIAGNOSIS — R319 Hematuria, unspecified: Secondary | ICD-10-CM | POA: Insufficient documentation

## 2022-04-21 DIAGNOSIS — Z7901 Long term (current) use of anticoagulants: Secondary | ICD-10-CM | POA: Diagnosis not present

## 2022-04-21 NOTE — ED Provider Triage Note (Signed)
  Emergency Medicine Provider Triage Evaluation Note  Nancy [name] , a 61 y.o.female,  was evaluated in triage.  Pt complains of right-sided flank pain and hematuria.  She states that she was recently placed on warfarin.  She has been experiencing right-sided flank pain for the past 3 days.   Review of Systems  Positive: Hematuria, right-sided flank pain. Negative: Denies fever, chest pain, vomiting  Physical Exam  There were no vitals filed for this visit. Gen:   Awake, no distress   Resp:  Normal effort  MSK:   Moves extremities without difficulty  Other:    Medical Decision Making  Given the patient's initial medical screening exam, the following diagnostic evaluation has been ordered. The patient will be placed in the appropriate treatment space, once one is available, to complete the evaluation and treatment. I have discussed the plan of care with the patient and I have advised the patient that an ED physician or mid-level practitioner will reevaluate their condition after the test results have been received, as the results may give them additional insight into the type of treatment they may need.    Diagnostics: Labs, CT renal  Treatments: none immediately   Varney Daily, [place] 04/21/22 [phone]

## 2022-04-22 ENCOUNTER — Encounter: Payer: Self-pay | Admitting: *Deleted

## 2022-04-22 DIAGNOSIS — Z952 Presence of prosthetic heart valve: Secondary | ICD-10-CM

## 2022-04-22 NOTE — Progress Notes (Signed)
Cardiac Individual Treatment Plan  Patient Details  Name: Nancy [name] MRN: [id number] Date of Birth: [birth year] Referring Provider:   Flowsheet Row Cardiac Rehab from 02/24/2022 in [facility] Cardiac and Pulmonary Rehab  Referring Provider Serafina Royals MD       Initial Encounter Date:  Flowsheet Row Cardiac Rehab from 02/24/2022 in [facility] Cardiac and Pulmonary Rehab  Date 02/24/22       Visit Diagnosis: S/P AVR (aortic valve replacement)  Patient's Home Medications on Admission:  Current Outpatient Medications:    acetaminophen-codeine (TYLENOL #3) 300-30 MG tablet, Take 2 tablets by mouth at bedtime., Disp: , Rfl: 0   albuterol (PROVENTIL HFA;VENTOLIN HFA) 108 (90 Base) MCG/ACT inhaler, Inhale 2 puffs into the lungs every 6 (six) hours as needed for wheezing or shortness of breath., Disp: , Rfl:    amLODipine (NORVASC) 10 MG tablet, Take 10 mg by mouth every morning. , Disp: , Rfl: 5   aspirin EC 81 MG tablet, Take 81 mg by mouth daily., Disp: , Rfl:    CINNAMON PO, Take 2 capsules by mouth 2 (two) times daily., Disp: , Rfl:    diclofenac sodium (VOLTAREN) 1 % GEL, Apply 1 application topically daily as needed (for pain)., Disp: , Rfl:    furosemide (LASIX) 40 MG tablet, Take 1 tablet (40 mg total) by mouth daily., Disp: 30 tablet, Rfl: 11   gabapentin (NEURONTIN) 300 MG capsule, Take 300 mg by mouth at bedtime., Disp: , Rfl: 3   gabapentin (NEURONTIN) 300 MG capsule, Take 1 capsule (300 mg total) by mouth 3 (three) times daily for 21 days., Disp: 63 capsule, Rfl: 0   GARCINIA CAMBOGIA-CHROMIUM PO, Take 2 capsules by mouth daily., Disp: , Rfl:    JARDIANCE 25 MG TABS tablet, Take 25 mg by mouth daily., Disp: , Rfl:    LANTUS SOLOSTAR 100 UNIT/ML Solostar Pen, Inject 38 Units into the skin at bedtime., Disp: , Rfl: 3   losartan (COZAAR) 100 MG tablet, Take 100 mg by mouth every morning. , Disp: , Rfl: 5   metoprolol succinate (TOPROL-XL) 50 MG 24 hr tablet, Take 50 mg by mouth  every morning. , Disp: , Rfl: 2   montelukast (SINGULAIR) 10 MG tablet, Take 10 mg by mouth daily., Disp: , Rfl:    nystatin cream (MYCOSTATIN), Apply topically., Disp: , Rfl:    OZEMPIC, 2 MG/DOSE, 8 MG/3ML SOPN, Inject into the skin., Disp: , Rfl:    Probiotic Product (PROBIOTIC PO), Take 1 capsule by mouth daily., Disp: , Rfl:    SILVADENE 1 % cream, SMARTSIG:sparingly Topical Twice Daily PRN, Disp: , Rfl:    SYMBICORT 160-4.5 MCG/ACT inhaler, Inhale 2 puffs into the lungs 2 (two) times daily as needed., Disp: , Rfl: 5   traZODone (DESYREL) 50 MG tablet, Take 0.5 tablets (25 mg total) by mouth at bedtime as needed for sleep., Disp: 30 tablet, Rfl: 0   TURMERIC PO, Take 1 capsule by mouth daily., Disp: , Rfl:   Past Medical History: Past Medical History:  Diagnosis Date   Anxiety    Arthritis    Asthma    Depression    Diabetes mellitus without complication (HCC)    GERD (gastroesophageal reflux disease)    no meds   Heart murmur    History of kidney stones    h/o   Hypertension    Myocardial infarction ([place]) 2015   Pneumonia 2014   Seizures ([place])    as a child-no since age 26  Severe aortic stenosis     Tobacco Use: Social History   Tobacco Use  Smoking Status Former   Packs/day: 0.25   Years: 63.00   Total pack years: 3.25   Types: Cigarettes  Smokeless Tobacco Never  Tobacco Comments   age [age]    Labs: Review Flowsheet       Latest Ref Rng & Units 01/03/2022  Labs for ITP Cardiac and Pulmonary Rehab  Hemoglobin A1c 4.8 - 5.6 % 8.4      Exercise Target Goals: Exercise Program Goal: Individual exercise prescription set using results from initial 6 min walk test and THRR while considering  patient's activity barriers and safety.   Exercise Prescription Goal: Initial exercise prescription builds to 30-45 minutes a day of aerobic activity, 2-3 days per week.  Home exercise guidelines will be given to patient during program as part of exercise prescription  that the participant will acknowledge.   Education: Aerobic Exercise: - Group verbal and visual presentation on the components of exercise prescription. Introduces F.I.T.T principle from ACSM for exercise prescriptions.  Reviews F.I.T.T. principles of aerobic exercise including progression. Written material given at graduation. Flowsheet Row Cardiac Rehab from 04/16/2022 in [facility] Cardiac and Pulmonary Rehab  Education need identified 02/24/22  Date 03/05/22  Educator [facility]  Instruction Review Code 1- Verbalizes Understanding       Education: Resistance Exercise: - Group verbal and visual presentation on the components of exercise prescription. Introduces F.I.T.T principle from ACSM for exercise prescriptions  Reviews F.I.T.T. principles of resistance exercise including progression. Written material given at graduation.    Education: Exercise & Equipment Safety: - Individual verbal instruction and demonstration of equipment use and safety with use of the equipment. Flowsheet Row Cardiac Rehab from 04/16/2022 in [facility] Cardiac and Pulmonary Rehab  Date 02/24/22  Educator NT  Instruction Review Code 1- Verbalizes Understanding       Education: Exercise Physiology & General Exercise Guidelines: - Group verbal and written instruction with models to review the exercise physiology of the cardiovascular system and associated critical values. Provides general exercise guidelines with specific guidelines to those with heart or lung disease.    Education: Flexibility, Balance, Mind/Body Relaxation: - Group verbal and visual presentation with interactive activity on the components of exercise prescription. Introduces F.I.T.T principle from ACSM for exercise prescriptions. Reviews F.I.T.T. principles of flexibility and balance exercise training including progression. Also discusses the mind body connection.  Reviews various relaxation techniques to help reduce and manage stress (i.e. Deep breathing,  progressive muscle relaxation, and visualization). Balance handout provided to take home. Written material given at graduation.   Activity Barriers & Risk Stratification:  Activity Barriers & Cardiac Risk Stratification - 02/24/22 1008       Activity Barriers & Cardiac Risk Stratification   Activity Barriers Arthritis;Other (comment);Joint Problems    Comments right foot- broken toe, Hip Pain    Cardiac Risk Stratification Moderate             6 Minute Walk:  6 Minute Walk     Row Name 02/24/22 1006         6 Minute Walk   Phase Initial     Distance 1085 feet     Walk Time 6 minutes     # of Rest Breaks 0     MPH 2.05     METS 2.82     RPE 7     Perceived Dyspnea  0     VO2 Peak 9.87  Symptoms Yes (comment)     Comments Hip Pain 8/10     Resting HR 90 bpm     Resting BP 142/78     Resting Oxygen Saturation  97 %     Exercise Oxygen Saturation  during 6 min walk 95 %     Max Ex. HR 107 bpm     Max Ex. BP 144/84     2 Minute Post BP 140/78              Oxygen Initial Assessment:   Oxygen Re-Evaluation:   Oxygen Discharge (Final Oxygen Re-Evaluation):   Initial Exercise Prescription:  Initial Exercise Prescription - 02/24/22 1000       Date of Initial Exercise RX and Referring Provider   Date 02/24/22    Referring Provider Serafina Royals MD      Oxygen   Maintain Oxygen Saturation 88% or higher      Treadmill   MPH 1.8    Grade 0.5    Minutes 15    METs 2.5      NuStep   Level 2    SPM 80    Minutes 15    METs 2.82      Recumbant Elliptical   Level 1    RPM 50    Watts 23    Minutes 15    METs 2.82      T5 Nustep   Level 1    SPM 80    Minutes 15    METs 2.82      Prescription Details   Frequency (times per week) 2    Duration Progress to 30 minutes of continuous aerobic without signs/symptoms of physical distress      Intensity   THRR 40-80% of Max Heartrate 117-144    Ratings of Perceived Exertion 11-13     Perceived Dyspnea 0-4      Progression   Progression Continue to progress workloads to maintain intensity without signs/symptoms of physical distress.      Resistance Training   Training Prescription Yes    Weight 3 lb    Reps 10-15             Perform Capillary Blood Glucose checks as needed.  Exercise Prescription Changes:   Exercise Prescription Changes     Row Name 02/24/22 1000 03/17/22 1500 03/25/22 1100 04/01/22 1300 04/15/22 1300     Response to Exercise   Blood Pressure (Admit) 142/78 132/88 -- 132/62 126/64   Blood Pressure (Exercise) 144/84 158/82 -- 166/78 162/84   Blood Pressure (Exit) 140/78 128/80 -- 122/64 126/62   Heart Rate (Admit) 90 bpm 91 bpm -- 90 bpm 91 bpm   Heart Rate (Exercise) 107 bpm 105 bpm -- 108 bpm 113 bpm   Heart Rate (Exit) 95 bpm 92 bpm -- 93 bpm 98 bpm   Oxygen Saturation (Admit) 97 % -- -- -- --   Oxygen Saturation (Exercise) 95 % -- -- -- --   Rating of Perceived Exertion (Exercise) 7 12 -- 12 15   Perceived Dyspnea (Exercise) 0 -- -- -- --   Symptoms Hip Pain 8/10 calf pain -- none none   Comments 6MWT Results First four rehab sessions -- -- --   Duration -- Progress to 30 minutes of  aerobic without signs/symptoms of physical distress -- Continue with 30 min of aerobic exercise without signs/symptoms of physical distress. Continue with 30 min of aerobic exercise without signs/symptoms of physical distress.   Intensity --  THRR unchanged -- THRR unchanged THRR unchanged     Progression   Progression -- Continue to progress workloads to maintain intensity without signs/symptoms of physical distress. -- Continue to progress workloads to maintain intensity without signs/symptoms of physical distress. Continue to progress workloads to maintain intensity without signs/symptoms of physical distress.   Average METs -- 2.67 -- 2.38 2.3     Resistance Training   Training Prescription -- Yes -- Yes Yes   Weight -- 3 lb -- 3 lb 3 lb   Reps --  10-15 -- 10-15 10-15     Interval Training   Interval Training -- No -- -- No     Treadmill   MPH -- 2 -- 2 2.1   Grade -- 0.5 -- 0.5 1   Minutes -- 15 -- 15 15   METs -- 2.67 -- 2.67 2.9     NuStep   Level -- 6 -- -- 6   Minutes -- 15 -- -- 15   METs -- 5 -- -- 2     Recumbant Elliptical   Level -- 3 -- 3 1   Minutes -- 15 -- 15 15   METs -- 2.6 -- 2.6 1.7     REL-XR   Level -- -- -- 4 --   Minutes -- -- -- 15 --     Home Exercise Plan   Plans to continue exercise at -- -- Home (comment)  walking Home (comment)  walking Home (comment)  walking   Frequency -- -- Add 3 additional days to program exercise sessions. Add 3 additional days to program exercise sessions. Add 3 additional days to program exercise sessions.   Initial Home Exercises Provided -- -- 03/25/22 03/25/22 03/25/22     Oxygen   Maintain Oxygen Saturation -- 88% or higher 88% or higher 88% or higher 88% or higher            Exercise Comments:   Exercise Comments     Row Name 03/03/22 1149           Exercise Comments First full day of exercise!  Patient was oriented to gym and equipment including functions, settings, policies, and procedures.  Patient's individual exercise prescription and treatment plan were reviewed.  All starting workloads were established based on the results of the 6 minute walk test done at initial orientation visit.  The plan for exercise progression was also introduced and progression will be customized based on patient's performance and goals.                Exercise Goals and Review:   Exercise Goals     Row Name 02/24/22 1023             Exercise Goals   Increase Physical Activity Yes       Intervention Provide advice, education, support and counseling about physical activity/exercise needs.;Develop an individualized exercise prescription for aerobic and resistive training based on initial evaluation findings, risk stratification, comorbidities and  participant's personal goals.       Expected Outcomes Short Term: Attend rehab on a regular basis to increase amount of physical activity.;Long Term: Add in home exercise to make exercise part of routine and to increase amount of physical activity.;Long Term: Exercising regularly at least 3-5 days a week.       Increase Strength and Stamina Yes       Intervention Provide advice, education, support and counseling about physical activity/exercise needs.;Develop an individualized exercise prescription for aerobic  and resistive training based on initial evaluation findings, risk stratification, comorbidities and participant's personal goals.       Expected Outcomes Short Term: Increase workloads from initial exercise prescription for resistance, speed, and METs.;Short Term: Perform resistance training exercises routinely during rehab and add in resistance training at home;Long Term: Improve cardiorespiratory fitness, muscular endurance and strength as measured by increased METs and functional capacity (6MWT)       Able to understand and use rate of perceived exertion (RPE) scale Yes       Intervention Provide education and explanation on how to use RPE scale       Expected Outcomes Short Term: Able to use RPE daily in rehab to express subjective intensity level;Long Term:  Able to use RPE to guide intensity level when exercising independently       Able to understand and use Dyspnea scale Yes       Intervention Provide education and explanation on how to use Dyspnea scale       Expected Outcomes Short Term: Able to use Dyspnea scale daily in rehab to express subjective sense of shortness of breath during exertion;Long Term: Able to use Dyspnea scale to guide intensity level when exercising independently       Knowledge and understanding of Target Heart Rate Range (THRR) Yes       Intervention Provide education and explanation of THRR including how the numbers were predicted and where they are located for  reference       Expected Outcomes Short Term: Able to state/look up THRR;Long Term: Able to use THRR to govern intensity when exercising independently;Short Term: Able to use daily as guideline for intensity in rehab       Able to check pulse independently Yes       Intervention Provide education and demonstration on how to check pulse in carotid and radial arteries.;Review the importance of being able to check your own pulse for safety during independent exercise       Expected Outcomes Short Term: Able to explain why pulse checking is important during independent exercise;Long Term: Able to check pulse independently and accurately       Understanding of Exercise Prescription Yes       Intervention Provide education, explanation, and written materials on patient's individual exercise prescription       Expected Outcomes Short Term: Able to explain program exercise prescription;Long Term: Able to explain home exercise prescription to exercise independently                Exercise Goals Re-Evaluation :  Exercise Goals Re-Evaluation     Row Name 02/24/22 1025 03/03/22 1149 03/10/22 1141 03/17/22 1521 03/25/22 1115     Exercise Goal Re-Evaluation   Exercise Goals Review -- Knowledge and understanding of Target Heart Rate Range (THRR);Able to understand and use rate of perceived exertion (RPE) scale;Understanding of Exercise Prescription;Able to understand and use Dyspnea scale Knowledge and understanding of Target Heart Rate Range (THRR);Able to understand and use rate of perceived exertion (RPE) scale;Understanding of Exercise Prescription;Able to understand and use Dyspnea scale Understanding of Exercise Prescription;Increase Physical Activity;Increase Strength and Stamina Understanding of Exercise Prescription;Increase Physical Activity;Increase Strength and Stamina   Comments -- Reviewed RPE scale, THR and program prescription with pt today.  Pt voiced understanding and was given a copy of  goals to take home. She reports not walking as much outside due to the colder weather. She has thought about getting a stepper for thier home. EP  has not gone over home exercise yet. Rodolfo is doing well in rehab. She averaged 2.67 METs during her first three rehab sessions. She also was able to increase her speed on the treadmill to 2 mph. She improved to level 3 on the REL and level 6 on the T4 as well. We will continue to monitor her progress in the program. Reviewed home exercise with pt today.  Pt plans to walk for exercise. She is not interested in joining a gym. Patient currently walks outside for about 30 minutes at one time, which include variations in elevation. Now that it is getting colder outside, she was encouraged to keep walking indoors, perhaps at a grocery store like [employer], [employer], etc. She has a BP cuff that checks her HR but is interested in purchasing a pulse ox. Reviewed THR, pulse, RPE, sign and symptoms, pulse oximetery and when to call 911 or MD.  Also discussed weather considerations and indoor options.  Pt voiced understanding.   Expected Outcomes -- Short: Use RPE daily to regulate intensity. Long: Follow program prescription in THR. Short: EP to go over home exercise. Long: Follow program prescription in THR. Short: Continue to increase workloads as tolerated. Long: Continue to increase strength and stamina. Short: Start checking HR during walks Long: Continue to exercise independently at appropriate prescription    Row Name 03/31/22 1110 04/01/22 1316 04/15/22 1324         Exercise Goal Re-Evaluation   Exercise Goals Review -- Understanding of Exercise Prescription;Increase Physical Activity;Increase Strength and Stamina Understanding of Exercise Prescription;Increase Physical Activity;Increase Strength and Stamina     Comments Dionna reports not being able to start home exercise yet asie from some walking. She has weights and our youtube channel that she would like to check  out. Encouraged her to begin incorporating home exercise. Peggye continues to do well in rehab. She has increased to level 4 on the XR! She is not quite hitting her THR but her RPES are in appropriate range. She could benefit from increasing her incline on the treadmill. Will continue to monitor. Marliyah is doing well in rehab. She has consistently kept her overall average MET level above 2.3 METs. She also was able to increase her workload on the treadmill to a speed of 2.1 mph and an incline of 1%. She has also done well with 3 lb hand weights for resistance training, and may benefit from trying 4 lbs. We will continue to monitor her progress.     Expected Outcomes -- Short: Increase incline on TM to 1% Long: Continue to increase overall MET level Short: Try 4 lb hand weights. Long: Continue to improve strength and stamina.              Discharge Exercise Prescription (Final Exercise Prescription Changes):  Exercise Prescription Changes - 04/15/22 1300       Response to Exercise   Blood Pressure (Admit) 126/64    Blood Pressure (Exercise) 162/84    Blood Pressure (Exit) 126/62    Heart Rate (Admit) 91 bpm    Heart Rate (Exercise) 113 bpm    Heart Rate (Exit) 98 bpm    Rating of Perceived Exertion (Exercise) 15    Symptoms none    Duration Continue with 30 min of aerobic exercise without signs/symptoms of physical distress.    Intensity THRR unchanged      Progression   Progression Continue to progress workloads to maintain intensity without signs/symptoms of physical distress.  Average METs 2.3      Resistance Training   Training Prescription Yes    Weight 3 lb    Reps 10-15      Interval Training   Interval Training No      Treadmill   MPH 2.1    Grade 1    Minutes 15    METs 2.9      NuStep   Level 6    Minutes 15    METs 2      Recumbant Elliptical   Level 1    Minutes 15    METs 1.7      Home Exercise Plan   Plans to continue exercise at Home (comment)    walking   Frequency Add 3 additional days to program exercise sessions.    Initial Home Exercises Provided 03/25/22      Oxygen   Maintain Oxygen Saturation 88% or higher             Nutrition:  Target Goals: Understanding of nutrition guidelines, daily intake of sodium <1555m, cholesterol <2060m calories 30% from fat and 7% or less from saturated fats, daily to have 5 or more servings of fruits and vegetables.  Education: All About Nutrition: -Group instruction provided by verbal, written material, interactive activities, discussions, models, and posters to present general guidelines for heart healthy nutrition including fat, fiber, MyPlate, the role of sodium in heart healthy nutrition, utilization of the nutrition label, and utilization of this knowledge for meal planning. Follow up email sent as well. Written material given at graduation. [facility]  Education need identified 02/24/22  Date 03/12/22  Educator MCPembrokeInstruction Review Code 1- Verbalizes Understanding       Biometrics:  Pre Biometrics - 02/24/22 1026       Pre Biometrics   Height 5' 4.25" (1.632 m)    Weight 189 lb 12.8 oz (86.1 kg)    Waist Circumference 45 inches    Hip Circumference 45.5 inches    Waist to Hip Ratio 0.99 %    BMI (Calculated) 32.32    Single Leg Stand 14.6 seconds              Nutrition Therapy Plan and Nutrition Goals:  Nutrition Therapy & Goals - 02/24/22 0958       Nutrition Therapy   Diet Heart healthy, low Na, T2DM MNT    Drug/Food Interactions Coumadin/Vit K    Protein (specify units) 75-85g    Fiber 28 grams    Whole Grain Foods 3 servings    Saturated Fats 15 max. grams    Fruits and Vegetables 8 servings/day    Sodium 2 grams      Personal Nutrition Goals   Nutrition Goal ST: switch to whole wheat bread, include fruits/vegetables at most meals using MyPlate as a guide, eat small and  frequent meals with consistent CHO, eat consistent amounts of vitamin K daily LT: limit saturated fat <15g, achieve and maintain A1C <7, eat at least 28g of fiber per day, manage INR within recommended range by MD    Comments [age].o. F admitted to cardiac rehab s/p AVR. PMHx inlcudes diverticulosis, T2DM, former smoker, HTN, HLD, MI, CHF. Relevant medications includes coumadin, lantus, probiotic, ozempic. Recent A1C 01/05/22: 8.5. Carollyn reports having a green house and garden, she reports they eat of a lot of fish as they go fishing often. Food recall includes: B: grits or oatmeal  with eggs and bacon L: pimento cheese sandwich (honey-wheat) D: pizza, stirfry. Fish, chicken (baked) with skin (air frier), shrimp - olive oil, canola oil. or cereal (total).  S: ice cream (1 black cherry cone). Discussed T2DM MNT, ozempic meal planning (smaller more frequent meals recommended), heart healthy eating, and warfarin MNT.      Intervention Plan   Intervention Prescribe, educate and counsel regarding individualized specific dietary modifications aiming towards targeted core components such as weight, hypertension, lipid management, diabetes, heart failure and other comorbidities.;Nutrition handout(s) given to patient.    Expected Outcomes Short Term Goal: Understand basic principles of dietary content, such as calories, fat, sodium, cholesterol and nutrients.;Short Term Goal: A plan has been developed with personal nutrition goals set during dietitian appointment.;Long Term Goal: Adherence to prescribed nutrition plan.             Nutrition Assessments:  MEDIFICTS Score Key: ?70 Need to make dietary changes  40-70 Heart Healthy Diet ? 40 Therapeutic Level Cholesterol Diet  Flowsheet Row Cardiac Rehab from 02/24/2022 in [facility] Cardiac and Pulmonary Rehab  Picture Your Plate Total Score on Admission 52      Picture Your Plate Scores: <41 Unhealthy dietary pattern with much room for improvement. 41-50  Dietary pattern unlikely to meet recommendations for good health and room for improvement. 51-60 More healthful dietary pattern, with some room for improvement.  >60 Healthy dietary pattern, although there may be some specific behaviors that could be improved.    Nutrition Goals Re-Evaluation:  Nutrition Goals Re-Evaluation     [place] Name 03/10/22 1136 03/31/22 1112           Goals   Nutrition Goal ST: switch to whole wheat bread, include fruits/vegetables at most meals using MyPlate as a guide, eat small and frequent meals with consistent CHO, eat consistent amounts of vitamin K daily LT: limit saturated fat <15g, achieve and maintain A1C <7, eat at least 28g of fiber per day, manage INR within recommended range by MD ST: use MyPlate as a guide, consistent CHO and lower GI CHOs, eat consistent amounts of vitamin K daily LT: limit saturated fat <15g, achieve and maintain A1C <7, eat at least 28g of fiber per day, manage INR within recommended range by MD      Comment Telitha reports doing well with her nutrition and has been getting in more fruits/vegetables, but hasd been doing more fruit and dried fruit. She feels she may be having too much fruit, discussed pairing fruit with healthy fat and protein - she has it with nuts/seeds, but has multiple servings of fruit; discussed breaking up this snack to avoid having too mnay CHO at one time. Encouraged to continue with changes made. Pearla reports doing well with her nutrition overall, but is having a hard time getting motivated to cook meals - sometimes she will go out for grilled chicken and salad, but for the most part her husband has been cooking. discussed adding in easy vegetables like frozen vegetables to dinner and swapping to carbohydrates that wont effect her BG as much as it has been running higher since her hospital stay and being unable to get ozempic. Suggested carbohydrates like whole grains, red skin or sweet potatoes, beans/lentils more  often than white rice, white potatoes, and corn. She reports snacking on a mix of chex cereal, nuts/seeds, and caramel corn - suggested limiting sodium and caramel corn.      Expected Outcome ST: switch to whole wheat bread, include fruits/vegetables at  most meals using MyPlate as a guide, eat small and frequent meals with consistent CHO, eat consistent amounts of vitamin K daily LT: limit saturated fat <15g, achieve and maintain A1C <7, eat at least 28g of fiber per day, manage INR within recommended range by MD ST: use MyPlate as a guide, consistent CHO and lower GI CHOs, eat consistent amounts of vitamin K daily LT: limit saturated fat <15g, achieve and maintain A1C <7, eat at least 28g of fiber per day, manage INR within recommended range by MD               Nutrition Goals Discharge (Final Nutrition Goals Re-Evaluation):  Nutrition Goals Re-Evaluation - 03/31/22 1112       Goals   Nutrition Goal ST: use MyPlate as a guide, consistent CHO and lower GI CHOs, eat consistent amounts of vitamin K daily LT: limit saturated fat <15g, achieve and maintain A1C <7, eat at least 28g of fiber per day, manage INR within recommended range by MD    Comment Giuseppina reports doing well with her nutrition overall, but is having a hard time getting motivated to cook meals - sometimes she will go out for grilled chicken and salad, but for the most part her husband has been cooking. discussed adding in easy vegetables like frozen vegetables to dinner and swapping to carbohydrates that wont effect her BG as much as it has been running higher since her hospital stay and being unable to get ozempic. Suggested carbohydrates like whole grains, red skin or sweet potatoes, beans/lentils more often than white rice, white potatoes, and corn. She reports snacking on a mix of chex cereal, nuts/seeds, and caramel corn - suggested limiting sodium and caramel corn.    Expected Outcome ST: use MyPlate as a guide, consistent CHO and  lower GI CHOs, eat consistent amounts of vitamin K daily LT: limit saturated fat <15g, achieve and maintain A1C <7, eat at least 28g of fiber per day, manage INR within recommended range by MD             Psychosocial: Target Goals: Acknowledge presence or absence of significant depression and/or stress, maximize coping skills, provide positive support system. Participant is able to verbalize types and ability to use techniques and skills needed for reducing stress and depression.   Education: Stress, Anxiety, and Depression - Group verbal and visual presentation to define topics covered.  Reviews how body is impacted by stress, anxiety, and depression.  Also discusses healthy ways to reduce stress and to treat/manage anxiety and depression.  Written material given at graduation.   Education: Sleep Hygiene -Provides group verbal and written instruction about how sleep can affect your health.  Define sleep hygiene, discuss sleep cycles and impact of sleep habits. Review good sleep hygiene tips.    Initial Review & Psychosocial Screening:  Initial Psych Review & Screening - 02/05/22 1443       Initial Review   Current issues with None Identified      Family Dynamics   Good Support System? Yes   husband, sisters     Barriers   Psychosocial barriers to participate in program There are no identifiable barriers or psychosocial needs.      Screening Interventions   Interventions Encouraged to exercise;Provide feedback about the scores to participant;To provide support and resources with identified psychosocial needs    Expected Outcomes Short Term goal: Utilizing psychosocial counselor, staff and physician to assist with identification of specific Stressors or current issues interfering  with healing process. Setting desired goal for each stressor or current issue identified.;Long Term Goal: Stressors or current issues are controlled or eliminated.;Short Term goal: Identification and review  with participant of any Quality of Life or Depression concerns found by scoring the questionnaire.;Long Term goal: The participant improves quality of Life and PHQ9 Scores as seen by post scores and/or verbalization of changes             Quality of Life Scores:   Quality of Life - 02/24/22 1005       Quality of Life   Select Quality of Life      Quality of Life Scores   Health/Function Pre 14.36 %    Socioeconomic Pre 15.25 %    Psych/Spiritual Pre 15 %    Family Pre 18 %    GLOBAL Pre 15.24 %            Scores of 19 and below usually indicate a poorer quality of life in these areas.  A difference of  2-3 points is a clinically meaningful difference.  A difference of 2-3 points in the total score of the Quality of Life Index has been associated with significant improvement in overall quality of life, self-image, physical symptoms, and general health in studies assessing change in quality of life.  PHQ-9: Review Flowsheet       03/24/2022 02/24/2022  Depression screen PHQ 2/9  Decreased Interest 0 1  Down, Depressed, Hopeless 0 1  PHQ - 2 Score 0 2  Altered sleeping 1 2  Tired, decreased energy 1 1  Change in appetite 1 1  Feeling bad or failure about yourself  0 2  Trouble concentrating 1 1  Moving slowly or fidgety/restless 2 3  Suicidal thoughts 0 0  PHQ-9 Score 6 12  Difficult doing work/chores Somewhat difficult Somewhat difficult   Interpretation of Total Score  Total Score Depression Severity:  1-4 = Minimal depression, 5-9 = Mild depression, 10-14 = Moderate depression, 15-19 = Moderately severe depression, 20-27 = Severe depression   Psychosocial Evaluation and Intervention:  Psychosocial Evaluation - 02/05/22 1449       Psychosocial Evaluation & Interventions   Interventions Encouraged to exercise with the program and follow exercise prescription;Stress management education;Relaxation education    Comments Jendayi 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 She reports that there was quite a bit of pain, but it is easing off now. Her IV did infiltrate in the hospital and her arm is still sore. Her sleep habits are returning to normal. She does not express any stress concerns at this time, besided just the healing process. Her husband and sisters are her main support and he plans on coming with her to education. She wants to come to the program to learn more of what she should be doing to help her health.    Expected Outcomes Short: attend cardiac rehab for education and exericse. Long: develop and maintain positive self care habits.    Continue Psychosocial Services  Follow up required by staff             Psychosocial Re-Evaluation:  Psychosocial Re-Evaluation     [place] Name 03/10/22 1148 03/24/22 1110 03/31/22 1124         Psychosocial Re-Evaluation   Current issues with Current Stress Concerns Current Stress Concerns;Current Depression Current Stress Concerns;Current Depression     Comments Raeana reports that her husband is a good support system for her and her sister as well,  but to a lesser capacity. She reports being stressed for her son, but did not want to discuss further at this time. She reports walking, reading the bible, and playing wordscape to help with stress reduction. She reports sleeping well now without restless leg. Giamarie has improved her PHQ from 12 down to 6.  She has changed up her medication timing and is sleeping better and feeling better.  She has also enjoyed  having family around this week for Thanksgiving!! Jessicaann reports no major stress concerns at this time - she relies on her husband for support. She takes CBD at night to help with sleep. She reports walking, reading the bible, and playing wordscape to help with stress reduction.     Expected Outcomes ST: continue with stress reducing habits LT: manage stress by utilizing tools and support system SHort: Conitnue to focus on positive Long: Continue to take care of  herself SHort: Conitnue to attend cardiac rehab for mental health boost Long: Continue to focus on positive     Interventions Encouraged to attend Cardiac Rehabilitation for the exercise Stress management education;Encouraged to attend Cardiac Rehabilitation for the exercise Stress management education;Encouraged to attend Cardiac Rehabilitation for the exercise     Continue Psychosocial Services  Follow up required by staff Follow up required by staff Follow up required by staff              Psychosocial Discharge (Final Psychosocial Re-Evaluation):  Psychosocial Re-Evaluation - 03/31/22 1124       Psychosocial Re-Evaluation   Current issues with Current Stress Concerns;Current Depression    Comments Magdalina reports no major stress concerns at this time - she relies on her husband for support. She takes CBD at night to help with sleep. She reports walking, reading the bible, and playing wordscape to help with stress reduction.    Expected Outcomes SHort: Conitnue to attend cardiac rehab for mental health boost Long: Continue to focus on positive    Interventions Stress management education;Encouraged to attend Cardiac Rehabilitation for the exercise    Continue Psychosocial Services  Follow up required by staff             Vocational Rehabilitation: Provide vocational rehab assistance to qualifying candidates.   Vocational Rehab Evaluation & Intervention:  Vocational Rehab - 02/05/22 1443       Initial Vocational Rehab Evaluation & Intervention   Assessment shows need for Vocational Rehabilitation No             Education: Education Goals: Education classes will be provided on a variety of topics geared toward better understanding of heart health and risk factor modification. Participant will state understanding/return demonstration of topics presented as noted by education test scores.  Learning Barriers/Preferences:  Learning Barriers/Preferences - 02/05/22 1443        Learning Barriers/Preferences   Learning Barriers None    Learning Preferences Individual Instruction             General Cardiac Education Topics:  AED/CPR: - Group verbal and written instruction with the use of models to demonstrate the basic use of the AED with the basic ABC's of resuscitation.   Anatomy and Cardiac Procedures: - Group verbal and visual presentation and models provide information about basic cardiac anatomy and function. Reviews the testing methods done to diagnose heart disease and the outcomes of the test results. Describes the treatment choices: Medical Management, Angioplasty, or Coronary Bypass Surgery for treating various heart conditions including Myocardial Infarction, Angina, Valve Disease, and  Cardiac Arrhythmias.  Written material given at graduation. Flowsheet Row Cardiac Rehab from 04/16/2022 in [facility] Cardiac and Pulmonary Rehab  Education need identified 02/24/22  Date 04/02/22  Educator SB  Instruction Review Code 1- Verbalizes Understanding       Medication Safety: - Group verbal and visual instruction to review commonly prescribed medications for heart and lung disease. Reviews the medication, class of the drug, and side effects. Includes the steps to properly store meds and maintain the prescription regimen.  Written material given at graduation. Flowsheet Row Cardiac Rehab from 04/16/2022 in [facility] Cardiac and Pulmonary Rehab  Date 04/09/22  Educator SB  Instruction Review Code 1- Verbalizes Understanding       Intimacy: - Group verbal instruction through game format to discuss how heart and lung disease can affect sexual intimacy. Written material given at graduation.. Flowsheet Row Cardiac Rehab from 04/16/2022 in [facility] Cardiac and Pulmonary Rehab  Date 03/05/22  Educator [facility]  Instruction Review Code 1- Verbalizes Understanding       Know Your Numbers and Heart Failure: - Group verbal and visual instruction to discuss disease risk  factors for cardiac and pulmonary disease and treatment options.  Reviews associated critical values for Overweight/Obesity, Hypertension, Cholesterol, and Diabetes.  Discusses basics of heart failure: signs/symptoms and treatments.  Introduces Heart Failure Zone chart for action plan for heart failure.  Written material given at graduation. Flowsheet Row Cardiac Rehab from 04/16/2022 in [facility] Cardiac and Pulmonary Rehab  Date 04/16/22  Educator SB  Instruction Review Code 1- Verbalizes Understanding       Infection Prevention: - Provides verbal and written material to individual with discussion of infection control including proper hand washing and proper equipment cleaning during exercise session. Flowsheet Row Cardiac Rehab from 04/16/2022 in [facility] Cardiac and Pulmonary Rehab  Date 02/24/22  Educator NT  Instruction Review Code 1- Verbalizes Understanding       Falls Prevention: - Provides verbal and written material to individual with discussion of falls prevention and safety. Flowsheet Row Cardiac Rehab from 04/16/2022 in [facility] Cardiac and Pulmonary Rehab  Date 02/24/22  Educator NT  Instruction Review Code 1- Verbalizes Understanding       Other: -Provides group and verbal instruction on various topics (see comments)   Knowledge Questionnaire Score:  Knowledge Questionnaire Score - 02/24/22 0948       Knowledge Questionnaire Score   Pre Score 22/26             Core Components/Risk Factors/Patient Goals at Admission:  Personal Goals and Risk Factors at Admission - 02/24/22 0953       Core Components/Risk Factors/Patient Goals on Admission    Weight Management Yes;Weight Loss    Intervention Weight Management: Develop a combined nutrition and exercise program designed to reach desired caloric intake, while maintaining appropriate intake of nutrient and fiber, sodium and fats, and appropriate energy expenditure required for the weight goal.;Weight Management:  Provide education and appropriate resources to help participant work on and attain dietary goals.    Admit Weight 189 lb 12.8 oz (86.1 kg)    Goal Weight: Short Term 184 lb (83.5 kg)    Goal Weight: Long Term 179 lb (81.2 kg)    Expected Outcomes Short Term: Continue to assess and modify interventions until short term weight is achieved;Long Term: Adherence to nutrition and physical activity/exercise program aimed toward attainment of established weight goal;Weight Loss: Understanding of general recommendations for a balanced deficit meal plan, which promotes 1-2 lb weight loss  per week and includes a negative energy balance of [phone] kcal/d;Understanding recommendations for meals to include 15-35% energy as protein, 25-35% energy from fat, 35-60% energy from carbohydrates, less than 232m of dietary cholesterol, 20-35 gm of total fiber daily;Understanding of distribution of calorie intake throughout the day with the consumption of 4-5 meals/snacks;Weight Gain: Understanding of general recommendations for a high calorie, high protein meal plan that promotes weight gain by distributing calorie intake throughout the day with the consumption for 4-5 meals, snacks, and/or supplements    Diabetes Yes    Intervention Provide education about signs/symptoms and action to take for hypo/hyperglycemia.;Provide education about proper nutrition, including hydration, and aerobic/resistive exercise prescription along with prescribed medications to achieve blood glucose in normal ranges: Fasting glucose 65-99 mg/dL    Expected Outcomes Short Term: Participant verbalizes understanding of the signs/symptoms and immediate care of hyper/hypoglycemia, proper foot care and importance of medication, aerobic/resistive exercise and nutrition plan for blood glucose control.;Long Term: Attainment of HbA1C < 7%.    Hypertension Yes    Intervention Provide education on lifestyle modifcations including regular physical  activity/exercise, weight management, moderate sodium restriction and increased consumption of fresh fruit, vegetables, and low fat dairy, alcohol moderation, and smoking cessation.;Monitor prescription use compliance.    Expected Outcomes Short Term: Continued assessment and intervention until BP is < 140/980mHG in hypertensive participants. < 130/8052mG in hypertensive participants with diabetes, heart failure or chronic kidney disease.;Long Term: Maintenance of blood pressure at goal levels.    Lipids Yes    Intervention Provide education and support for participant on nutrition & aerobic/resistive exercise along with prescribed medications to achieve LDL <19m26mDL >40mg35m Expected Outcomes Short Term: Participant states understanding of desired cholesterol values and is compliant with medications prescribed. Participant is following exercise prescription and nutrition guidelines.;Long Term: Cholesterol controlled with medications as prescribed, with individualized exercise RX and with personalized nutrition plan. Value goals: LDL < 19mg,64m > 40 mg.             Education:Diabetes - Individual verbal and written instruction to review signs/symptoms of diabetes, desired ranges of glucose level fasting, after meals and with exercise. Acknowledge that pre and post exercise glucose checks will be done for 3 sessions at entry of program. Flowsheet Row [facility]  Date 02/05/22  Educator [facility]  [facility] Centerruction Review Code 1- Verbalizes Understanding       Core Components/Risk Factors/Patient Goals Review:   Goals and Risk Factor Review     Row Name 03/10/22 1144 03/10/22 1147 03/31/22 1113         Core Components/Risk Factors/Patient Goals Review   Personal Goals Review Diabetes;Hypertension Diabetes;Hypertension Diabetes;Hypertension     Review Maytte reports that her BG and BP was much more elevated since her hospital admission. She  started taking her long acting insulin at night instead of during the day as it was giving her restless legs- encouraged her to let her doctor know - she sees her MD 11/31/23, advised contacting office before then to let them know. Her BG has been over 300 most mornings - discussed nutrition with RD, working on changes to diet. Myrical reports her BP has been around 150/80 at rest which is higher than her normal - she reports her MD wanted her to start back on Losartin, but did not give her a prescription - encouraged her to reach back out to her cardiologist regarding this. -- Neal.  reports still working with her wound from surgery - she is working with her MD to help it heal - she was refered to a wound clinic, but does not know when they will call her; she uses a silver sulfate cream to help with debridement as well as antibiotic salve. She reports that her wounds are getting better and healing, but it is getting more sore. Right now her foot is also acting up; she has broken her toe, she brought it up to her podiatrist and they recommended toe straighteners to avoid amputation; she reports it is doing ok, but is still tender. She reports her BG was 293 this morning - she continues to work on her nutrition. She reports her BP has been running up and down - she feels it gets higher from her snacking- she is going to try adding less salt to her snack mix. She continues to take her medication as directed with no issues, however, they have been adjusting her warfarin. Additionally, she is waiting for more ozempic.     Expected Outcomes ST: contact MD regarding insulin switch and follow up regarding Losartin LT: manage risk factors -- ST: continue to work on diet and home exercise LT: continue to monitor risk factors              Core Components/Risk Factors/Patient Goals at Discharge (Final Review):   Goals and Risk Factor Review - 03/31/22 1113       Core Components/Risk Factors/Patient Goals Review    Personal Goals Review Diabetes;Hypertension    Review Zarahi.  reports still working with her wound from surgery - she is working with her MD to help it heal - she was refered to a wound clinic, but does not know when they will call her; she uses a silver sulfate cream to help with debridement as well as antibiotic salve. She reports that her wounds are getting better and healing, but it is getting more sore. Right now her foot is also acting up; she has broken her toe, she brought it up to her podiatrist and they recommended toe straighteners to avoid amputation; she reports it is doing ok, but is still tender. She reports her BG was 293 this morning - she continues to work on her nutrition. She reports her BP has been running up and down - she feels it gets higher from her snacking- she is going to try adding less salt to her snack mix. She continues to take her medication as directed with no issues, however, they have been adjusting her warfarin. Additionally, she is waiting for more ozempic.    Expected Outcomes ST: continue to work on diet and home exercise LT: continue to monitor risk factors             ITP Comments:  ITP Comments     Row Name 02/05/22 1453 02/24/22 0947 02/25/22 0946 03/03/22 1149 03/25/22 0813   ITP Comments Initial phone call completed. Diagnosis can be found in [facility] 9/11. EP Orientation scheduled for Monday 10/23 at 1:30. Completed 6MWT and gym orientation. Initial ITP created and sent for review to Dr. Emily Filbert, Medical Director. 30 Day review completed. Medical Director ITP review done, changes made as directed, and signed approval by Medical Director.   NEW TO PROGRAM First full day of exercise!  Patient was oriented to gym and equipment including functions, settings, policies, and procedures.  Patient's individual exercise prescription and treatment plan were reviewed.  All starting workloads were established based  on the results of the 6 minute walk test done at  initial orientation visit.  The plan for exercise progression was also introduced and progression will be customized based on patient's performance and goals. 30 Day review completed. Medical Director ITP review done, changes made as directed, and signed approval by Medical Director.   New to program    Row Name 04/22/22 1025           ITP Comments 30 Day review completed. Medical Director ITP review done, changes made as directed, and signed approval by Medical Director.                Comments:

## 2022-04-28 ENCOUNTER — Encounter: Payer: Medicaid Other | Attending: Internal Medicine | Admitting: *Deleted

## 2022-04-28 DIAGNOSIS — Z952 Presence of prosthetic heart valve: Secondary | ICD-10-CM | POA: Diagnosis present

## 2022-04-28 NOTE — Progress Notes (Signed)
Daily Session Note  Patient Details  Name: Nancy [name] MRN: [id number] Date of Birth: [birth year] Referring Provider:   Flowsheet Row Cardiac Rehab from 02/24/2022 in [facility] Cardiac and Pulmonary Rehab  Referring Provider Serafina Royals MD       Encounter Date: 04/28/2022  Check In:  Session Check In - 04/28/22 1102       Check-In   Supervising physician immediately available to respond to emergencies See telemetry face sheet for immediately available ER MD    Location [facility]-Cardiac & Pulmonary Rehab    Staff Present Renita Papa, RN BSN;Noah Tickle, BS, Exercise Physiologist;Jessica [place], MA, RCEP, CCRP, Bertram Gala, MS, ACSM CEP, Exercise Physiologist    Virtual Visit No    Medication changes reported     No    Fall or balance concerns reported    No    Warm-up and Cool-down Performed on first and last piece of equipment    Resistance Training Performed Yes    VAD Patient? No    PAD/SET Patient? No      Pain Assessment   Currently in Pain? No/denies                Social History   Tobacco Use  Smoking Status Former   Packs/day: 0.25   Years: 13.00   Total pack years: 3.25   Types: Cigarettes  Smokeless Tobacco Never  Tobacco Comments   age [age]    Goals Met:  Independence with exercise equipment Exercise tolerated well No report of concerns or symptoms today Strength training completed today  Goals Unmet:  Not Applicable  Comments: Pt able to follow exercise prescription today without complaint.  Will continue to monitor for progression.    Dr. Emily Filbert is Medical Director for [place].  Dr. Ottie Glazier is Medical Director for [facility] Pulmonary Rehabilitation.

## 2022-04-30 ENCOUNTER — Encounter: Payer: Medicaid Other | Admitting: *Deleted

## 2022-04-30 DIAGNOSIS — Z952 Presence of prosthetic heart valve: Secondary | ICD-10-CM | POA: Diagnosis not present

## 2022-04-30 NOTE — Progress Notes (Signed)
Daily Session Note  Patient Details  Name: Nancy [name] MRN: [id number] Date of Birth: April 19, 1961 Referring Provider:   Flowsheet Row Cardiac Rehab from 02/24/2022 in [facility] Cardiac and Pulmonary Rehab  Referring Provider Serafina Royals MD       Encounter Date: 04/30/2022  Check In:  Session Check In - 04/30/22 1124       Check-In   Supervising physician immediately available to respond to emergencies See telemetry face sheet for immediately available ER MD    Location [facility]-Cardiac & Pulmonary Rehab    Staff Present Darlyne Russian, RN, Lorin Mercy, MS, ACSM CEP, Exercise Physiologist;Joseph Tessie Fass, Cristopher Estimable, RN BSN    Virtual Visit No    Medication changes reported     No    Fall or balance concerns reported    No    Warm-up and Cool-down Performed on first and last piece of equipment    Resistance Training Performed Yes    VAD Patient? No    PAD/SET Patient? No      Pain Assessment   Currently in Pain? No/denies                Social History   Tobacco Use  Smoking Status Former   Packs/day: 0.25   Years: 13.00   Total pack years: 3.25   Types: Cigarettes  Smokeless Tobacco Never  Tobacco Comments   age [age]    Goals Met:  Independence with exercise equipment Exercise tolerated well No report of concerns or symptoms today Strength training completed today  Goals Unmet:  Not Applicable  Comments: Pt able to follow exercise prescription today without complaint.  Will continue to monitor for progression.    Dr. Emily Filbert is Medical Director for [place].  Dr. Ottie Glazier is Medical Director for [facility] Pulmonary Rehabilitation.

## 2022-05-05 ENCOUNTER — Encounter: Payer: Medicaid Other | Admitting: *Deleted

## 2022-05-05 DIAGNOSIS — Z952 Presence of prosthetic heart valve: Secondary | ICD-10-CM | POA: Diagnosis not present

## 2022-05-05 NOTE — Progress Notes (Signed)
Daily Session Note  Patient Details  Name: Nancy [name] MRN: [id number] Date of Birth: 1960/05/29 Referring Provider:   Flowsheet Row Cardiac Rehab from 02/24/2022 in [facility] Cardiac and Pulmonary Rehab  Referring Provider Serafina Royals MD       Encounter Date: 05/05/2022  Check In:  Session Check In - 05/05/22 1144       Check-In   Supervising physician immediately available to respond to emergencies See telemetry face sheet for immediately available ER MD    Location [facility]-Cardiac & Pulmonary Rehab    Staff Present Heath Lark, RN, BSN, CCRP;Jessica Dawson, MA, RCEP, CCRP, Bertram Gala, MS, ACSM CEP, Exercise Physiologist    Virtual Visit No    Medication changes reported     No    Fall or balance concerns reported    No    Warm-up and Cool-down Performed on first and last piece of equipment    Resistance Training Performed Yes    VAD Patient? No    PAD/SET Patient? No      Pain Assessment   Currently in Pain? No/denies                Social History   Tobacco Use  Smoking Status Former   Packs/day: 0.25   Years: 13.00   Total pack years: 3.25   Types: Cigarettes  Smokeless Tobacco Never  Tobacco Comments   age [age]    Goals Met:  Independence with exercise equipment Exercise tolerated well  Goals Unmet:  Not Applicable  Comments: Pt able to follow exercise prescription today without complaint.  Will continue to monitor for progression. Nancy [name] told staff she had  stopped  her exercise because she had 9/10 chest oain. The poain resolved as soon as she stopped. No more episodes this session.  She was advised to call MD if the episodes increase in frequency or severity.  To call 911 if she has symptoms that won't resolve. She verbalized understanding.    Dr. Emily Filbert is Medical Director for [place].  Dr. Ottie Glazier is Medical Director for [facility] Pulmonary Rehabilitation.

## 2022-05-07 ENCOUNTER — Encounter: Payer: Medicaid Other | Admitting: *Deleted

## 2022-05-07 DIAGNOSIS — Z952 Presence of prosthetic heart valve: Secondary | ICD-10-CM | POA: Diagnosis not present

## 2022-05-07 NOTE — Progress Notes (Signed)
Daily Session Note  Patient Details  Name: Nancy [name] MRN: [id number] Date of Birth: [birth year] Referring Provider:   Flowsheet Row Cardiac Rehab from 02/24/2022 in [facility] Cardiac and Pulmonary Rehab  Referring Provider Serafina Royals MD       Encounter Date: 05/07/2022  Check In:  Session Check In - 05/07/22 1112       Check-In   Supervising physician immediately available to respond to emergencies See telemetry face sheet for immediately available ER MD    Location [facility]-Cardiac & Pulmonary Rehab    Staff Present Darlyne Russian, RN, ADN;Meredith Sherryll Burger, RN Abel Presto, MS, ACSM CEP, Exercise Physiologist;Joseph Tessie Fass, [place]    Virtual Visit No    Medication changes reported     No    Fall or balance concerns reported    No    Warm-up and Cool-down Performed on first and last piece of equipment    Resistance Training Performed Yes    VAD Patient? No    PAD/SET Patient? No      Pain Assessment   Currently in Pain? No/denies                Social History   Tobacco Use  Smoking Status Former   Packs/day: 0.25   Years: 13.00   Total pack years: 3.25   Types: Cigarettes  Smokeless Tobacco Never  Tobacco Comments   age [age]    Goals Met:  Independence with exercise equipment Exercise tolerated well No report of concerns or symptoms today Strength training completed today  Goals Unmet:  Not Applicable  Comments: Pt able to follow exercise prescription today without complaint.  Will continue to monitor for progression.    Dr. Emily Filbert is Medical Director for [place].  Dr. Ottie Glazier is Medical Director for [facility] Pulmonary Rehabilitation.

## 2022-05-14 ENCOUNTER — Encounter: Payer: Medicaid Other | Admitting: *Deleted

## 2022-05-14 DIAGNOSIS — Z952 Presence of prosthetic heart valve: Secondary | ICD-10-CM | POA: Diagnosis not present

## 2022-05-14 NOTE — Progress Notes (Signed)
Daily Session Note  Patient Details  Name: Nancy [name] MRN: [id number] Date of Birth: 1960/09/18 Referring Provider:   Flowsheet Row Cardiac Rehab from 02/24/2022 in [facility] Cardiac and Pulmonary Rehab  Referring Provider Serafina Royals MD       Encounter Date: 05/14/2022  Check In:  Session Check In - 05/14/22 1115       Check-In   Supervising physician immediately available to respond to emergencies See telemetry face sheet for immediately available ER MD    Location [facility]-Cardiac & Pulmonary Rehab    Staff Present Darlyne Russian, RN, ADN;Meredith Sherryll Burger, RN Abel Presto, MS, ACSM CEP, Exercise Physiologist;Joseph Tessie Fass, [place]    Virtual Visit No    Medication changes reported     No    Fall or balance concerns reported    No    Tobacco Cessation No Change    Warm-up and Cool-down Performed on first and last piece of equipment    Resistance Training Performed Yes    VAD Patient? No    PAD/SET Patient? No      Pain Assessment   Currently in Pain? No/denies                Social History   Tobacco Use  Smoking Status Former   Packs/day: 0.25   Years: 13.00   Total pack years: 3.25   Types: Cigarettes  Smokeless Tobacco Never  Tobacco Comments   age [age]    Goals Met:  Independence with exercise equipment Exercise tolerated well No report of concerns or symptoms today Strength training completed today  Goals Unmet:  Not Applicable  Comments: Pt able to follow exercise prescription today without complaint.  Will continue to monitor for progression.    Dr. Emily Filbert is Medical Director for [place].  Dr. Ottie Glazier is Medical Director for [facility] Pulmonary Rehabilitation.

## 2022-05-19 ENCOUNTER — Encounter: Payer: Medicaid Other | Admitting: *Deleted

## 2022-05-19 DIAGNOSIS — Z952 Presence of prosthetic heart valve: Secondary | ICD-10-CM

## 2022-05-19 NOTE — Progress Notes (Signed)
Daily Session Note  Patient Details  Name: Nancy [name] MRN: [id number] Date of Birth: 12/11/1960 Referring Provider:   Flowsheet Row Cardiac Rehab from 02/24/2022 in [facility] Cardiac and Pulmonary Rehab  Referring Provider Serafina Royals MD       Encounter Date: 05/19/2022  Check In:  Session Check In - 05/19/22 1105       Check-In   Supervising physician immediately available to respond to emergencies See telemetry face sheet for immediately available ER MD    Location [facility]-Cardiac & Pulmonary Rehab    Staff Present Renita Papa, RN BSN;Noah Tickle, BS, Exercise Physiologist;Kara Maricela Bo, MS, ACSM CEP, Exercise Physiologist    Virtual Visit No    Medication changes reported     No    Fall or balance concerns reported    No    Warm-up and Cool-down Performed on first and last piece of equipment    Resistance Training Performed Yes    VAD Patient? No    PAD/SET Patient? No      Pain Assessment   Currently in Pain? No/denies                Social History   Tobacco Use  Smoking Status Former   Packs/day: 0.25   Years: 13.00   Total pack years: 3.25   Types: Cigarettes  Smokeless Tobacco Never  Tobacco Comments   age [age]    Goals Met:  Independence with exercise equipment Exercise tolerated well No report of concerns or symptoms today Strength training completed today  Goals Unmet:  Not Applicable  Comments: Pt able to follow exercise prescription today without complaint.  Will continue to monitor for progression.    Dr. Emily Filbert is Medical Director for [place].  Dr. Ottie Glazier is Medical Director for [facility] Pulmonary Rehabilitation.

## 2022-05-20 ENCOUNTER — Encounter: Payer: Medicaid Other | Admitting: *Deleted

## 2022-05-20 ENCOUNTER — Encounter: Payer: Self-pay | Admitting: *Deleted

## 2022-05-20 DIAGNOSIS — Z952 Presence of prosthetic heart valve: Secondary | ICD-10-CM

## 2022-05-20 NOTE — Progress Notes (Signed)
Cardiac Individual Treatment Plan  Patient Details  Name: Nancy [name] MRN: [id number] Date of Birth: [birth year] Referring Provider:   Flowsheet Row Cardiac Rehab from 02/24/2022 in [facility] Cardiac [facility] Pulmonary Rehab  Referring Provider Serafina Royals MD       Initial Encounter Date:  Flowsheet Row Cardiac Rehab from 02/24/2022 in [facility] Cardiac and Pulmonary Rehab  Date 02/24/22       Visit Diagnosis: S/P AVR (aortic valve replacement)  Patient's Home Medications on Admission:  Current Outpatient Medications:    acetaminophen-codeine (TYLENOL #3) 300-30 MG tablet, Take 2 tablets by mouth at bedtime., Disp: , Rfl: 0   albuterol (PROVENTIL HFA;VENTOLIN HFA) 108 (90 Base) MCG/ACT inhaler, Inhale 2 puffs into the lungs every 6 (six) hours as needed for wheezing or shortness of breath., Disp: , Rfl:    amLODipine (NORVASC) 10 MG tablet, Take 10 mg by mouth every morning. , Disp: , Rfl: 5   aspirin EC 81 MG tablet, Take 81 mg by mouth daily., Disp: , Rfl:    CINNAMON PO, Take 2 capsules by mouth 2 (two) times daily., Disp: , Rfl:    diclofenac sodium (VOLTAREN) 1 % GEL, Apply 1 application topically daily as needed (for pain)., Disp: , Rfl:    furosemide (LASIX) 40 MG tablet, Take 1 tablet (40 mg total) by mouth daily., Disp: 30 tablet, Rfl: 11   gabapentin (NEURONTIN) 300 MG capsule, Take 300 mg by mouth at bedtime., Disp: , Rfl: 3   gabapentin (NEURONTIN) 300 MG capsule, Take 1 capsule (300 mg total) by mouth 3 (three) times daily for 21 days., Disp: 63 capsule, Rfl: 0   GARCINIA CAMBO[place]IA-CHROMIUM PO, Take 2 capsules by mouth daily., Disp: , Rfl:    JARDIANCE 25 MG TABS tablet, Take 25 mg by mouth daily., Disp: , Rfl:    LANTUS SOLOSTAR 100 UNIT/ML Solostar Pen, Inject 38 Units into the skin at bedtime., Disp: , Rfl: 3   losartan (COZAAR) 100 MG tablet, Take 100 mg by mouth every morning. , Disp: , Rfl: 5   metoprolol succinate (TOPROL-XL) 50 MG 24 hr tablet, Take 50 mg by mouth  every morning. , Disp: , Rfl: 2   montelukast (SINGULAIR) 10 MG tablet, Take 10 mg by mouth daily., Disp: , Rfl:    nystatin cream (MYCOSTATIN), Apply topically., Disp: , Rfl:    OZEMPIC, 2 MG/DOSE, 8 MG/3ML SOPN, Inject into the skin., Disp: , Rfl:    Probiotic Product (PROBIOTIC PO), Take 1 capsule by mouth daily., Disp: , Rfl:    SILVADENE 1 % cream, SMARTSIG:sparingly Topical Twice Daily PRN, Disp: , Rfl:    SYMBICORT 160-4.5 MCG/ACT inhaler, Inhale 2 puffs into the lungs 2 (two) times daily as needed., Disp: , Rfl: 5   traZODone (DESYREL) 50 MG tablet, Take 0.5 tablets (25 mg total) by mouth at bedtime as needed for sleep., Disp: 30 tablet, Rfl: 0   TURMERIC PO, Take 1 capsule by mouth daily., Disp: , Rfl:   Past Medical History: Past Medical History:  Diagnosis Date   Anxiety    Arthritis    Asthma    Depression    Diabetes mellitus without complication (HCC)    GERD (gastroesophageal reflux disease)    no meds   Heart murmur    History of kidney stones    h/o   Hypertension    Myocardial infarction ([place]) 2015   Pneumonia 2014   Seizures ([place])    as a child-no since age 26  Severe aortic stenosis     Tobacco Use: Social History   Tobacco Use  Smoking Status Former   Packs/day: 0.25   Years: 13.00   Total pack years: 3.25   Types: Cigarettes  Smokeless Tobacco Never  Tobacco Comments   age [age]    Labs: Review Flowsheet       Latest Ref Rng & Units 01/03/2022  Labs for ITP Cardiac and Pulmonary Rehab  Hemoglobin A1c 4.8 - 5.6 % 8.4      Exercise Target Goals: Exercise Program Goal: Individual exercise prescription set using results from initial 6 min walk test and THRR while considering  patient's activity barriers and safety.   Exercise Prescription Goal: Initial exercise prescription builds to 30-45 minutes a day of aerobic activity, 2-3 days per week.  Home exercise guidelines will be given to patient during program as part of exercise prescription  that the participant will acknowledge.   Education: Aerobic Exercise: - Group verbal and visual presentation on the components of exercise prescription. Introduces F.I.T.T principle from ACSM for exercise prescriptions.  Reviews F.I.T.T. principles of aerobic exercise including progression. Written material given at graduation. Flow[facility]  Education need identified 02/24/22  Date 05/14/22  [facility]  Instruction Review Code 1- Verbalizes Understanding       Education: Resistance Exercise: - Group verbal and visual presentation on the components of exercise prescription. Introduces F.I.T.T principle from ACSM for exercise prescriptions  Reviews F.I.T.T. principles of resistance exercise including progression. Written material given at graduation.    Education: Exercise & Equipment Safety: - Individual verbal instruction and demonstration of equipment use and safety with use of the equipment. Flow[facility]  Date 02/24/22  Educator NT  Instruction Review Code 1- Verbalizes Understanding       Education: Exercise Physiology & General Exercise Guidelines: - Group verbal and written instruction with models to review the exercise physiology of the cardiovascular system and associated critical values. Provides general exercise guidelines with specific guidelines to those with heart or lung disease.  [facility]  Date 05/07/22  [facility]  Instruction Review Code 1- Verbalizes Understanding       Education: Flexibility, Balance, Mind/Body Relaxation: - Group verbal and visual presentation with interactive activity on the components of exercise prescription. Introduces F.I.T.T principle from ACSM for exercise prescriptions. Reviews F.I.T.T. principles of flexibility and balance exercise  training including progression. Also discusses the mind body connection.  Reviews various relaxation techniques to help reduce and manage stress (i.e. Deep breathing, progressive muscle relaxation, and visualization). Balance handout provided to take home. Written material given at graduation.   Activity Barriers & Risk Stratification:  Activity Barriers & Cardiac Risk Stratification - 02/24/22 1008       Activity Barriers & Cardiac Risk Stratification   Activity Barriers Arthritis;Other (comment);Joint Problems    Comments right foot- broken toe, Hip Pain    Cardiac Risk Stratification Moderate             6 Minute Walk:  6 Minute Walk     Row Name 02/24/22 1006         6 Minute Walk   Phase Initial     Distance 1085 feet     Walk Time 6 minutes     # of Rest Breaks 0     MPH 2.05  METS 2.82     RPE 7     Perceived Dyspnea  0     VO2 Peak 9.87     Symptoms Yes (comment)     Comments Hip Pain 8/10     Resting HR 90 bpm     Resting BP 142/78     Resting Oxygen Saturation  97 %     Exercise Oxygen Saturation  during 6 min walk 95 %     Max Ex. HR 107 bpm     Max Ex. BP 144/84     2 Minute Post BP 140/78              Oxygen Initial Assessment:   Oxygen Re-Evaluation:   Oxygen Discharge (Final Oxygen Re-Evaluation):   Initial Exercise Prescription:  Initial Exercise Prescription - 02/24/22 1000       Date of Initial Exercise RX and Referring Provider   Date 02/24/22    Referring Provider Serafina Royals MD      Oxygen   Maintain Oxygen Saturation 88% or higher      Treadmill   MPH 1.8    Grade 0.5    Minutes 15    METs 2.5      NuStep   Level 2    SPM 80    Minutes 15    METs 2.82      Recumbant Elliptical   Level 1    RPM 50    Watts 23    Minutes 15    METs 2.82      T5 Nustep   Level 1    SPM 80    Minutes 15    METs 2.82      Prescription Details   Frequency (times per week) 2    Duration Progress to 30 minutes of  continuous aerobic without signs/symptoms of physical distress      Intensity   THRR 40-80% of Max Heartrate 117-144    Ratings of Perceived Exertion 11-13    Perceived Dyspnea 0-4      Progression   Progression Continue to progress workloads to maintain intensity without signs/symptoms of physical distress.      Resistance Training   Training Prescription Yes    Weight 3 lb    Reps 10-15             Perform Capillary Blood Glucose checks as needed.  Exercise Prescription Changes:   Exercise Prescription Changes     Row Name 02/24/22 1000 03/17/22 1500 03/25/22 1100 04/01/22 1300 04/15/22 1300     Response to Exercise   Blood Pressure (Admit) 142/78 132/88 -- 132/62 126/64   Blood Pressure (Exercise) 144/84 158/82 -- 166/78 162/84   Blood Pressure (Exit) 140/78 128/80 -- 122/64 126/62   Heart Rate (Admit) 90 bpm 91 bpm -- 90 bpm 91 bpm   Heart Rate (Exercise) 107 bpm 105 bpm -- 108 bpm 113 bpm   Heart Rate (Exit) 95 bpm 92 bpm -- 93 bpm 98 bpm   Oxygen Saturation (Admit) 97 % -- -- -- --   Oxygen Saturation (Exercise) 95 % -- -- -- --   Rating of Perceived Exertion (Exercise) 7 12 -- 12 15   Perceived Dyspnea (Exercise) 0 -- -- -- --   Symptoms Hip Pain 8/10 calf pain -- none none   Comments 6MWT Results First four rehab sessions -- -- --   Duration -- Progress to 30 minutes of  aerobic without signs/symptoms of physical distress -- Continue  with 30 min of aerobic exercise without signs/symptoms of physical distress. Continue with 30 min of aerobic exercise without signs/symptoms of physical distress.   Intensity -- THRR unchanged -- THRR unchanged THRR unchanged     Progression   Progression -- Continue to progress workloads to maintain intensity without signs/symptoms of physical distress. -- Continue to progress workloads to maintain intensity without signs/symptoms of physical distress. Continue to progress workloads to maintain intensity without signs/symptoms of  physical distress.   Average METs -- 2.67 -- 2.38 2.3     Resistance Training   Training Prescription -- Yes -- Yes Yes   Weight -- 3 lb -- 3 lb 3 lb   Reps -- 10-15 -- 10-15 10-15     Interval Training   Interval Training -- No -- -- No     Treadmill   MPH -- 2 -- 2 2.1   Grade -- 0.5 -- 0.5 1   Minutes -- 15 -- 15 15   METs -- 2.67 -- 2.67 2.9     NuStep   Level -- 6 -- -- 6   Minutes -- 15 -- -- 15   METs -- 5 -- -- 2     Recumbant Elliptical   Level -- 3 -- 3 1   Minutes -- 15 -- 15 15   METs -- 2.6 -- 2.6 1.7     REL-XR   Level -- -- -- 4 --   Minutes -- -- -- 15 --     Home Exercise Plan   Plans to continue exercise at -- -- Home (comment)  walking Home (comment)  walking Home (comment)  walking   Frequency -- -- Add 3 additional days to program exercise sessions. Add 3 additional days to program exercise sessions. Add 3 additional days to program exercise sessions.   Initial Home Exercises Provided -- -- 03/25/22 03/25/22 03/25/22     Oxygen   Maintain Oxygen Saturation -- 88% or higher 88% or higher 88% or higher 88% or higher    Row Name 04/29/22 1100 05/12/22 1300           Response to Exercise   Blood Pressure (Admit) 146/74 128/62      Blood Pressure (Exit) 134/76 128/62      Heart Rate (Admit) 93 bpm 86 bpm      Heart Rate (Exercise) 105 bpm 106 bpm      Heart Rate (Exit) 95 bpm 85 bpm      Rating of Perceived Exertion (Exercise) 15 14      Symptoms none none      Duration Continue with 30 min of aerobic exercise without signs/symptoms of physical distress. Continue with 30 min of aerobic exercise without signs/symptoms of physical distress.      Intensity THRR unchanged THRR unchanged        Progression   Progression Continue to progress workloads to maintain intensity without signs/symptoms of physical distress. Continue to progress workloads to maintain intensity without signs/symptoms of physical distress.      Average METs 2.79 2.5         Resistance Training   Training Prescription Yes Yes      Weight 3 lb 3 lb      Reps 10-15 10-15        Interval Training   Interval Training No No        Treadmill   MPH 2.4 2.1      Grade 1 1  Minutes 15 15      METs 3.26 2.9        Recumbant Bike   Level -- 4      Watts -- 26      Minutes -- 15        NuStep   Level 4 --      Minutes 15 --      METs 2.2 --        Recumbant Elliptical   Level -- 1      Minutes -- 15      METs -- 1.7        REL-XR   Level 4 2      Minutes 15 15      METs 3 --        Home Exercise Plan   Plans to continue exercise at Home (comment)  walking Home (comment)  walking      Frequency Add 3 additional days to program exercise sessions. Add 3 additional days to program exercise sessions.      Initial Home Exercises Provided 03/25/22 03/25/22        Oxygen   Maintain Oxygen Saturation 88% or higher 88% or higher               Exercise Comments:   Exercise Comments     Row Name 03/03/22 1149 05/05/22 1151         Exercise Comments First full day of exercise!  Patient was oriented to gym and equipment including functions, settings, policies, and procedures.  Patient's individual exercise prescription and treatment plan were reviewed.  All starting workloads were established based on the results of the 6 minute walk test done at initial orientation visit.  The plan for exercise progression was also introduced and progression will be customized based on patient's performance and goals. Nancy [name] told staff she had  stopped  her exercise because she had 9/10 chest oain. The poain resolved as soon as she stopped. No more episodes this session.  She was advised to call MD if the episodes increase in frequency or severity.  To call 911 if she has symptoms that won't resolve. She verbalized understanding.               Exercise Goals and Review:   Exercise Goals     Row Name 02/24/22 1023             Exercise Goals   Increase  Physical Activity Yes       Intervention Provide advice, education, support and counseling about physical activity/exercise needs.;Develop an individualized exercise prescription for aerobic and resistive training based on initial evaluation findings, risk stratification, comorbidities and participant's personal goals.       Expected Outcomes Short Term: Attend rehab on a regular basis to increase amount of physical activity.;Long Term: Add in home exercise to make exercise part of routine and to increase amount of physical activity.;Long Term: Exercising regularly at least 3-5 days a week.       Increase Strength and Stamina Yes       Intervention Provide advice, education, support and counseling about physical activity/exercise needs.;Develop an individualized exercise prescription for aerobic and resistive training based on initial evaluation findings, risk stratification, comorbidities and participant's personal goals.       Expected Outcomes Short Term: Increase workloads from initial exercise prescription for resistance, speed, and METs.;Short Term: Perform resistance training exercises routinely during rehab and add in resistance training at home;Long Term: Improve cardiorespiratory  fitness, muscular endurance and strength as measured by increased METs and functional capacity ( )       Able to understand and use rate of perceived exertion (RPE) scale Yes       Intervention Provide education and explanation on how to use RPE scale       Expected Outcomes Short Term: Able to use RPE daily in rehab to express subjective intensity level;Long Term:  Able to use RPE to guide intensity level when exercising independently       Able to understand and use Dyspnea scale Yes       Intervention Provide education and explanation on how to use Dyspnea scale       Expected Outcomes Short Term: Able to use Dyspnea scale daily in rehab to express subjective sense of shortness of breath during exertion;Long Term:  Able to use Dyspnea scale to guide intensity level when exercising independently       Knowledge and understanding of Target Heart Rate Range (THRR) Yes       Intervention Provide education and explanation of THRR including how the numbers were predicted and where they are located for reference       Expected Outcomes Short Term: Able to state/look up THRR;Long Term: Able to use THRR to govern intensity when exercising independently;Short Term: Able to use daily as guideline for intensity in rehab       Able to check pulse independently Yes       Intervention Provide education and demonstration on how to check pulse in carotid and radial arteries.;Review the importance of being able to check your own pulse for safety during independent exercise       Expected Outcomes Short Term: Able to explain why pulse checking is important during independent exercise;Long Term: Able to check pulse independently and accurately       Understanding of Exercise Prescription Yes       Intervention Provide education, explanation, and written materials on patient's individual exercise prescription       Expected Outcomes Short Term: Able to explain program exercise prescription;Long Term: Able to explain home exercise prescription to exercise independently                Exercise Goals Re-Evaluation :  Exercise Goals Re-Evaluation     Row Name 02/24/22 1025 03/03/22 1149 03/10/22 1141 03/17/22 1521 03/25/22 1115     Exercise Goal Re-Evaluation   Exercise Goals Review -- Knowledge and understanding of Target Heart Rate Range (THRR);Able to understand and use rate of perceived exertion (RPE) scale;Understanding of Exercise Prescription;Able to understand and use Dyspnea scale Knowledge and understanding of Target Heart Rate Range (THRR);Able to understand and use rate of perceived exertion (RPE) scale;Understanding of Exercise Prescription;Able to understand and use Dyspnea scale Understanding of Exercise  Prescription;Increase Physical Activity;Increase Strength and Stamina Understanding of Exercise Prescription;Increase Physical Activity;Increase Strength and Stamina   Comments -- Reviewed RPE scale, THR and program prescription with pt today.  Pt voiced understanding and was given a copy of goals to take home. She reports not walking as much outside due to the colder weather. She has thought about getting a stepper for thier home. EP has not gone over home exercise yet. Nancy [name] is doing well in rehab. She averaged 2.67 METs during her first three rehab sessions. She also was able to increase her speed on the treadmill to 2 mph. She improved to level 3 on the REL and level 6 on the T4 as well. We  will continue to monitor her progress in the program. Reviewed home exercise with pt today.  Pt plans to walk for exercise. She is not interested in joining a gym. Patient currently walks outside for about 30 minutes at one time, which include variations in elevation. Now that it is getting colder outside, she was encouraged to keep walking indoors, perhaps at a grocery store like [employer], [employer], etc. She has a BP cuff that checks her HR but is interested in purchasing a pulse ox. Reviewed THR, pulse, RPE, sign and symptoms, pulse oximetery and when to call 911 or MD.  Also discussed weather considerations and indoor options.  Pt voiced understanding.   Expected Outcomes -- Short: Use RPE daily to regulate intensity. Long: Follow program prescription in THR. Short: EP to go over home exercise. Long: Follow program prescription in THR. Short: Continue to increase workloads as tolerated. Long: Continue to increase strength and stamina. Short: Start checking HR during walks Long: Continue to exercise independently at appropriate prescription    Row Name 03/31/22 1110 04/01/22 1316 04/15/22 1324 04/28/22 1110 04/29/22 1154     Exercise Goal Re-Evaluation   Exercise Goals Review -- Understanding of Exercise  Prescription;Increase Physical Activity;Increase Strength and Stamina Understanding of Exercise Prescription;Increase Physical Activity;Increase Strength and Stamina Understanding of Exercise Prescription;Increase Physical Activity;Increase Strength and Stamina Understanding of Exercise Prescription;Increase Physical Activity;Increase Strength and Stamina   Comments Nancy [name] reports not being able to start home exercise yet asie from some walking. She has weights and our youtube channel that she would like to check out. Encouraged her to begin incorporating home exercise. Nancy [name] continues to do well in rehab. She has increased to level 4 on the XR! She is not quite hitting her THR but her RPES are in appropriate range. She could benefit from increasing her incline on the treadmill. Will continue to monitor. Nancy [name] is doing well in rehab. She has consistently kept her overall average MET level above 2.3 METs. She also was able to increase her workload on the treadmill to a speed of 2.1 mph and an incline of 1%. She has also done well with 3 lb hand weights for resistance training, and may benefit from trying 4 lbs. We will continue to monitor her progress. Nancy [name] is doing well in rehab. She was out due to a UTI for about two weeks but is ready to get back to her normal routine. She has steadily been increasing her workloads. She states that she was feeling a lot better before the UTI, specifically she states that she could see an increase in her stamina. She has been walking at home for around 20 minutes on her days away from rehab. We will continue to monitor her progress in the program. Nancy [name] continues to do well in rehab. She did increase her treadmill speed up to 2.4 mph now working over 3 METS. Her level on the T4 Nustep has stayed consistent at level 4, prior to that she was doing 6, and would benefit from increasing that back up. She is not quite hitting her THR but RPEs are staying in appropriate range. Will  continue to monitor.   Expected Outcomes -- Short: Increase incline on TM to 1% Long: Continue to increase overall MET level Short: Try 4 lb hand weights. Long: Continue to improve strength and stamina. Short: Slowly return to exercise routine after dealing with UTI. Long: Continue to improve strength and stamina. Short: Increase T4 Nustep back up to 6 Long: Continue to  increase overall MET level    Row Name 05/12/22 1323             Exercise Goal Re-Evaluation   Exercise Goals Review Understanding of Exercise Prescription;Increase Physical Activity;Increase Strength and Stamina       Comments Nancy [name] continues to do well in rehab. She has consistently worked at a MET level above 2.5 METs. She has also started to add incline on the treadmill again after being out for a couple of weeks. She was up to 2.4 mph with an incline of 1% on the treadmill, since returning she is at 2.1 mph and an incline of 1%. We will encourage her to continue to increase her treadmill workload. We will continue to monitor her progress.       Expected Outcomes Short: Increase treadmill speed back up to 2.4 mph. Long: Continue to improve strength and stamina.                Discharge Exercise Prescription (Final Exercise Prescription Changes):  Exercise Prescription Changes - 05/12/22 1300       Response to Exercise   Blood Pressure (Admit) 128/62    Blood Pressure (Exit) 128/62    Heart Rate (Admit) 86 bpm    Heart Rate (Exercise) 106 bpm    Heart Rate (Exit) 85 bpm    Rating of Perceived Exertion (Exercise) 14    Symptoms none    Duration Continue with 30 min of aerobic exercise without signs/symptoms of physical distress.    Intensity THRR unchanged      Progression   Progression Continue to progress workloads to maintain intensity without signs/symptoms of physical distress.    Average METs 2.5      Resistance Training   Training Prescription Yes    Weight 3 lb    Reps 10-15      Interval Training    Interval Training No      Treadmill   MPH 2.1    Grade 1    Minutes 15    METs 2.9      Recumbant Bike   Level 4    Watts 26    Minutes 15      Recumbant Elliptical   Level 1    Minutes 15    METs 1.7      REL-XR   Level 2    Minutes 15      Home Exercise Plan   Plans to continue exercise at Home (comment)   walking   Frequency Add 3 additional days to program exercise sessions.    Initial Home Exercises Provided 03/25/22      Oxygen   Maintain Oxygen Saturation 88% or higher             Nutrition:  Target Goals: Understanding of nutrition guidelines, daily intake of sodium 1500mg , cholesterol 200mg , calories 30% from fat and 7% or less from saturated fats, daily to have 5 or more servings of fruits and vegetables.  Education: All About Nutrition: -Group instruction provided by verbal, written material, interactive activities, discussions, models, and posters to present general guidelines for heart healthy nutrition including fat, fiber, MyPlate, the role of sodium in heart healthy nutrition, utilization of the nutrition label, and utilization of this knowledge for meal planning. Follow up email sent as well. Written material given at graduation. Flowsheet Row Cardiac Rehab from 05/14/2022 in [facility] Cardiac and Pulmonary Rehab  Education need identified 02/24/22  Date 03/12/22  Educator [facility]  Instruction Review Code 1-  Verbalizes Understanding       Biometrics:  Pre Biometrics - 02/24/22 1026       Pre Biometrics   Height 5' 4.25" (1.632 m)    Weight 189 lb 12.8 oz (86.1 kg)    Waist Circumference 45 inches    Hip Circumference 45.5 inches    Waist to Hip Ratio 0.99 %    BMI (Calculated) 32.32    Single Leg Stand 14.6 seconds              Nutrition Therapy Plan and Nutrition Goals:  Nutrition Therapy & Goals - 02/24/22 0958       Nutrition Therapy   Diet Heart healthy, low Na, T2DM MNT    Drug/Food Interactions Coumadin/Vit K    Protein  (specify units) 75-85g    Fiber 28 grams    Whole Grain Foods 3 servings    Saturated Fats 15 max. grams    Fruits and Vegetables 8 servings/day    Sodium 2 grams      Personal Nutrition Goals   Nutrition Goal ST: switch to whole wheat bread, include fruits/vegetables at most meals using MyPlate as a guide, eat small and frequent meals with consistent CHO, eat consistent amounts of vitamin K daily LT: limit saturated fat <15g, achieve and maintain A1C <7, eat at least 28g of fiber per day, manage INR within recommended range by MD    Comments 62 y.o. F admitted to cardiac rehab s/p AVR. PMHx inlcudes diverticulosis, T2DM, former smoker, HTN, HLD, MI, CHF. Relevant medications includes coumadin, lantus, probiotic, ozempic. Recent A1C 01/05/22: 8.5. Chellie reports having a green house and garden, she reports they eat of a lot of fish as they go fishing often. Food recall includes: B: grits or oatmeal with eggs and bacon L: pimento cheese sandwich (honey-wheat) D: pizza, stirfry. Fish, chicken (baked) with skin (air frier), shrimp - olive oil, canola oil. or cereal (total).  S: ice cream (1 black cherry cone). Discussed T2DM MNT, ozempic meal planning (smaller more frequent meals recommended), heart healthy eating, and warfarin MNT.      Intervention Plan   Intervention Prescribe, educate and counsel regarding individualized specific dietary modifications aiming towards targeted core components such as weight, hypertension, lipid management, diabetes, heart failure and other comorbidities.;Nutrition handout(s) given to patient.    Expected Outcomes Short Term Goal: Understand basic principles of dietary content, such as calories, fat, sodium, cholesterol and nutrients.;Short Term Goal: A plan has been developed with personal nutrition goals set during dietitian appointment.;Long Term Goal: Adherence to prescribed nutrition plan.             Nutrition Assessments:  MEDIFICTS Score Key: ?70 Need to  make dietary changes  40-70 Heart Healthy Diet ? 40 Therapeutic Level Cholesterol Diet  Flowsheet Row Cardiac Rehab from 02/24/2022 in [facility] Cardiac and Pulmonary Rehab  Picture Your Plate Total Score on Admission 52      Picture Your Plate Scores: <16 Unhealthy dietary pattern with much room for improvement. 41-50 Dietary pattern unlikely to meet recommendations for good health and room for improvement. 51-60 More healthful dietary pattern, with some room for improvement.  >60 Healthy dietary pattern, although there may be some specific behaviors that could be improved.    Nutrition Goals Re-Evaluation:  Nutrition Goals Re-Evaluation     Row Name 03/10/22 1136 03/31/22 1112 04/28/22 1115         Goals   Nutrition Goal ST: switch to whole wheat bread, include fruits/vegetables at  most meals using MyPlate as a guide, eat small and frequent meals with consistent CHO, eat consistent amounts of vitamin K daily LT: limit saturated fat <15g, achieve and maintain A1C <7, eat at least 28g of fiber per day, manage INR within recommended range by MD ST: use MyPlate as a guide, consistent CHO and lower GI CHOs, eat consistent amounts of vitamin K daily LT: limit saturated fat <15g, achieve and maintain A1C <7, eat at least 28g of fiber per day, manage INR within recommended range by MD --     Comment Nancy [name] reports doing well with her nutrition and has been getting in more fruits/vegetables, but hasd been doing more fruit and dried fruit. She feels she may be having too much fruit, discussed pairing fruit with healthy fat and protein - she has it with nuts/seeds, but has multiple servings of fruit; discussed breaking up this snack to avoid having too mnay CHO at one time. Encouraged to continue with changes made. Nancy [name] reports doing well with her nutrition overall, but is having a hard time getting motivated to cook meals - sometimes she will go out for grilled chicken and salad, but for the most part  her husband has been cooking. discussed adding in easy vegetables like frozen vegetables to dinner and swapping to carbohydrates that wont effect her BG as much as it has been running higher since her hospital stay and being unable to get ozempic. Suggested carbohydrates like whole grains, red skin or sweet potatoes, beans/lentils more often than white rice, white potatoes, and corn. She reports snacking on a mix of chex cereal, nuts/seeds, and caramel corn - suggested limiting sodium and caramel corn. Nancy [name] reports doing well with her nutrition overall, but is having a hard time getting motivated to cook meals - sometimes she will go out for grilled chicken and salad, but for the most part her husband has been cooking. discussed adding in easy vegetables like frozen vegetables to dinner and swapping to carbohydrates that wont effect her BG as much as it has been running higher since her hospital stay and being unable to get ozempic. Suggested carbohydrates like whole grains, red skin or sweet potatoes, beans/lentils more often than white rice, white potatoes, and corn. She reports snacking on a mix of chex cereal, nuts/seeds, and caramel corn - suggested limiting sodium and caramel corn.     Expected Outcome ST: switch to whole wheat bread, include fruits/vegetables at most meals using MyPlate as a guide, eat small and frequent meals with consistent CHO, eat consistent amounts of vitamin K daily LT: limit saturated fat <15g, achieve and maintain A1C <7, eat at least 28g of fiber per day, manage INR within recommended range by MD ST: use MyPlate as a guide, consistent CHO and lower GI CHOs, eat consistent amounts of vitamin K daily LT: limit saturated fat <15g, achieve and maintain A1C <7, eat at least 28g of fiber per day, manage INR within recommended range by MD ST: use MyPlate as a guide, consistent CHO and lower GI CHOs, eat consistent amounts of vitamin K daily LT: limit saturated fat <15g, achieve and  maintain A1C <7, eat at least 28g of fiber per day, manage INR within recommended range by MD              Nutrition Goals Discharge (Final Nutrition Goals Re-Evaluation):  Nutrition Goals Re-Evaluation - 04/28/22 1115       Goals   Comment Nancy [name] reports doing well with her nutrition overall,  but is having a hard time getting motivated to cook meals - sometimes she will go out for grilled chicken and salad, but for the most part her husband has been cooking. discussed adding in easy vegetables like frozen vegetables to dinner and swapping to carbohydrates that wont effect her BG as much as it has been running higher since her hospital stay and being unable to get ozempic. Suggested carbohydrates like whole grains, red skin or sweet potatoes, beans/lentils more often than white rice, white potatoes, and corn. She reports snacking on a mix of chex cereal, nuts/seeds, and caramel corn - suggested limiting sodium and caramel corn.    Expected Outcome ST: use MyPlate as a guide, consistent CHO and lower GI CHOs, eat consistent amounts of vitamin K daily LT: limit saturated fat <15g, achieve and maintain A1C <7, eat at least 28g of fiber per day, manage INR within recommended range by MD             Psychosocial: Target Goals: Acknowledge presence or absence of significant depression and/or stress, maximize coping skills, provide positive support system. Participant is able to verbalize types and ability to use techniques and skills needed for reducing stress and depression.   Education: Stress, Anxiety, and Depression - Group verbal and visual presentation to define topics covered.  Reviews how body is impacted by stress, anxiety, and depression.  Also discusses healthy ways to reduce stress and to treat/manage anxiety and depression.  Written material given at graduation. Flowsheet Row Cardiac Rehab from 05/14/2022 in [facility] Cardiac and Pulmonary Rehab  Date 04/30/22  Educator [facility]  Instruction  Review Code 1- [employer] Understanding       Education: Sleep Hygiene -Provides group verbal and written instruction about how sleep can affect your health.  Define sleep hygiene, discuss sleep cycles and impact of sleep habits. Review good sleep hygiene tips.    Initial Review & Psychosocial Screening:  Initial Psych Review & Screening - 02/05/22 1443       Initial Review   Current issues with None Identified      Family Dynamics   Good Support System? Yes   husband, sisters     Barriers   Psychosocial barriers to participate in program There are no identifiable barriers or psychosocial needs.      Screening Interventions   Interventions Encouraged to exercise;Provide feedback about the scores to participant;To provide support and resources with identified psychosocial needs    Expected Outcomes Short Term goal: Utilizing psychosocial counselor, staff and physician to assist with identification of specific Stressors or current issues interfering with healing process. Setting desired goal for each stressor or current issue identified.;Long Term Goal: Stressors or current issues are controlled or eliminated.;Short Term goal: Identification and review with participant of any Quality of Life or Depression concerns found by scoring the questionnaire.;Long Term goal: The participant improves quality of Life and PHQ9 Scores as seen by post scores and/or verbalization of changes             Quality of Life Scores:   Quality of Life - 02/24/22 1005       Quality of Life   Select Quality of Life      Quality of Life Scores   Health/Function Pre 14.36 %    Socioeconomic Pre 15.25 %    Psych/Spiritual Pre 15 %    Family Pre 18 %    GLOBAL Pre 15.24 %            Scores  of 19 and below usually indicate a poorer quality of life in these areas.  A difference of  2-3 points is a clinically meaningful difference.  A difference of 2-3 points in the total score of the Quality of Life  Index has been associated with significant improvement in overall quality of life, self-image, physical symptoms, and general health in studies assessing change in quality of life.  PHQ-9: Review Flowsheet       04/28/2022 03/24/2022 02/24/2022  Depression screen PHQ 2/9  Decreased Interest 1 0 1  Down, Depressed, Hopeless 1 0 1  PHQ - 2 Score 2 0 2  Altered sleeping 3 1 2   Tired, decreased energy 3 1 1   Change in appetite 3 1 1   Feeling bad or failure about yourself  3 0 2  Trouble concentrating 2 1 1   Moving slowly or fidgety/restless 2 2 3   Suicidal thoughts 0 0 0  PHQ-9 Score 18 6 12   Difficult doing work/chores Somewhat difficult Somewhat difficult Somewhat difficult   Interpretation of Total Score  Total Score Depression Severity:  1-4 = Minimal depression, 5-9 = Mild depression, 10-14 = Moderate depression, 15-19 = Moderately severe depression, 20-27 = Severe depression   Psychosocial Evaluation and Intervention:  Psychosocial Evaluation - 02/05/22 1449       Psychosocial Evaluation & Interventions   Interventions Encouraged to exercise with the program and follow exercise prescription;Stress management education;Relaxation education    Comments Everly is coming to cardiac rehab after an AVR. She reports that there was quite a bit of pain, but it is easing off now. Her IV did infiltrate in the hospital and her arm is still sore. Her sleep habits are returning to normal. She does not express any stress concerns at this time, besided just the healing process. Her husband and sisters are her main support and he plans on coming with her to education. She wants to come to the program to learn more of what she should be doing to help her health.    Expected Outcomes Short: attend cardiac rehab for education and exericse. Long: develop and maintain positive self care habits.    Continue Psychosocial Services  Follow up required by staff             Psychosocial Re-Evaluation:   Psychosocial Re-Evaluation     Row Name 03/10/22 1148 03/24/22 1110 03/31/22 1124 04/28/22 1116       Psychosocial Re-Evaluation   Current issues with Current Stress Concerns Current Stress Concerns;Current Depression Current Stress Concerns;Current Depression Current Stress Concerns;Current Depression    Comments Nancy [name] reports that her husband is a good support system for her and her sister as well, but to a lesser capacity. She reports being stressed for her son, but did not want to discuss further at this time. She reports walking, reading the bible, and playing wordscape to help with stress reduction. She reports sleeping well now without restless leg. Nancy [name] has improved her PHQ from 12 down to 6.  She has changed up her medication timing and is sleeping better and feeling better.  She has also enjoyed  having family around this week for Thanksgiving!! Nancy [name] reports no major stress concerns at this time - she relies on her husband for support. She takes CBD at night to help with sleep. She reports walking, reading the bible, and playing wordscape to help with stress reduction. Nancy [name] reports that her mental health has not been doing well as she and her husband have been  dealing with some health issues. She was diagnosed with a UTI about a week ago, but has started to feel better. She states that she enjoys walking, reading the Bible, and playing wordscape to help with stress reduction. She reports having a good support system at home made up primarily by her husband, her sister, and her oldest daughter.    Expected Outcomes ST: continue with stress reducing habits LT: manage stress by utilizing tools and support system SHort: Conitnue to focus on positive Long: Continue to take care of herself SHort: Conitnue to attend cardiac rehab for mental health boost Long: Continue to focus on positive Short: Conitnue to attend cardiac rehab for mental health boost. Long: Continue to maintain positive outlook.     Interventions Encouraged to attend Cardiac Rehabilitation for the exercise Stress management education;Encouraged to attend Cardiac Rehabilitation for the exercise Stress management education;Encouraged to attend Cardiac Rehabilitation for the exercise Stress management education;Encouraged to attend Cardiac Rehabilitation for the exercise    Continue Psychosocial Services  Follow up required by staff Follow up required by staff Follow up required by staff Follow up required by staff             Psychosocial Discharge (Final Psychosocial Re-Evaluation):  Psychosocial Re-Evaluation - 04/28/22 1116       Psychosocial Re-Evaluation   Current issues with Current Stress Concerns;Current Depression    Comments Nancy [name] reports that her mental health has not been doing well as she and her husband have been dealing with some health issues. She was diagnosed with a UTI about a week ago, but has started to feel better. She states that she enjoys walking, reading the Bible, and playing wordscape to help with stress reduction. She reports having a good support system at home made up primarily by her husband, her sister, and her oldest daughter.    Expected Outcomes Short: Conitnue to attend cardiac rehab for mental health boost. Long: Continue to maintain positive outlook.    Interventions Stress management education;Encouraged to attend Cardiac Rehabilitation for the exercise    Continue Psychosocial Services  Follow up required by staff             Vocational Rehabilitation: Provide vocational rehab assistance to qualifying candidates.   Vocational Rehab Evaluation & Intervention:  Vocational Rehab - 02/05/22 1443       Initial Vocational Rehab Evaluation & Intervention   Assessment shows need for Vocational Rehabilitation No             Education: Education Goals: Education classes will be provided on a variety of topics geared toward better understanding of heart health and risk  factor modification. Participant will state understanding/return demonstration of topics presented as noted by education test scores.  Learning Barriers/Preferences:  Learning Barriers/Preferences - 02/05/22 1443       Learning Barriers/Preferences   Learning Barriers None    Learning Preferences Individual Instruction             General Cardiac Education Topics:  AED/CPR: - Group verbal and written instruction with the use of models to demonstrate the basic use of the AED with the basic ABC's of resuscitation.   Anatomy and Cardiac Procedures: - Group verbal and visual presentation and models provide information about basic cardiac anatomy and function. Reviews the testing methods done to diagnose heart disease and the outcomes of the test results. Describes the treatment choices: Medical Management, Angioplasty, or Coronary Bypass Surgery for treating various heart conditions including Myocardial Infarction, Angina, Valve Disease,  and Cardiac Arrhythmias.  Written material given at graduation. Flowsheet Row Cardiac Rehab from 05/14/2022 in [facility] Cardiac and Pulmonary Rehab  Education need identified 02/24/22  Date 04/02/22  Educator SB  Instruction Review Code 1- Verbalizes Understanding       Medication Safety: - Group verbal and visual instruction to review commonly prescribed medications for heart and lung disease. Reviews the medication, class of the drug, and side effects. Includes the steps to properly store meds and maintain the prescription regimen.  Written material given at graduation. Flowsheet Row Cardiac Rehab from 05/14/2022 in [facility] Cardiac and Pulmonary Rehab  Date 04/09/22  Educator SB  Instruction Review Code 1- Verbalizes Understanding       Intimacy: - Group verbal instruction through game format to discuss how heart and lung disease can affect sexual intimacy. Written material given at graduation.. Flowsheet Row Cardiac Rehab from 05/14/2022 in [facility]  Cardiac and Pulmonary Rehab  Date 05/14/22  Educator [facility]  Instruction Review Code 1- Verbalizes Understanding       Know Your Numbers and Heart Failure: - Group verbal and visual instruction to discuss disease risk factors for cardiac and pulmonary disease and treatment options.  Reviews associated critical values for Overweight/Obesity, Hypertension, Cholesterol, and Diabetes.  Discusses basics of heart failure: signs/symptoms and treatments.  Introduces Heart Failure Zone chart for action plan for heart failure.  Written material given at graduation. Flowsheet Row Cardiac Rehab from 05/14/2022 in [facility] Cardiac and Pulmonary Rehab  Date 04/16/22  Educator SB  Instruction Review Code 1- Verbalizes Understanding       Infection Prevention: - Provides verbal and written material to individual with discussion of infection control including proper hand washing and proper equipment cleaning during exercise session. Flowsheet Row Cardiac Rehab from 05/14/2022 in [facility] Cardiac and Pulmonary Rehab  Date 02/24/22  Educator NT  Instruction Review Code 1- Verbalizes Understanding       Falls Prevention: - Provides verbal and written material to individual with discussion of falls prevention and safety. Flowsheet Row Cardiac Rehab from 05/14/2022 in [facility] Cardiac and Pulmonary Rehab  Date 02/24/22  Educator NT  Instruction Review Code 1- Verbalizes Understanding       Other: -Provides group and verbal instruction on various topics (see comments)   Knowledge Questionnaire Score:  Knowledge Questionnaire Score - 02/24/22 0948       Knowledge Questionnaire Score   Pre Score 22/26             Core Components/Risk Factors/Patient Goals at Admission:  Personal Goals and Risk Factors at Admission - 02/24/22 0953       Core Components/Risk Factors/Patient Goals on Admission    Weight Management Yes;Weight Loss    Intervention Weight Management: Develop a combined nutrition and  exercise program designed to reach desired caloric intake, while maintaining appropriate intake of nutrient and fiber, sodium and fats, and appropriate energy expenditure required for the weight goal.;Weight Management: Provide education and appropriate resources to help participant work on and attain dietary goals.    Admit Weight 189 lb 12.8 oz (86.1 kg)    Goal Weight: Short Term 184 lb (83.5 kg)    Goal Weight: Long Term 179 lb (81.2 kg)    Expected Outcomes Short Term: Continue to assess and modify interventions until short term weight is achieved;Long Term: Adherence to nutrition and physical activity/exercise program aimed toward attainment of established weight goal;Weight Loss: Understanding of general recommendations for a balanced deficit meal plan, which promotes 1-2 lb weight  loss per week and includes a negative energy balance of [phone] kcal/d;Understanding recommendations for meals to include 15-35% energy as protein, 25-35% energy from fat, 35-60% energy from carbohydrates, less than 200mg  of dietary cholesterol, 20-35 gm of total fiber daily;Understanding of distribution of calorie intake throughout the day with the consumption of 4-5 meals/snacks;Weight Gain: Understanding of general recommendations for a high calorie, high protein meal plan that promotes weight gain by distributing calorie intake throughout the day with the consumption for 4-5 meals, snacks, and/or supplements    Diabetes Yes    Intervention Provide education about signs/symptoms and action to take for hypo/hyperglycemia.;Provide education about proper nutrition, including hydration, and aerobic/resistive exercise prescription along with prescribed medications to achieve blood glucose in normal ranges: Fasting glucose 65-99 mg/dL    Expected Outcomes Short Term: Participant verbalizes understanding of the signs/symptoms and immediate care of hyper/hypoglycemia, proper foot care and importance of medication,  aerobic/resistive exercise and nutrition plan for blood glucose control.;Long Term: Attainment of HbA1C < 7%.    Hypertension Yes    Intervention Provide education on lifestyle modifcations including regular physical activity/exercise, weight management, moderate sodium restriction and increased consumption of fresh fruit, vegetables, and low fat dairy, alcohol moderation, and smoking cessation.;Monitor prescription use compliance.    Expected Outcomes Short Term: Continued assessment and intervention until BP is < 140/21mm HG in hypertensive participants. < 130/82mm HG in hypertensive participants with diabetes, heart failure or chronic kidney disease.;Long Term: Maintenance of blood pressure at goal levels.    Lipids Yes    Intervention Provide education and support for participant on nutrition & aerobic/resistive exercise along with prescribed medications to achieve LDL 70mg , HDL >40mg .    Expected Outcomes Short Term: Participant states understanding of desired cholesterol values and is compliant with medications prescribed. Participant is following exercise prescription and nutrition guidelines.;Long Term: Cholesterol controlled with medications as prescribed, with individualized exercise RX and with personalized nutrition plan. Value goals: LDL < 70mg , HDL > 40 mg.             Education:Diabetes - Individual verbal and written instruction to review signs/symptoms of diabetes, desired ranges of glucose level fasting, after meals and with exercise. Acknowledge that pre and post exercise glucose checks will be done for 3 sessions at entry of program. Flowsheet Row Cardiac Rehab from 05/14/2022 in [facility] Cardiac and Pulmonary Rehab  Date 02/05/22  Educator [facility]  Instruction Review Code 1- Verbalizes Understanding       Core Components/Risk Factors/Patient Goals Review:   Goals and Risk Factor Review     Row Name 03/10/22 1144 03/10/22 1147 03/31/22 1113 04/28/22 1122       Core  Components/Risk Factors/Patient Goals Review   Personal Goals Review Diabetes;Hypertension Diabetes;Hypertension Diabetes;Hypertension Diabetes;Hypertension    Review Lacretia reports that her BG and BP was much more elevated since her hospital admission. She started taking her long acting insulin at night instead of during the day as it was giving her restless legs- encouraged her to let her doctor know - she sees her MD 11/31/23, advised contacting office before then to let them know. Her BG has been over 300 most mornings - discussed nutrition with RD, working on changes to diet. Jemiah reports her BP has been around 150/80 at rest which is higher than her normal - she reports her MD wanted her to start back on Losartin, but did not give her a prescription - encouraged her to reach back out to her cardiologist regarding this. -- Nancy [name].  reports still working with her wound from surgery - she is working with her MD to help it heal - she was refered to a wound clinic, but does not know when they will call her; she uses a silver sulfate cream to help with debridement as well as antibiotic salve. She reports that her wounds are getting better and healing, but it is getting more sore. Right now her foot is also acting up; she has broken her toe, she brought it up to her podiatrist and they recommended toe straighteners to avoid amputation; she reports it is doing ok, but is still tender. She reports her BG was 293 this morning - she continues to work on her nutrition. She reports her BP has been running up and down - she feels it gets higher from her snacking- she is going to try adding less salt to her snack mix. She continues to take her medication as directed with no issues, however, they have been adjusting her warfarin. Additionally, she is waiting for more ozempic. Nancy [name] reports that her wound from surgery is healing well as it is no longer open. She hasbeen checking her BP at home and states that it has been  within normal ranges. However, she reports that her blood glucose levels have been all over the place, although she has been taking all her medications as prescribed. She also has been prescribed antibiotics for her UTI that she has been taking as well.    Expected Outcomes ST: contact MD regarding insulin switch and follow up regarding Losartin LT: manage risk factors -- ST: continue to work on diet and home exercise LT: continue to monitor risk factors Short: continue to take antibiotics to recover from UTI. Long: continue to manage lifestyle risk factors.             Core Components/Risk Factors/Patient Goals at Discharge (Final Review):   Goals and Risk Factor Review - 04/28/22 1122       Core Components/Risk Factors/Patient Goals Review   Personal Goals Review Diabetes;Hypertension    Review Nancy [name] reports that her wound from surgery is healing well as it is no longer open. She hasbeen checking her BP at home and states that it has been within normal ranges. However, she reports that her blood glucose levels have been all over the place, although she has been taking all her medications as prescribed. She also has been prescribed antibiotics for her UTI that she has been taking as well.    Expected Outcomes Short: continue to take antibiotics to recover from UTI. Long: continue to manage lifestyle risk factors.             ITP Comments:  ITP Comments     Row Name 02/05/22 1453 02/24/22 0947 02/25/22 0946 03/03/22 1149 03/25/22 0813   ITP Comments Initial phone call completed. Diagnosis can be found in [facility] 9/11. EP Orientation scheduled for Monday 10/23 at 1:30. Completed and gym orientation. Initial ITP created and sent for review to Dr. Bethann Punches, Medical Director. 30 Day review completed. Medical Director ITP review done, changes made as directed, and signed approval by Medical Director.   NEW TO PROGRAM First full day of exercise!  Patient was oriented to gym and equipment  including functions, settings, policies, and procedures.  Patient's individual exercise prescription and treatment plan were reviewed.  All starting workloads were established based on the results of the 6 minute walk test done at initial orientation visit.  The plan for exercise  progression was also introduced and progression will be customized based on patient's performance and goals. 30 Day review completed. Medical Director ITP review done, changes made as directed, and signed approval by Medical Director.   New to program    Row Name 04/22/22 1025 05/05/22 1151 05/20/22 0944       ITP Comments 30 Day review completed. Medical Director ITP review done, changes made as directed, and signed approval by Medical Director. Nancy [name] told staff she had  stopped  her exercise because she had 9/10 chest oain. The poain resolved as soon as she stopped. No more episodes this session.  She was advised to call MD if the episodes increase in frequency or severity.  To call 911 if she has symptoms that won't resolve. She verbalized understanding. 30 Day review completed. Medical Director ITP review done, changes made as directed, and signed approval by Medical Director.   has returned to exercise              Comments:

## 2022-05-20 NOTE — Progress Notes (Signed)
Daily Session Note  Patient Details  Name: Nancy [name] MRN: [id number] Date of Birth: [birth year] Referring Provider:   Flowsheet Row Cardiac Rehab from 02/24/2022 in [facility] Cardiac and Pulmonary Rehab  Referring Provider Serafina Royals MD       Encounter Date: 05/20/2022  Check In:  Session Check In - 05/20/22 1106       Check-In   Supervising physician immediately available to respond to emergencies See telemetry face sheet for immediately available ER MD    Location [facility]-Cardiac & Pulmonary Rehab    Staff Present Darlyne Russian, RN, ADN;Meredith Sherryll Burger, RN BSN;Joseph Hood, RCP,RRT,BSRT;Noah Tickle, BS, Exercise Physiologist    Virtual Visit No    Fall or balance concerns reported    No    Warm-up and Cool-down Performed on first and last piece of equipment    Resistance Training Performed Yes    VAD Patient? No    PAD/SET Patient? No      Pain Assessment   Currently in Pain? No/denies                Social History   Tobacco Use  Smoking Status Former   Packs/day: 0.25   Years: 13.00   Total pack years: 3.25   Types: Cigarettes  Smokeless Tobacco Never  Tobacco Comments   age [age]    Goals Met:  Independence with exercise equipment Exercise tolerated well No report of concerns or symptoms today Strength training completed today  Goals Unmet:  Not Applicable  Comments: Pt able to follow exercise prescription today without complaint.  Will continue to monitor for progression.    Dr. Emily Filbert is Medical Director for [place].  Dr. Ottie Glazier is Medical Director for [facility] Pulmonary Rehabilitation.

## 2022-05-26 ENCOUNTER — Encounter: Payer: Medicaid Other | Admitting: *Deleted

## 2022-05-26 VITALS — Ht 64.25 in | Wt 190.8 lb

## 2022-05-26 DIAGNOSIS — Z952 Presence of prosthetic heart valve: Secondary | ICD-10-CM

## 2022-05-26 NOTE — Patient Instructions (Signed)
Discharge Patient Instructions  Patient Details  Name: Nancy [name] MRN: [id number] Date of Birth: [birth year] Referring Provider:  Denton Lank, MD   Number of Visits: 36  Reason for Discharge:  Patient reached a stable level of exercise. Patient independent in their exercise. Patient has met program and personal goals.  Diagnosis:  S/P AVR (aortic valve replacement)  Initial Exercise Prescription:  Initial Exercise Prescription - 02/24/22 1000       Date of Initial Exercise RX and Referring Provider   Date 02/24/22    Referring Provider Serafina Royals MD      Oxygen   Maintain Oxygen Saturation 88% or higher      Treadmill   MPH 1.8    Grade 0.5    Minutes 15    METs 2.5      NuStep   Level 2    SPM 80    Minutes 15    METs 2.82      Recumbant Elliptical   Level 1    RPM 50    Watts 23    Minutes 15    METs 2.82      T5 Nustep   Level 1    SPM 80    Minutes 15    METs 2.82      Prescription Details   Frequency (times per week) 2    Duration Progress to 30 minutes of continuous aerobic without signs/symptoms of physical distress      Intensity   THRR 40-80% of Max Heartrate 117-144    Ratings of Perceived Exertion 11-13    Perceived Dyspnea 0-4      Progression   Progression Continue to progress workloads to maintain intensity without signs/symptoms of physical distress.      Resistance Training   Training Prescription Yes    Weight 3 lb    Reps 10-15             Discharge Exercise Prescription (Final Exercise Prescription Changes):  Exercise Prescription Changes - 05/12/22 1300       Response to Exercise   Blood Pressure (Admit) 128/62    Blood Pressure (Exit) 128/62    Heart Rate (Admit) 86 bpm    Heart Rate (Exercise) 106 bpm    Heart Rate (Exit) 85 bpm    Rating of Perceived Exertion (Exercise) 14    Symptoms none    Duration Continue with 30 min of aerobic exercise without signs/symptoms of physical distress.     Intensity THRR unchanged      Progression   Progression Continue to progress workloads to maintain intensity without signs/symptoms of physical distress.    Average METs 2.5      Resistance Training   Training Prescription Yes    Weight 3 lb    Reps 10-15      Interval Training   Interval Training No      Treadmill   MPH 2.1    Grade 1    Minutes 15    METs 2.9      Recumbant Bike   Level 4    Watts 26    Minutes 15      Recumbant Elliptical   Level 1    Minutes 15    METs 1.7      REL-XR   Level 2    Minutes 15      Home Exercise Plan   Plans to continue exercise at Home (comment)   walking   Frequency Add 3 additional  days to program exercise sessions.    Initial Home Exercises Provided 03/25/22      Oxygen   Maintain Oxygen Saturation 88% or higher             Functional Capacity:  6 Minute Walk     Row Name 02/24/22 1006 05/26/22 1123       6 Minute Walk   Phase Initial Discharge    Distance 1085 feet 1310 feet    Distance % Change -- 20.7 %    Distance Feet Change -- 225 ft    Walk Time 6 minutes 6 minutes    # of Rest Breaks 0 0    MPH 2.05 2.48    METS 2.82 3.05    RPE 7 12    Perceived Dyspnea  0 1    VO2 Peak 9.87 10.68    Symptoms Yes (comment) No    Comments Hip Pain 8/10 --    Resting HR 90 bpm 87 bpm    Resting BP 142/78 124/72    Resting Oxygen Saturation  97 % 95 %    Exercise Oxygen Saturation  during 6 min walk 95 % 97 %    Max Ex. HR 107 bpm 102 bpm    Max Ex. BP 144/84 138/78    2 Minute Post BP 140/78 --             Nutrition & Weight - Outcomes:  Pre Biometrics - 02/24/22 1026       Pre Biometrics   Height 5' 4.25" (1.632 m)    Weight 189 lb 12.8 oz (86.1 kg)    Waist Circumference 45 inches    Hip Circumference 45.5 inches    Waist to Hip Ratio 0.99 %    BMI (Calculated) 32.32    Single Leg Stand 14.6 seconds             Post Biometrics - 05/26/22 1126        Post  Biometrics   Height 5'  4.25" (1.632 m)    Weight 190 lb 12.8 oz (86.5 kg)    Waist Circumference 44 inches    Hip Circumference 44.5 inches    Waist to Hip Ratio 0.99 %    BMI (Calculated) 32.49    Single Leg Stand 30 seconds             Nutrition:  Nutrition Therapy & Goals - 02/24/22 0958       Nutrition Therapy   Diet Heart healthy, low Na, T2DM MNT    Drug/Food Interactions Coumadin/Vit K    Protein (specify units) 75-85g    Fiber 28 grams    Whole Grain Foods 3 servings    Saturated Fats 15 max. grams    Fruits and Vegetables 8 servings/day    Sodium 2 grams      Personal Nutrition Goals   Nutrition Goal ST: switch to whole wheat bread, include fruits/vegetables at most meals using MyPlate as a guide, eat small and frequent meals with consistent CHO, eat consistent amounts of vitamin K daily LT: limit saturated fat <15g, achieve and maintain A1C <7, eat at least 28g of fiber per day, manage INR within recommended range by MD    Comments 62 y.o. F admitted to cardiac rehab s/p AVR. PMHx inlcudes diverticulosis, T2DM, former smoker, HTN, HLD, MI, CHF. Relevant medications includes coumadin, lantus, probiotic, ozempic. Recent A1C 01/05/22: 8.5. Nancy [name] reports having a green house and garden, she reports they eat of  a lot of fish as they go fishing often. Food recall includes: B: grits or oatmeal with eggs and bacon L: pimento cheese sandwich (honey-wheat) D: pizza, stirfry. Fish, chicken (baked) with skin (air frier), shrimp - olive oil, canola oil. or cereal (total).  S: ice cream (1 black cherry cone). Discussed T2DM MNT, ozempic meal planning (smaller more frequent meals recommended), heart healthy eating, and warfarin MNT.      Intervention Plan   Intervention Prescribe, educate and counsel regarding individualized specific dietary modifications aiming towards targeted core components such as weight, hypertension, lipid management, diabetes, heart failure and other comorbidities.;Nutrition handout(s)  given to patient.    Expected Outcomes Short Term Goal: Understand basic principles of dietary content, such as calories, fat, sodium, cholesterol and nutrients.;Short Term Goal: A plan has been developed with personal nutrition goals set during dietitian appointment.;Long Term Goal: Adherence to prescribed nutrition plan.

## 2022-05-26 NOTE — Progress Notes (Signed)
Daily Session Note  Patient Details  Name: Nancy [name] MRN: [id number] Date of Birth: 06-Aug-1960 Referring Provider:   Flowsheet Row Cardiac Rehab from 02/24/2022 in [facility] Cardiac and Pulmonary Rehab  Referring Provider Serafina Royals MD       Encounter Date: 05/26/2022  Check In:  Session Check In - 05/26/22 1108       Check-In   Supervising physician immediately available to respond to emergencies See telemetry face sheet for immediately available ER MD    Location [facility]-Cardiac & Pulmonary Rehab    Staff Present Renita Papa, RN BSN;Jessica Luan Pulling, MA, RCEP, CCRP, CCET;Noah Tickle, BS, Exercise Physiologist    Virtual Visit No    Medication changes reported     No    Fall or balance concerns reported    No    Warm-up and Cool-down Performed on first and last piece of equipment    Resistance Training Performed Yes    VAD Patient? No    PAD/SET Patient? No      Pain Assessment   Currently in Pain? No/denies                Social History   Tobacco Use  Smoking Status Former   Packs/day: 0.25   Years: 13.00   Total pack years: 3.25   Types: Cigarettes  Smokeless Tobacco Never  Tobacco Comments   age [age]    Goals Met:  Independence with exercise equipment Exercise tolerated well No report of concerns or symptoms today Strength training completed today  Goals Unmet:  Not Applicable  Comments: Pt able to follow exercise prescription today without complaint.  Will continue to monitor for progression.    Dr. Emily Filbert is Medical Director for [place].  Dr. Ottie Glazier is Medical Director for [facility] Pulmonary Rehabilitation.

## 2022-05-28 ENCOUNTER — Encounter: Payer: Medicaid Other | Attending: Internal Medicine | Admitting: *Deleted

## 2022-05-28 DIAGNOSIS — Z952 Presence of prosthetic heart valve: Secondary | ICD-10-CM | POA: Diagnosis not present

## 2022-05-28 NOTE — Progress Notes (Signed)
Daily Session Note  Patient Details  Name: Nancy [name] MRN: [id number] Date of Birth: 05-09-1960 Referring Provider:   Flowsheet Row Cardiac Rehab from 02/24/2022 in [facility] Cardiac and Pulmonary Rehab  Referring Provider Serafina Royals MD       Encounter Date: 05/28/2022  Check In:  Session Check In - 05/28/22 1112       Check-In   Supervising physician immediately available to respond to emergencies See telemetry face sheet for immediately available ER MD    Location [facility]-Cardiac & Pulmonary Rehab    Staff Present Darlyne Russian, RN, ADN;Meredith Sherryll Burger, RN Abel Presto, MS, ACSM CEP, Exercise Physiologist;Joseph Tessie Fass, [place]    Virtual Visit No    Medication changes reported     No    Fall or balance concerns reported    No    Warm-up and Cool-down Performed on first and last piece of equipment    Resistance Training Performed Yes    VAD Patient? No    PAD/SET Patient? No      Pain Assessment   Currently in Pain? No/denies                Social History   Tobacco Use  Smoking Status Former   Packs/day: 0.25   Years: 13.00   Total pack years: 3.25   Types: Cigarettes  Smokeless Tobacco Never  Tobacco Comments   age [age]    Goals Met:  Independence with exercise equipment Exercise tolerated well No report of concerns or symptoms today Strength training completed today  Goals Unmet:  Not Applicable  Comments: Pt able to follow exercise prescription today without complaint.  Will continue to monitor for progression.    Dr. Emily Filbert is Medical Director for [place].  Dr. Ottie Glazier is Medical Director for [facility] Pulmonary Rehabilitation.

## 2022-06-02 ENCOUNTER — Encounter: Payer: Medicaid Other | Admitting: *Deleted

## 2022-06-02 DIAGNOSIS — Z952 Presence of prosthetic heart valve: Secondary | ICD-10-CM | POA: Diagnosis not present

## 2022-06-02 NOTE — Progress Notes (Signed)
Daily Session Note  Patient Details  Name: Nancy [name] MRN: [id number] Date of Birth: Mar 01, 1961 Referring Provider:   Flowsheet Row Cardiac Rehab from 02/24/2022 in [facility] Cardiac and Pulmonary Rehab  Referring Provider Serafina Royals MD       Encounter Date: 06/02/2022  Check In:  Session Check In - 06/02/22 1056       Check-In   Supervising physician immediately available to respond to emergencies See telemetry face sheet for immediately available ER MD    Location [facility]-Cardiac & Pulmonary Rehab    Staff Present Renita Papa, RN BSN;Joseph Tessie Fass, RCP,RRT,BSRT;Kara Maricela Bo, MS, ACSM CEP, Exercise Physiologist    Virtual Visit No    Medication changes reported     No    Fall or balance concerns reported    No    Warm-up and Cool-down Performed on first and last piece of equipment    Resistance Training Performed Yes    VAD Patient? No    PAD/SET Patient? No      Pain Assessment   Currently in Pain? No/denies                Social History   Tobacco Use  Smoking Status Former   Packs/day: 0.25   Years: 13.00   Total pack years: 3.25   Types: Cigarettes  Smokeless Tobacco Never  Tobacco Comments   age [age]    Goals Met:  Independence with exercise equipment Exercise tolerated well No report of concerns or symptoms today Strength training completed today  Goals Unmet:  Not Applicable  Comments: Pt able to follow exercise prescription today without complaint.  Will continue to monitor for progression.    Dr. Emily Filbert is Medical Director for [place].  Dr. Ottie Glazier is Medical Director for [facility] Pulmonary Rehabilitation.

## 2022-06-04 ENCOUNTER — Encounter: Payer: Medicaid Other | Admitting: *Deleted

## 2022-06-04 DIAGNOSIS — Z952 Presence of prosthetic heart valve: Secondary | ICD-10-CM

## 2022-06-04 NOTE — Progress Notes (Signed)
Daily Session Note  Patient Details  Name: Nancy [name] MRN: [id number] Date of Birth: Dec 16, 1960 Referring Provider:   Flowsheet Row Cardiac Rehab from 02/24/2022 in [facility] Cardiac and Pulmonary Rehab  Referring Provider Serafina Royals MD       Encounter Date: 06/04/2022  Check In:  Session Check In - 06/04/22 1037       Check-In   Supervising physician immediately available to respond to emergencies See telemetry face sheet for immediately available ER MD    Location [facility]-Cardiac & Pulmonary Rehab    Staff Present Renita Papa, RN BSN;Noah Tickle, BS, Exercise Physiologist;Kara Maricela Bo, MS, ACSM CEP, Exercise Physiologist    Virtual Visit No    Medication changes reported     No    Fall or balance concerns reported    No    Warm-up and Cool-down Performed on first and last piece of equipment    Resistance Training Performed Yes    VAD Patient? No    PAD/SET Patient? No      Pain Assessment   Currently in Pain? No/denies                Social History   Tobacco Use  Smoking Status Former   Packs/day: 0.25   Years: 13.00   Total pack years: 3.25   Types: Cigarettes  Smokeless Tobacco Never  Tobacco Comments   age [age]    Goals Met:  Independence with exercise equipment Exercise tolerated well No report of concerns or symptoms today Strength training completed today  Goals Unmet:  Not Applicable  Comments: Pt able to follow exercise prescription today without complaint.  Will continue to monitor for progression.    Dr. Emily Filbert is Medical Director for [place].  Dr. Ottie Glazier is Medical Director for [facility] Pulmonary Rehabilitation.

## 2022-06-09 ENCOUNTER — Encounter: Payer: Medicaid Other | Admitting: *Deleted

## 2022-06-09 DIAGNOSIS — Z952 Presence of prosthetic heart valve: Secondary | ICD-10-CM | POA: Diagnosis not present

## 2022-06-09 NOTE — Progress Notes (Signed)
Daily Session Note  Patient Details  Name: Nancy [name] MRN: [id number] Date of Birth: April 22, 1961 Referring Provider:   Flowsheet Row Cardiac Rehab from 02/24/2022 in [facility] Cardiac and Pulmonary Rehab  Referring Provider Serafina Royals MD       Encounter Date: 06/09/2022  Check In:  Session Check In - 06/09/22 1108       Check-In   Supervising physician immediately available to respond to emergencies See telemetry face sheet for immediately available ER MD    Location [facility]-Cardiac & Pulmonary Rehab    Staff Present Renita Papa, RN BSN;Noah Tickle, BS, Exercise Physiologist;Jessica [place], MA, RCEP, CCRP, Bertram Gala, MS, ACSM CEP, Exercise Physiologist    Virtual Visit No    Medication changes reported     No    Fall or balance concerns reported    No    Warm-up and Cool-down Performed on first and last piece of equipment    Resistance Training Performed Yes    VAD Patient? No    PAD/SET Patient? No      Pain Assessment   Currently in Pain? No/denies                Social History   Tobacco Use  Smoking Status Former   Packs/day: 0.25   Years: 13.00   Total pack years: 3.25   Types: Cigarettes  Smokeless Tobacco Never  Tobacco Comments   age [age]    Goals Met:  Independence with exercise equipment Exercise tolerated well No report of concerns or symptoms today Strength training completed today  Goals Unmet:  Not Applicable  Comments:  Nancy [name] graduated today from  rehab with 35 sessions completed.  Details of the patient's exercise prescription and what She needs to do in order to continue the prescription and progress were discussed with patient.  Patient was given a copy of prescription and goals.  Patient verbalized understanding.  Nancy [name] plans to continue to exercise by walking.    Dr. Emily Filbert is Medical Director for [place].  Dr. Ottie Glazier is Medical Director for [facility] Pulmonary  Rehabilitation.

## 2022-06-09 NOTE — Progress Notes (Signed)
Discharge Summary Retaj Koke  DOB: 06/05/1960  Jaelle graduated today from  rehab with 35 sessions completed.  Details of the patient's exercise prescription and what She needs to do in order to continue the prescription and progress were discussed with patient.  Patient was given a copy of prescription and goals.  Patient verbalized understanding.  Autie plans to continue to exercise by walking.   [place] Name 02/24/22 1006 05/26/22 1123       6 Minute Walk   Phase Initial Discharge    Distance 1085 feet 1310 feet    Distance % Change -- 20.7 %    Distance Feet Change -- 225 ft    Walk Time 6 minutes 6 minutes    # of Rest Breaks 0 0    MPH 2.05 2.48    METS 2.82 3.05    RPE 7 12    Perceived Dyspnea  0 1    VO2 Peak 9.87 10.68    Symptoms Yes (comment) No    Comments Hip Pain 8/10 --    Resting HR 90 bpm 87 bpm    Resting BP 142/78 124/72    Resting Oxygen Saturation  97 % 95 %    Exercise Oxygen Saturation  during 6 min walk 95 % 97 %    Max Ex. HR 107 bpm 102 bpm    Max Ex. BP 144/84 138/78    2 Minute Post BP 140/78 --

## 2022-06-09 NOTE — Progress Notes (Signed)
Cardiac Individual Treatment Plan  Patient Details  Name: Nancy [name] MRN: [id number] Date of Birth: [birth year] Referring Provider:   Flowsheet Row Cardiac Rehab from 02/24/2022 in [facility] Cardiac and Pulmonary Rehab  Referring Provider Serafina Royals MD       Initial Encounter Date:  Flowsheet Row Cardiac Rehab from 02/24/2022 in [facility] Cardiac and Pulmonary Rehab  Date 02/24/22       Visit Diagnosis: S/P AVR (aortic valve replacement)  Patient's Home Medications on Admission:  Current Outpatient Medications:    acetaminophen-codeine (TYLENOL #3) 300-30 MG tablet, Take 2 tablets by mouth at bedtime., Disp: , Rfl: 0   albuterol (PROVENTIL HFA;VENTOLIN HFA) 108 (90 Base) MCG/ACT inhaler, Inhale 2 puffs into the lungs every 6 (six) hours as needed for wheezing or shortness of breath., Disp: , Rfl:    amLODipine (NORVASC) 10 MG tablet, Take 10 mg by mouth every morning. , Disp: , Rfl: 5   aspirin EC 81 MG tablet, Take 81 mg by mouth daily., Disp: , Rfl:    CINNAMON PO, Take 2 capsules by mouth 2 (two) times daily., Disp: , Rfl:    diclofenac sodium (VOLTAREN) 1 % GEL, Apply 1 application topically daily as needed (for pain)., Disp: , Rfl:    furosemide (LASIX) 40 MG tablet, Take 1 tablet (40 mg total) by mouth daily., Disp: 30 tablet, Rfl: 11   gabapentin (NEURONTIN) 300 MG capsule, Take 300 mg by mouth at bedtime., Disp: , Rfl: 3   gabapentin (NEURONTIN) 300 MG capsule, Take 1 capsule (300 mg total) by mouth 3 (three) times daily for 21 days., Disp: 63 capsule, Rfl: 0   GARCINIA CAMBOGIA-CHROMIUM PO, Take 2 capsules by mouth daily., Disp: , Rfl:    JARDIANCE 25 MG TABS tablet, Take 25 mg by mouth daily., Disp: , Rfl:    LANTUS SOLOSTAR 100 UNIT/ML Solostar Pen, Inject 38 Units into the skin at bedtime., Disp: , Rfl: 3   losartan (COZAAR) 100 MG tablet, Take 100 mg by mouth every morning. , Disp: , Rfl: 5   metoprolol succinate (TOPROL-XL) 50 MG 24 hr tablet, Take 50 mg by mouth  every morning. , Disp: , Rfl: 2   montelukast (SINGULAIR) 10 MG tablet, Take 10 mg by mouth daily., Disp: , Rfl:    nystatin cream (MYCOSTATIN), Apply topically., Disp: , Rfl:    OZEMPIC, 2 MG/DOSE, 8 MG/3ML SOPN, Inject into the skin., Disp: , Rfl:    Probiotic Product (PROBIOTIC PO), Take 1 capsule by mouth daily., Disp: , Rfl:    SILVADENE 1 % cream, SMARTSIG:sparingly Topical Twice Daily PRN, Disp: , Rfl:    SYMBICORT 160-4.5 MCG/ACT inhaler, Inhale 2 puffs into the lungs 2 (two) times daily as needed., Disp: , Rfl: 5   traZODone (DESYREL) 50 MG tablet, Take 0.5 tablets (25 mg total) by mouth at bedtime as needed for sleep., Disp: 30 tablet, Rfl: 0   TURMERIC PO, Take 1 capsule by mouth daily., Disp: , Rfl:   Past Medical History: Past Medical History:  Diagnosis Date   Anxiety    Arthritis    Asthma    Depression    Diabetes mellitus without complication (HCC)    GERD (gastroesophageal reflux disease)    no meds   Heart murmur    History of kidney stones    h/o   Hypertension    Myocardial infarction ([place]) 2015   Pneumonia 2014   Seizures ([place])    as a child-no since age 26  Severe aortic stenosis     Tobacco Use: Social History   Tobacco Use  Smoking Status Former   Packs/day: 0.25   Years: 13.00   Total pack years: 3.25   Types: Cigarettes  Smokeless Tobacco Never  Tobacco Comments   age [age]    Labs: Review Flowsheet       Latest Ref Rng & Units 01/03/2022  Labs for ITP Cardiac and Pulmonary Rehab  Hemoglobin A1c 4.8 - 5.6 % 8.4      Exercise Target Goals: Exercise Program Goal: Individual exercise prescription set using results from initial 6 min walk test and THRR while considering  patient's activity barriers and safety.   Exercise Prescription Goal: Initial exercise prescription builds to 30-45 minutes a day of aerobic activity, 2-3 days per week.  Home exercise guidelines will be given to patient during program as part of exercise prescription  that the participant will acknowledge.   Education: Aerobic Exercise: - Group verbal and visual presentation on the components of exercise prescription. Introduces F.I.T.T principle from ACSM for exercise prescriptions.  Reviews F.I.T.T. principles of aerobic exercise including progression. Written material given at graduation. Flowsheet Row Cardiac Rehab from 06/04/2022 in [facility] Cardiac and Pulmonary Rehab  Education need identified 02/24/22  Date 05/14/22  Educator [facility]  Instruction Review Code 1- Verbalizes Understanding       Education: Resistance Exercise: - Group verbal and visual presentation on the components of exercise prescription. Introduces F.I.T.T principle from ACSM for exercise prescriptions  Reviews F.I.T.T. principles of resistance exercise including progression. Written material given at graduation. Flowsheet Row Cardiac Rehab from 06/04/2022 in [facility] Cardiac and Pulmonary Rehab  Date 05/20/22  Educator [facility]  Instruction Review Code 1- Verbalizes Understanding        Education: Exercise & Equipment Safety: - Individual verbal instruction and demonstration of equipment use and safety with use of the equipment. Flowsheet Row Cardiac Rehab from 06/04/2022 in [facility] Cardiac and Pulmonary Rehab  Date 02/24/22  Educator [facility]  Instruction Review Code 1- Verbalizes Understanding       Education: Exercise Physiology & General Exercise Guidelines: - Group verbal and written instruction with models to review the exercise physiology of the cardiovascular system and associated critical values. Provides general exercise guidelines with specific guidelines to those with heart or lung disease.  Flowsheet Row Cardiac Rehab from 06/04/2022 in [facility] Cardiac and Pulmonary Rehab  Date 05/07/22  Educator [facility]  Instruction Review Code 1- Verbalizes Understanding       Education: Flexibility, Balance, Mind/Body Relaxation: - Group verbal and visual presentation with interactive activity on the  components of exercise prescription. Introduces F.I.T.T principle from ACSM for exercise prescriptions. Reviews F.I.T.T. principles of flexibility and balance exercise training including progression. Also discusses the mind body connection.  Reviews various relaxation techniques to help reduce and manage stress (i.e. Deep breathing, progressive muscle relaxation, and visualization). Balance handout provided to take home. Written material given at graduation. Flowsheet Row Cardiac Rehab from 06/04/2022 in [facility] Cardiac and Pulmonary Rehab  Date 05/20/22  Educator [facility]  Instruction Review Code 1- Verbalizes Understanding       Activity Barriers & Risk Stratification:  Activity Barriers & Cardiac Risk Stratification - 02/24/22 1008       Activity Barriers & Cardiac Risk Stratification   Activity Barriers Arthritis;Other (comment);Joint Problems    Comments right foot- broken toe, Hip Pain    Cardiac Risk Stratification Moderate             6 Minute  Walk:  6 Minute Walk     Row Name 02/24/22 1006 05/26/22 1123       6 Minute Walk   Phase Initial Discharge    Distance 1085 feet 1310 feet    Distance % Change -- 20.7 %    Distance Feet Change -- 225 ft    Walk Time 6 minutes 6 minutes    # of Rest Breaks 0 0    MPH 2.05 2.48    METS 2.82 3.05    RPE 7 12    Perceived Dyspnea  0 1    VO2 Peak 9.87 10.68    Symptoms Yes (comment) No    Comments Hip Pain 8/10 --    Resting HR 90 bpm 87 bpm    Resting BP 142/78 124/72    Resting Oxygen Saturation  97 % 95 %    Exercise Oxygen Saturation  during 6 min walk 95 % 97 %    Max Ex. HR 107 bpm 102 bpm    Max Ex. BP 144/84 138/78    2 Minute Post BP 140/78 --             Oxygen Initial Assessment:   Oxygen Re-Evaluation:   Oxygen Discharge (Final Oxygen Re-Evaluation):   Initial Exercise Prescription:  Initial Exercise Prescription - 02/24/22 1000       Date of Initial Exercise RX and Referring Provider   Date  02/24/22    Referring Provider Arnoldo Hooker MD      Oxygen   Maintain Oxygen Saturation 88% or higher      Treadmill   MPH 1.8    Grade 0.5    Minutes 15    METs 2.5      NuStep   Level 2    SPM 80    Minutes 15    METs 2.82      Recumbant Elliptical   Level 1    RPM 50    Watts 23    Minutes 15    METs 2.82      T5 Nustep   Level 1    SPM 80    Minutes 15    METs 2.82      Prescription Details   Frequency (times per week) 2    Duration Progress to 30 minutes of continuous aerobic without signs/symptoms of physical distress      Intensity   THRR 40-80% of Max Heartrate 117-144    Ratings of Perceived Exertion 11-13    Perceived Dyspnea 0-4      Progression   Progression Continue to progress workloads to maintain intensity without signs/symptoms of physical distress.      Resistance Training   Training Prescription Yes    Weight 3 lb    Reps 10-15             Perform Capillary Blood Glucose checks as needed.  Exercise Prescription Changes:   Exercise Prescription Changes     Row Name 02/24/22 1000 03/17/22 1500 03/25/22 1100 04/01/22 1300 04/15/22 1300     Response to Exercise   Blood Pressure (Admit) 142/78 132/88 -- 132/62 126/64   Blood Pressure (Exercise) 144/84 158/82 -- 166/78 162/84   Blood Pressure (Exit) 140/78 128/80 -- 122/64 126/62   Heart Rate (Admit) 90 bpm 91 bpm -- 90 bpm 91 bpm   Heart Rate (Exercise) 107 bpm 105 bpm -- 108 bpm 113 bpm   Heart Rate (Exit) 95 bpm 92 bpm -- 93 bpm 98  bpm   Oxygen Saturation (Admit) 97 % -- -- -- --   Oxygen Saturation (Exercise) 95 % -- -- -- --   Rating of Perceived Exertion (Exercise) 7 12 -- 12 15   Perceived Dyspnea (Exercise) 0 -- -- -- --   Symptoms Hip Pain 8/10 calf pain -- none none   Comments Results First four rehab sessions -- -- --   Duration -- Progress to 30 minutes of  aerobic without signs/symptoms of physical distress -- Continue with 30 min of aerobic exercise without  signs/symptoms of physical distress. Continue with 30 min of aerobic exercise without signs/symptoms of physical distress.   Intensity -- THRR unchanged -- THRR unchanged THRR unchanged     Progression   Progression -- Continue to progress workloads to maintain intensity without signs/symptoms of physical distress. -- Continue to progress workloads to maintain intensity without signs/symptoms of physical distress. Continue to progress workloads to maintain intensity without signs/symptoms of physical distress.   Average METs -- 2.67 -- 2.38 2.3     Resistance Training   Training Prescription -- Yes -- Yes Yes   Weight -- 3 lb -- 3 lb 3 lb   Reps -- 10-15 -- 10-15 10-15     Interval Training   Interval Training -- No -- -- No     Treadmill   MPH -- 2 -- 2 2.1   Grade -- 0.5 -- 0.5 1   Minutes -- 15 -- 15 15   METs -- 2.67 -- 2.67 2.9     NuStep   Level -- 6 -- -- 6   Minutes -- 15 -- -- 15   METs -- 5 -- -- 2     Recumbant Elliptical   Level -- 3 -- 3 1   Minutes -- 15 -- 15 15   METs -- 2.6 -- 2.6 1.7     REL-XR   Level -- -- -- 4 --   Minutes -- -- -- 15 --     Home Exercise Plan   Plans to continue exercise at -- -- Home (comment)  walking Home (comment)  walking Home (comment)  walking   Frequency -- -- Add 3 additional days to program exercise sessions. Add 3 additional days to program exercise sessions. Add 3 additional days to program exercise sessions.   Initial Home Exercises Provided -- -- 03/25/22 03/25/22 03/25/22     Oxygen   Maintain Oxygen Saturation -- 88% or higher 88% or higher 88% or higher 88% or higher    Row Name 04/29/22 1100 05/12/22 1300 05/26/22 1200         Response to Exercise   Blood Pressure (Admit) 146/74 128/62 124/74     Blood Pressure (Exercise) -- -- 142/80     Blood Pressure (Exit) 134/76 128/62 140/82     Heart Rate (Admit) 93 bpm 86 bpm 102 bpm     Heart Rate (Exercise) 105 bpm 106 bpm 98 bpm     Heart Rate (Exit) 95 bpm 85 bpm  94 bpm     Oxygen Saturation (Admit) -- -- 96 %     Oxygen Saturation (Exercise) -- -- 97 %     Oxygen Saturation (Exit) -- -- 96 %     Rating of Perceived Exertion (Exercise) Symptoms none none none     Duration Continue with 30 min of aerobic exercise without signs/symptoms of physical distress. Continue with 30 min of aerobic exercise  without signs/symptoms of physical distress. Continue with 30 min of aerobic exercise without signs/symptoms of physical distress.     Intensity THRR unchanged THRR unchanged THRR unchanged       Progression   Progression Continue to progress workloads to maintain intensity without signs/symptoms of physical distress. Continue to progress workloads to maintain intensity without signs/symptoms of physical distress. Continue to progress workloads to maintain intensity without signs/symptoms of physical distress.     Average METs 2.79 2.5 3.05       Resistance Training   Training Prescription Yes Yes Yes     Weight 3 lb 3 lb 3 lb     Reps 10-15 10-15 10-15       Interval Training   Interval Training No No No       Treadmill   MPH 2.4 2.1 2.1     Grade 1 1 0.5     Minutes 15 15 15      METs 3.26 2.9 2.75       Recumbant Bike   Level -- 4 --     Watts -- 26 --     Minutes -- 15 --       NuStep   Level 4 -- 3     Minutes 15 -- 15     METs 2.2 -- 3       Recumbant Elliptical   Level -- 1 --     Minutes -- 15 --     METs -- 1.7 --       REL-XR   Level 4 2 2      Minutes 15 15 15      METs 3 -- 3.4       Home Exercise Plan   Plans to continue exercise at Home (comment)  walking Home (comment)  walking Home (comment)  walking     Frequency Add 3 additional days to program exercise sessions. Add 3 additional days to program exercise sessions. Add 3 additional days to program exercise sessions.     Initial Home Exercises Provided 03/25/22 03/25/22 03/25/22       Oxygen   Maintain Oxygen Saturation 88% or higher 88% or higher 88% or  higher              Exercise Comments:   Exercise Comments     Row Name 03/03/22 1149 05/05/22 1151         Exercise Comments First full day of exercise!  Patient was oriented to gym and equipment including functions, settings, policies, and procedures.  Patient's individual exercise prescription and treatment plan were reviewed.  All starting workloads were established based on the results of the 6 minute walk test done at initial orientation visit.  The plan for exercise progression was also introduced and progression will be customized based on patient's performance and goals. Carrine told staff she had  stopped  her exercise because she had 9/10 chest oain. The poain resolved as soon as she stopped. No more episodes this session.  She was advised to call MD if the episodes increase in frequency or severity.  To call 911 if she has symptoms that won't resolve. She verbalized understanding.               Exercise Goals and Review:   Exercise Goals     Row Name 02/24/22 1023             Exercise Goals   Increase Physical Activity Yes  Intervention Provide advice, education, support and counseling about physical activity/exercise needs.;Develop an individualized exercise prescription for aerobic and resistive training based on initial evaluation findings, risk stratification, comorbidities and participant's personal goals.       Expected Outcomes Short Term: Attend rehab on a regular basis to increase amount of physical activity.;Long Term: Add in home exercise to make exercise part of routine and to increase amount of physical activity.;Long Term: Exercising regularly at least 3-5 days a week.       Increase Strength and Stamina Yes       Intervention Provide advice, education, support and counseling about physical activity/exercise needs.;Develop an individualized exercise prescription for aerobic and resistive training based on initial evaluation findings, risk  stratification, comorbidities and participant's personal goals.       Expected Outcomes Short Term: Increase workloads from initial exercise prescription for resistance, speed, and METs.;Short Term: Perform resistance training exercises routinely during rehab and add in resistance training at home;Long Term: Improve cardiorespiratory fitness, muscular endurance and strength as measured by increased METs and functional capacity (6MWT)       Able to understand and use rate of perceived exertion (RPE) scale Yes       Intervention Provide education and explanation on how to use RPE scale       Expected Outcomes Short Term: Able to use RPE daily in rehab to express subjective intensity level;Long Term:  Able to use RPE to guide intensity level when exercising independently       Able to understand and use Dyspnea scale Yes       Intervention Provide education and explanation on how to use Dyspnea scale       Expected Outcomes Short Term: Able to use Dyspnea scale daily in rehab to express subjective sense of shortness of breath during exertion;Long Term: Able to use Dyspnea scale to guide intensity level when exercising independently       Knowledge and understanding of Target Heart Rate Range (THRR) Yes       Intervention Provide education and explanation of THRR including how the numbers were predicted and where they are located for reference       Expected Outcomes Short Term: Able to state/look up THRR;Long Term: Able to use THRR to govern intensity when exercising independently;Short Term: Able to use daily as guideline for intensity in rehab       Able to check pulse independently Yes       Intervention Provide education and demonstration on how to check pulse in carotid and radial arteries.;Review the importance of being able to check your own pulse for safety during independent exercise       Expected Outcomes Short Term: Able to explain why pulse checking is important during independent  exercise;Long Term: Able to check pulse independently and accurately       Understanding of Exercise Prescription Yes       Intervention Provide education, explanation, and written materials on patient's individual exercise prescription       Expected Outcomes Short Term: Able to explain program exercise prescription;Long Term: Able to explain home exercise prescription to exercise independently                Exercise Goals Re-Evaluation :  Exercise Goals Re-Evaluation     Row Name 02/24/22 1025 03/03/22 1149 03/10/22 1141 03/17/22 1521 03/25/22 1115     Exercise Goal Re-Evaluation   Exercise Goals Review -- Knowledge and understanding of Target Heart Rate Range (THRR);Able to  understand and use rate of perceived exertion (RPE) scale;Understanding of Exercise Prescription;Able to understand and use Dyspnea scale Knowledge and understanding of Target Heart Rate Range (THRR);Able to understand and use rate of perceived exertion (RPE) scale;Understanding of Exercise Prescription;Able to understand and use Dyspnea scale Understanding of Exercise Prescription;Increase Physical Activity;Increase Strength and Stamina Understanding of Exercise Prescription;Increase Physical Activity;Increase Strength and Stamina   Comments -- Reviewed RPE scale, THR and program prescription with pt today.  Pt voiced understanding and was given a copy of goals to take home. She reports not walking as much outside due to the colder weather. She has thought about getting a stepper for thier home. EP has not gone over home exercise yet. Edlin is doing well in rehab. She averaged 2.67 METs during her first three rehab sessions. She also was able to increase her speed on the treadmill to 2 mph. She improved to level 3 on the REL and level 6 on the T4 as well. We will continue to monitor her progress in the program. Reviewed home exercise with pt today.  Pt plans to walk for exercise. She is not interested in joining a gym.  Patient currently walks outside for about 30 minutes at one time, which include variations in elevation. Now that it is getting colder outside, she was encouraged to keep walking indoors, perhaps at a grocery store like [employer], [employer], etc. She has a BP cuff that checks her HR but is interested in purchasing a pulse ox. Reviewed THR, pulse, RPE, sign and symptoms, pulse oximetery and when to call 911 or MD.  Also discussed weather considerations and indoor options.  Pt voiced understanding.   Expected Outcomes -- Short: Use RPE daily to regulate intensity. Long: Follow program prescription in THR. Short: EP to go over home exercise. Long: Follow program prescription in THR. Short: Continue to increase workloads as tolerated. Long: Continue to increase strength and stamina. Short: Start checking HR during walks Long: Continue to exercise independently at appropriate prescription    Row Name 03/31/22 1110 04/01/22 1316 04/15/22 1324 04/28/22 1110 04/29/22 1154     Exercise Goal Re-Evaluation   Exercise Goals Review -- Understanding of Exercise Prescription;Increase Physical Activity;Increase Strength and Stamina Understanding of Exercise Prescription;Increase Physical Activity;Increase Strength and Stamina Understanding of Exercise Prescription;Increase Physical Activity;Increase Strength and Stamina Understanding of Exercise Prescription;Increase Physical Activity;Increase Strength and Stamina   Comments Tanysha reports not being able to start home exercise yet asie from some walking. She has weights and our youtube channel that she would like to check out. Encouraged her to begin incorporating home exercise. Sherene continues to do well in rehab. She has increased to level 4 on the XR! She is not quite hitting her THR but her RPES are in appropriate range. She could benefit from increasing her incline on the treadmill. Will continue to monitor. Chaniyah is doing well in rehab. She has consistently kept her  overall average MET level above 2.3 METs. She also was able to increase her workload on the treadmill to a speed of 2.1 mph and an incline of 1%. She has also done well with 3 lb hand weights for resistance training, and may benefit from trying 4 lbs. We will continue to monitor her progress. Krystle is doing well in rehab. She was out due to a UTI for about two weeks but is ready to get back to her normal routine. She has steadily been increasing her workloads. She states that she was feeling a lot  better before the UTI, specifically she states that she could see an increase in her stamina. She has been walking at home for around 20 minutes on her days away from rehab. We will continue to monitor her progress in the program. Tristina continues to do well in rehab. She did increase her treadmill speed up to 2.4 mph now working over 3 METS. Her level on the T4 Nustep has stayed consistent at level 4, prior to that she was doing 6, and would benefit from increasing that back up. She is not quite hitting her THR but RPEs are staying in appropriate range. Will continue to monitor.   Expected Outcomes -- Short: Increase incline on TM to 1% Long: Continue to increase overall MET level Short: Try 4 lb hand weights. Long: Continue to improve strength and stamina. Short: Slowly return to exercise routine after dealing with UTI. Long: Continue to improve strength and stamina. Short: Increase T4 Nustep back up to 6 Long: Continue to increase overall MET level    Row Name 05/12/22 1323 05/26/22 1138           Exercise Goal Re-Evaluation   Exercise Goals Review Understanding of Exercise Prescription;Increase Physical Activity;Increase Strength and Stamina Understanding of Exercise Prescription;Increase Physical Activity;Increase Strength and Stamina      Comments Peggyann continues to do well in rehab. She has consistently worked at a MET level above 2.5 METs. She has also started to add incline on the treadmill again after  being out for a couple of weeks. She was up to 2.4 mph with an incline of 1% on the treadmill, since returning she is at 2.1 mph and an incline of 1%. We will encourage her to continue to increase her treadmill workload. We will continue to monitor her progress. Deena has done well in the program and is close to graduating from the program. She states that the exercise has helped her to feel much better. She also reports feeling stronger and states that her endurance has improved. She states that she wants to do yoga, pilates, and resistance trainging with hand weights. She also states that she plans to walk at home for aerobic exercise.      Expected Outcomes Short: Increase treadmill speed back up to 2.4 mph. Long: Continue to improve strength and stamina. Short: Graduate from the program. Long: Continue to exericse independently.               Discharge Exercise Prescription (Final Exercise Prescription Changes):  Exercise Prescription Changes - 05/26/22 1200       Response to Exercise   Blood Pressure (Admit) 124/74    Blood Pressure (Exercise) 142/80    Blood Pressure (Exit) 140/82    Heart Rate (Admit) 102 bpm    Heart Rate (Exercise) 98 bpm    Heart Rate (Exit) 94 bpm    Oxygen Saturation (Admit) 96 %    Oxygen Saturation (Exercise) 97 %    Oxygen Saturation (Exit) 96 %    Rating of Perceived Exertion (Exercise) 13    Symptoms none    Duration Continue with 30 min of aerobic exercise without signs/symptoms of physical distress.    Intensity THRR unchanged      Progression   Progression Continue to progress workloads to maintain intensity without signs/symptoms of physical distress.    Average METs 3.05      Resistance Training   Training Prescription Yes    Weight 3 lb    Reps 10-15  Interval Training   Interval Training No      Treadmill   MPH 2.1    Grade 0.5    Minutes 15    METs 2.75      NuStep   Level 3    Minutes 15    METs 3      REL-XR   Level 2     Minutes 15    METs 3.4      Home Exercise Plan   Plans to continue exercise at Home (comment)   walking   Frequency Add 3 additional days to program exercise sessions.    Initial Home Exercises Provided 03/25/22      Oxygen   Maintain Oxygen Saturation 88% or higher             Nutrition:  Target Goals: Understanding of nutrition guidelines, daily intake of sodium 1500mg , cholesterol 200mg , calories 30% from fat and 7% or less from saturated fats, daily to have 5 or more servings of fruits and vegetables.  Education: All About Nutrition: -Group instruction provided by verbal, written material, interactive activities, discussions, models, and posters to present general guidelines for heart healthy nutrition including fat, fiber, MyPlate, the role of sodium in heart healthy nutrition, utilization of the nutrition label, and utilization of this knowledge for meal planning. Follow up email sent as well. Written material given at graduation. Flowsheet Row Cardiac Rehab from 06/04/2022 in [facility] Cardiac and Pulmonary Rehab  Education need identified 02/24/22  Date 03/12/22  Educator [place]  Instruction Review Code 1- Verbalizes Understanding       Biometrics:  Pre Biometrics - 02/24/22 1026       Pre Biometrics   Height 5' 4.25" (1.632 m)    Weight 189 lb 12.8 oz (86.1 kg)    Waist Circumference 45 inches    Hip Circumference 45.5 inches    Waist to Hip Ratio 0.99 %    BMI (Calculated) 32.32    Single Leg Stand 14.6 seconds             Post Biometrics - 05/26/22 1126        Post  Biometrics   Height 5' 4.25" (1.632 m)    Weight 190 lb 12.8 oz (86.5 kg)    Waist Circumference 44 inches    Hip Circumference 44.5 inches    Waist to Hip Ratio 0.99 %    BMI (Calculated) 32.49    Single Leg Stand 30 seconds             Nutrition Therapy Plan and Nutrition Goals:  Nutrition Therapy & Goals - 02/24/22 0958       Nutrition Therapy   Diet Heart healthy, low Na,  T2DM MNT    Drug/Food Interactions Coumadin/Vit K    Protein (specify units) 75-85g    Fiber 28 grams    Whole Grain Foods 3 servings    Saturated Fats 15 max. grams    Fruits and Vegetables 8 servings/day    Sodium 2 grams      Personal Nutrition Goals   Nutrition Goal ST: switch to whole wheat bread, include fruits/vegetables at most meals using MyPlate as a guide, eat small and frequent meals with consistent CHO, eat consistent amounts of vitamin K daily LT: limit saturated fat <15g, achieve and maintain A1C <7, eat at least 28g of fiber per day, manage INR within recommended range by MD    Comments 62 y.o. F admitted to cardiac rehab s/p AVR. PMHx inlcudes  diverticulosis, T2DM, former smoker, HTN, HLD, MI, CHF. Relevant medications includes coumadin, lantus, probiotic, ozempic. Recent A1C 01/05/22: 8.5. Lesle reports having a green house and garden, she reports they eat of a lot of fish as they go fishing often. Food recall includes: B: grits or oatmeal with eggs and bacon L: pimento cheese sandwich (honey-wheat) D: pizza, stirfry. Fish, chicken (baked) with skin (air frier), shrimp - olive oil, canola oil. or cereal (total).  S: ice cream (1 black cherry cone). Discussed T2DM MNT, ozempic meal planning (smaller more frequent meals recommended), heart healthy eating, and warfarin MNT.      Intervention Plan   Intervention Prescribe, educate and counsel regarding individualized specific dietary modifications aiming towards targeted core components such as weight, hypertension, lipid management, diabetes, heart failure and other comorbidities.;Nutrition handout(s) given to patient.    Expected Outcomes Short Term Goal: Understand basic principles of dietary content, such as calories, fat, sodium, cholesterol and nutrients.;Short Term Goal: A plan has been developed with personal nutrition goals set during dietitian appointment.;Long Term Goal: Adherence to prescribed nutrition plan.              Nutrition Assessments:  MEDIFICTS Score Key: ?70 Need to make dietary changes  40-70 Heart Healthy Diet ? 40 Therapeutic Level Cholesterol Diet  Flowsheet Row Cardiac Rehab from 05/28/2022 in [facility] Cardiac and Pulmonary Rehab  Picture Your Plate Total Score on Discharge 66      Picture Your Plate Scores: <16 Unhealthy dietary pattern with much room for improvement. 41-50 Dietary pattern unlikely to meet recommendations for good health and room for improvement. 51-60 More healthful dietary pattern, with some room for improvement.  >60 Healthy dietary pattern, although there may be some specific behaviors that could be improved.    Nutrition Goals Re-Evaluation:  Nutrition Goals Re-Evaluation     Row Name 03/10/22 1136 03/31/22 1112 04/28/22 1115 05/26/22 1146       Goals   Nutrition Goal ST: switch to whole wheat bread, include fruits/vegetables at most meals using MyPlate as a guide, eat small and frequent meals with consistent CHO, eat consistent amounts of vitamin K daily LT: limit saturated fat <15g, achieve and maintain A1C <7, eat at least 28g of fiber per day, manage INR within recommended range by MD ST: use MyPlate as a guide, consistent CHO and lower GI CHOs, eat consistent amounts of vitamin K daily LT: limit saturated fat <15g, achieve and maintain A1C <7, eat at least 28g of fiber per day, manage INR within recommended range by MD -- --    Comment Kynzlee reports doing well with her nutrition and has been getting in more fruits/vegetables, but hasd been doing more fruit and dried fruit. She feels she may be having too much fruit, discussed pairing fruit with healthy fat and protein - she has it with nuts/seeds, but has multiple servings of fruit; discussed breaking up this snack to avoid having too mnay CHO at one time. Encouraged to continue with changes made. Anthony reports doing well with her nutrition overall, but is having a hard time getting motivated to cook meals -  sometimes she will go out for grilled chicken and salad, but for the most part her husband has been cooking. discussed adding in easy vegetables like frozen vegetables to dinner and swapping to carbohydrates that wont effect her BG as much as it has been running higher since her hospital stay and being unable to get ozempic. Suggested carbohydrates like whole grains, red skin or  sweet potatoes, beans/lentils more often than white rice, white potatoes, and corn. She reports snacking on a mix of chex cereal, nuts/seeds, and caramel corn - suggested limiting sodium and caramel corn. Alonna reports doing well with her nutrition overall, but is having a hard time getting motivated to cook meals - sometimes she will go out for grilled chicken and salad, but for the most part her husband has been cooking. discussed adding in easy vegetables like frozen vegetables to dinner and swapping to carbohydrates that wont effect her BG as much as it has been running higher since her hospital stay and being unable to get ozempic. Suggested carbohydrates like whole grains, red skin or sweet potatoes, beans/lentils more often than white rice, white potatoes, and corn. She reports snacking on a mix of chex cereal, nuts/seeds, and caramel corn - suggested limiting sodium and caramel corn. Verlean reports doing well with her nutrition overall, but is having a hard time getting motivated to cook meals - sometimes she will go out for grilled chicken and salad, but for the most part her husband has been cooking. discussed adding in easy vegetables like frozen vegetables to dinner and swapping to carbohydrates that wont effect her BG as much as it has been running higher since her hospital stay and being unable to get ozempic. Suggested carbohydrates like whole grains, red skin or sweet potatoes, beans/lentils more often than white rice, white potatoes, and corn. She reports snacking on a mix of chex cereal, nuts/seeds, and caramel corn -  suggested limiting sodium and caramel corn.    Expected Outcome ST: switch to whole wheat bread, include fruits/vegetables at most meals using MyPlate as a guide, eat small and frequent meals with consistent CHO, eat consistent amounts of vitamin K daily LT: limit saturated fat <15g, achieve and maintain A1C <7, eat at least 28g of fiber per day, manage INR within recommended range by MD ST: use MyPlate as a guide, consistent CHO and lower GI CHOs, eat consistent amounts of vitamin K daily LT: limit saturated fat <15g, achieve and maintain A1C <7, eat at least 28g of fiber per day, manage INR within recommended range by MD ST: use MyPlate as a guide, consistent CHO and lower GI CHOs, eat consistent amounts of vitamin K daily LT: limit saturated fat <15g, achieve and maintain A1C <7, eat at least 28g of fiber per day, manage INR within recommended range by MD ST: use MyPlate as a guide, consistent CHO and lower GI CHOs, eat consistent amounts of vitamin K daily LT: limit saturated fat <15g, achieve and maintain A1C <7, eat at least 28g of fiber per day, manage INR within recommended range by MD             Nutrition Goals Discharge (Final Nutrition Goals Re-Evaluation):  Nutrition Goals Re-Evaluation - 05/26/22 1146       Goals   Comment Miriya reports doing well with her nutrition overall, but is having a hard time getting motivated to cook meals - sometimes she will go out for grilled chicken and salad, but for the most part her husband has been cooking. discussed adding in easy vegetables like frozen vegetables to dinner and swapping to carbohydrates that wont effect her BG as much as it has been running higher since her hospital stay and being unable to get ozempic. Suggested carbohydrates like whole grains, red skin or sweet potatoes, beans/lentils more often than white rice, white potatoes, and corn. She reports snacking on a mix of  chex cereal, nuts/seeds, and caramel corn - suggested limiting  sodium and caramel corn.    Expected Outcome ST: use MyPlate as a guide, consistent CHO and lower GI CHOs, eat consistent amounts of vitamin K daily LT: limit saturated fat <15g, achieve and maintain A1C <7, eat at least 28g of fiber per day, manage INR within recommended range by MD             Psychosocial: Target Goals: Acknowledge presence or absence of significant depression and/or stress, maximize coping skills, provide positive support system. Participant is able to verbalize types and ability to use techniques and skills needed for reducing stress and depression.   Education: Stress, Anxiety, and Depression - Group verbal and visual presentation to define topics covered.  Reviews how body is impacted by stress, anxiety, and depression.  Also discusses healthy ways to reduce stress and to treat/manage anxiety and depression.  Written material given at graduation. Flowsheet Row [facility]  Date 04/30/22  [facility]  Instruction Review Code 1- [employer] Squ[employer]bVerbalizes Understanding       Education: Sleep Hygiene -Provides group verbal and written instruction about how sleep can affect your health.  Define sleep hygiene, discuss sleep cycles and impact of sleep habits. Review good sleep hygiene tips.    Initial Review & Psychosocial Screening:  Initial Psych Review & Screening - 02/05/22 1443       Initial Review   Current issues with None Identified      Family Dynamics   Good Support System? Yes   husband, sisters     Barriers   Psychosocial barriers to participate in program There are no identifiable barriers or psychosocial needs.      Screening Interventions   Interventions Encouraged to exercise;Provide feedback about the scores to participant;To provide support and resources with identified psychosocial needs    Expected Outcomes Short Term goal: Utilizing psychosocial counselor, staff and physician to assist with  identification of specific Stressors or current issues interfering with healing process. Setting desired goal for each stressor or current issue identified.;Long Term Goal: Stressors or current issues are controlled or eliminated.;Short Term goal: Identification and review with participant of any Quality of Life or Depression concerns found by scoring the questionnaire.;Long Term goal: The participant improves quality of Life and PHQ9 Scores as seen by post scores and/or verbalization of changes             Quality of Life Scores:   Quality of Life - 05/28/22 1118       Quality of Life   Select Quality of Life      Quality of Life Scores   Health/Function Pre 14.36 %    Health/Function Post 18.8 %    Health/Function % Change 30.92 %    Socioeconomic Pre 15.25 %    Socioeconomic Post 18.86 %    Socioeconomic % Change  23.67 %    Psych/Spiritual Pre 15 %    Psych/Spiritual Post 25.71 %    Psych/Spiritual % Change 71.4 %    Family Pre 18 %    Family Post 9.6 %    Family % Change -46.67 %    GLOBAL Pre 15.24 %    GLOBAL Post 18.88 %    GLOBAL % Change 23.88 %            Scores of 19 and below usually indicate a poorer quality of life in these areas.  A difference of  2-3 points is a clinically meaningful difference.  A difference of 2-3 points in the total score of the Quality of Life Index has been associated with significant improvement in overall quality of life, self-image, physical symptoms, and general health in studies assessing change in quality of life.  PHQ-9: Review Flowsheet       05/28/2022 04/28/2022 03/24/2022 02/24/2022  Depression screen PHQ 2/9  Decreased Interest 1 1 0 1  Down, Depressed, Hopeless 1 1 0 1  PHQ - 2 Score 2 2 0 2  Altered sleeping 1 3 1 2   Tired, decreased energy 1 3 1 1   Change in appetite 1 3 1 1   Feeling bad or failure about yourself  2 3 0 2  Trouble concentrating 0 2 1 1   Moving slowly or fidgety/restless 2 2 2 3   Suicidal thoughts  0 0 0 0  PHQ-9 Score 9 18 6 12   Difficult doing work/chores Somewhat difficult Somewhat difficult Somewhat difficult Somewhat difficult   Interpretation of Total Score  Total Score Depression Severity:  1-4 = Minimal depression, 5-9 = Mild depression, 10-14 = Moderate depression, 15-19 = Moderately severe depression, 20-27 = Severe depression   Psychosocial Evaluation and Intervention:  Psychosocial Evaluation - 02/05/22 1449       Psychosocial Evaluation & Interventions   Interventions Encouraged to exercise with the program and follow exercise prescription;Stress management education;Relaxation education    Comments [place] MutaDiane is coming to cardiac rehab after an AVR. She reports that there was quite a bit of pain, but it is easing off now. Her IV did infiltrate in the hospital and her arm is still sore. Her sleep habits are returning to normal. She does not express any stress concerns at this time, besided just the healing process. Her husband and sisters are her main support and he plans on coming with her to education. She wants to come to the program to learn more of what she should be doing to help her health.    Expected Outcomes Short: attend cardiac rehab for education and exericse. Long: develop and maintain positive self care habits.    Continue Psychosocial Services  Follow up required by staff             Psychosocial Re-Evaluation:  Psychosocial Re-Evaluation     Row Name 03/10/22 1148 03/24/22 1110 03/31/22 1124 04/28/22 1116 05/26/22 1143     Psychosocial Re-Evaluation   Current issues with Current Stress Concerns Current Stress Concerns;Current Depression Current Stress Concerns;Current Depression Current Stress Concerns;Current Depression Current Stress Concerns   Comments Keyira reports that her husband is a good support system for her and her sister as well, but to a lesser capacity. She reports being stressed for her son, but did not want to discuss further at this time.  She reports walking, reading the bible, and playing wordscape to help with stress reduction. She reports sleeping well now without restless leg. Jakeria has improved her PHQ from 12 down to 6.  She has changed up her medication timing and is sleeping better and feeling better.  She has also enjoyed  having family around this week for Thanksgiving!! Zehra reports no major stress concerns at this time - she relies on her husband for support. She takes CBD at night to help with sleep. She reports walking, reading the bible, and playing wordscape to help with stress reduction. Porcha reports that her mental health has not been doing well as she and her husband have been dealing with some  health issues. She was diagnosed with a UTI about a week ago, but has started to feel better. She states that she enjoys walking, reading the Bible, and playing wordscape to help with stress reduction. She reports having a good support system at home made up primarily by her husband, her sister, and her oldest daughter. Bettyjean reports that she is doing well mentally and does not have any current stressors. She states that when she does begin to feel sad she prays and that helps her to feel better. She also enjoys walking, reading the Bible, and playing wordscape for stress relief. She reports having a good support system at home made up primarily by her husband, and also her sister and oldest daughter.   Expected Outcomes ST: continue with stress reducing habits LT: manage stress by utilizing tools and support system SHort: Conitnue to focus on positive Long: Continue to take care of herself SHort: Conitnue to attend cardiac rehab for mental health boost Long: Continue to focus on positive Short: Conitnue to attend cardiac rehab for mental health boost. Long: Continue to maintain positive outlook. Short: Continue to exercise for stress relief. Long: Continue to maintain positive outlook.   Interventions Encouraged to attend Cardiac  Rehabilitation for the exercise Stress management education;Encouraged to attend Cardiac Rehabilitation for the exercise Stress management education;Encouraged to attend Cardiac Rehabilitation for the exercise Stress management education;Encouraged to attend Cardiac Rehabilitation for the exercise Encouraged to attend Cardiac Rehabilitation for the exercise   Continue Psychosocial Services  Follow up required by staff Follow up required by staff Follow up required by staff Follow up required by staff Follow up required by staff            Psychosocial Discharge (Final Psychosocial Re-Evaluation):  Psychosocial Re-Evaluation - 05/26/22 1143       Psychosocial Re-Evaluation   Current issues with Current Stress Concerns    Comments Colbi reports that she is doing well mentally and does not have any current stressors. She states that when she does begin to feel sad she prays and that helps her to feel better. She also enjoys walking, reading the Bible, and playing wordscape for stress relief. She reports having a good support system at home made up primarily by her husband, and also her sister and oldest daughter.    Expected Outcomes Short: Continue to exercise for stress relief. Long: Continue to maintain positive outlook.    Interventions Encouraged to attend Cardiac Rehabilitation for the exercise    Continue Psychosocial Services  Follow up required by staff             Vocational Rehabilitation: Provide vocational rehab assistance to qualifying candidates.   Vocational Rehab Evaluation & Intervention:  Vocational Rehab - 02/05/22 1443       Initial Vocational Rehab Evaluation & Intervention   Assessment shows need for Vocational Rehabilitation No             Education: Education Goals: Education classes will be provided on a variety of topics geared toward better understanding of heart health and risk factor modification. Participant will state understanding/return  demonstration of topics presented as noted by education test scores.  Learning Barriers/Preferences:  Learning Barriers/Preferences - 02/05/22 1443       Learning Barriers/Preferences   Learning Barriers None    Learning Preferences Individual Instruction             General Cardiac Education Topics:  AED/CPR: - Group verbal and written instruction with the use of models  to demonstrate the basic use of the AED with the basic ABC's of resuscitation.   Anatomy and Cardiac Procedures: - Group verbal and visual presentation and models provide information about basic cardiac anatomy and function. Reviews the testing methods done to diagnose heart disease and the outcomes of the test results. Describes the treatment choices: Medical Management, Angioplasty, or Coronary Bypass Surgery for treating various heart conditions including Myocardial Infarction, Angina, Valve Disease, and Cardiac Arrhythmias.  Written material given at graduation. Flowsheet Row Cardiac Rehab from 06/04/2022 in [facility] Cardiac and Pulmonary Rehab  Education need identified 02/24/22  Date 06/04/22  Educator SB  Instruction Review Code 1- Verbalizes Understanding       Medication Safety: - Group verbal and visual instruction to review commonly prescribed medications for heart and lung disease. Reviews the medication, class of the drug, and side effects. Includes the steps to properly store meds and maintain the prescription regimen.  Written material given at graduation. Flowsheet Row Cardiac Rehab from 06/04/2022 in [facility] Cardiac and Pulmonary Rehab  Date 04/09/22  Educator SB  Instruction Review Code 1- Verbalizes Understanding       Intimacy: - Group verbal instruction through game format to discuss how heart and lung disease can affect sexual intimacy. Written material given at graduation.. Flowsheet Row Cardiac Rehab from 06/04/2022 in [facility] Cardiac and Pulmonary Rehab  Date 05/14/22  Educator [facility]  Instruction  Review Code 1- Verbalizes Understanding       Know Your Numbers and Heart Failure: - Group verbal and visual instruction to discuss disease risk factors for cardiac and pulmonary disease and treatment options.  Reviews associated critical values for Overweight/Obesity, Hypertension, Cholesterol, and Diabetes.  Discusses basics of heart failure: signs/symptoms and treatments.  Introduces Heart Failure Zone chart for action plan for heart failure.  Written material given at graduation. Flowsheet Row Cardiac Rehab from 06/04/2022 in [facility] Cardiac and Pulmonary Rehab  Date 04/16/22  Educator SB  Instruction Review Code 1- Verbalizes Understanding       Infection Prevention: - Provides verbal and written material to individual with discussion of infection control including proper hand washing and proper equipment cleaning during exercise session. Flowsheet Row Cardiac Rehab from 06/04/2022 in [facility] Cardiac and Pulmonary Rehab  Date 02/24/22  Educator NT  Instruction Review Code 1- Verbalizes Understanding       Falls Prevention: - Provides verbal and written material to individual with discussion of falls prevention and safety. Flowsheet Row Cardiac Rehab from 06/04/2022 in [facility] Cardiac and Pulmonary Rehab  Date 02/24/22  Educator NT  Instruction Review Code 1- Verbalizes Understanding       Other: -Provides group and verbal instruction on various topics (see comments)   Knowledge Questionnaire Score:  Knowledge Questionnaire Score - 05/28/22 1117       Knowledge Questionnaire Score   Post Score 23/26             Core Components/Risk Factors/Patient Goals at Admission:  Personal Goals and Risk Factors at Admission - 02/24/22 0953       Core Components/Risk Factors/Patient Goals on Admission    Weight Management Yes;Weight Loss    Intervention Weight Management: Develop a combined nutrition and exercise program designed to reach desired caloric intake, while maintaining  appropriate intake of nutrient and fiber, sodium and fats, and appropriate energy expenditure required for the weight goal.;Weight Management: Provide education and appropriate resources to help participant work on and attain dietary goals.    Admit Weight 189 lb 12.8 oz (  86.1 kg)    Goal Weight: Short Term 184 lb (83.5 kg)    Goal Weight: Long Term 179 lb (81.2 kg)    Expected Outcomes Short Term: Continue to assess and modify interventions until short term weight is achieved;Long Term: Adherence to nutrition and physical activity/exercise program aimed toward attainment of established weight goal;Weight Loss: Understanding of general recommendations for a balanced deficit meal plan, which promotes 1-2 lb weight loss per week and includes a negative energy balance of [phone] kcal/d;Understanding recommendations for meals to include 15-35% energy as protein, 25-35% energy from fat, 35-60% energy from carbohydrates, less than 200mg  of dietary cholesterol, 20-35 gm of total fiber daily;Understanding of distribution of calorie intake throughout the day with the consumption of 4-5 meals/snacks;Weight Gain: Understanding of general recommendations for a high calorie, high protein meal plan that promotes weight gain by distributing calorie intake throughout the day with the consumption for 4-5 meals, snacks, and/or supplements    Diabetes Yes    Intervention Provide education about signs/symptoms and action to take for hypo/hyperglycemia.;Provide education about proper nutrition, including hydration, and aerobic/resistive exercise prescription along with prescribed medications to achieve blood glucose in normal ranges: Fasting glucose 65-99 mg/dL    Expected Outcomes Short Term: Participant verbalizes understanding of the signs/symptoms and immediate care of hyper/hypoglycemia, proper foot care and importance of medication, aerobic/resistive exercise and nutrition plan for blood glucose control.;Long Term:  Attainment of HbA1C < 7%.    Hypertension Yes    Intervention Provide education on lifestyle modifcations including regular physical activity/exercise, weight management, moderate sodium restriction and increased consumption of fresh fruit, vegetables, and low fat dairy, alcohol moderation, and smoking cessation.;Monitor prescription use compliance.    Expected Outcomes Short Term: Continued assessment and intervention until BP is < 140/49mm HG in hypertensive participants. < 130/82mm HG in hypertensive participants with diabetes, heart failure or chronic kidney disease.;Long Term: Maintenance of blood pressure at goal levels.    Lipids Yes    Intervention Provide education and support for participant on nutrition & aerobic/resistive exercise along with prescribed medications to achieve LDL 70mg , HDL >40mg .    Expected Outcomes Short Term: Participant states understanding of desired cholesterol values and is compliant with medications prescribed. Participant is following exercise prescription and nutrition guidelines.;Long Term: Cholesterol controlled with medications as prescribed, with individualized exercise RX and with personalized nutrition plan. Value goals: LDL < 70mg , HDL > 40 mg.             Education:Diabetes - Individual verbal and written instruction to review signs/symptoms of diabetes, desired ranges of glucose level fasting, after meals and with exercise. Acknowledge that pre and post exercise glucose checks will be done for 3 sessions at entry of program. Flowsheet Row Cardiac Rehab from 06/04/2022 in [facility]  Date 02/05/22  Educator [facility]  Instruction Review Code 1- Verbalizes Understanding       Core Components/Risk Factors/Patient Goals Review:   Goals and Risk Factor Review     Row Name 03/10/22 1144 03/10/22 1147 03/31/22 1113 04/28/22 1122 05/26/22 1150     Core Components/Risk Factors/Patient Goals Review   Personal Goals Review  Diabetes;Hypertension Diabetes;Hypertension Diabetes;Hypertension Diabetes;Hypertension Diabetes;Hypertension   Review Ai reports that her BG and BP was much more elevated since her hospital admission. She started taking her long acting insulin at night instead of during the day as it was giving her restless legs- encouraged her to let her doctor know - she sees her MD 11/31/23, advised contacting  office before then to let them know. Her BG has been over 300 most mornings - discussed nutrition with RD, working on changes to diet. Lemma reports her BP has been around 150/80 at rest which is higher than her normal - she reports her MD wanted her to start back on Losartin, but did not give her a prescription - encouraged her to reach back out to her cardiologist regarding this. -- Monserrat.  reports still working with her wound from surgery - she is working with her MD to help it heal - she was refered to a wound clinic, but does not know when they will call her; she uses a silver sulfate cream to help with debridement as well as antibiotic salve. She reports that her wounds are getting better and healing, but it is getting more sore. Right now her foot is also acting up; she has broken her toe, she brought it up to her podiatrist and they recommended toe straighteners to avoid amputation; she reports it is doing ok, but is still tender. She reports her BG was 293 this morning - she continues to work on her nutrition. She reports her BP has been running up and down - she feels it gets higher from her snacking- she is going to try adding less salt to her snack mix. She continues to take her medication as directed with no issues, however, they have been adjusting her warfarin. Additionally, she is waiting for more ozempic. Jayana reports that her wound from surgery is healing well as it is no longer open. She hasbeen checking her BP at home and states that it has been within normal ranges. However, she reports that her  blood glucose levels have been all over the place, although she has been taking all her medications as prescribed. She also has been prescribed antibiotics for her UTI that she has been taking as well. Clarece reports that her wound from surgery has healed and has closed up. She has not been checking her BP at home as regularly, but was encouraged to check it consistently to ensure her BP is staying within normal ranges. She reports that her blood glucose levels have been consistent, and she has been taking all her medications as prescribed. She also finished taking the antibiotics she was prescribed for her UTI.   Expected Outcomes ST: contact MD regarding insulin switch and follow up regarding Losartin LT: manage risk factors -- ST: continue to work on diet and home exercise LT: continue to monitor risk factors Short: continue to take antibiotics to recover from UTI. Long: continue to manage lifestyle risk factors. Short: Continue to check BP and blood sugars after graduating from [facility]. Long: Continue to manage lifestyle risk factors.            Core Components/Risk Factors/Patient Goals at Discharge (Final Review):   Goals and Risk Factor Review - 05/26/22 1150       Core Components/Risk Factors/Patient Goals Review   Personal Goals Review Diabetes;Hypertension    Review Trannie reports that her wound from surgery has healed and has closed up. She has not been checking her BP at home as regularly, but was encouraged to check it consistently to ensure her BP is staying within normal ranges. She reports that her blood glucose levels have been consistent, and she has been taking all her medications as prescribed. She also finished taking the antibiotics she was prescribed for her UTI.    Expected Outcomes Short: Continue to check BP and  blood sugars after graduating from [facility]. Long: Continue to manage lifestyle risk factors.             ITP Comments:  ITP Comments     Row Name  02/05/22 1453 02/24/22 0947 02/25/22 0946 03/03/22 1149 03/25/22 0813   ITP Comments Initial phone call completed. Diagnosis can be found in [facility] 9/11. EP Orientation scheduled for Monday 10/23 at 1:30. Completed and gym orientation. Initial ITP created and sent for review to Dr. Bethann Punches, Medical Director. 30 Day review completed. Medical Director ITP review done, changes made as directed, and signed approval by Medical Director.   NEW TO PROGRAM First full day of exercise!  Patient was oriented to gym and equipment including functions, settings, policies, and procedures.  Patient's individual exercise prescription and treatment plan were reviewed.  All starting workloads were established based on the results of the 6 minute walk test done at initial orientation visit.  The plan for exercise progression was also introduced and progression will be customized based on patient's performance and goals. 30 Day review completed. Medical Director ITP review done, changes made as directed, and signed approval by Medical Director.   New to program    Row Name 04/22/22 1025 05/05/22 1151 05/20/22 0944 06/09/22 1109     ITP Comments 30 Day review completed. Medical Director ITP review done, changes made as directed, and signed approval by Medical Director. Anelisse told staff she had  stopped  her exercise because she had 9/10 chest oain. The poain resolved as soon as she stopped. No more episodes this session.  She was advised to call MD if the episodes increase in frequency or severity.  To call 911 if she has symptoms that won't resolve. She verbalized understanding. 30 Day review completed. Medical Director ITP review done, changes made as directed, and signed approval by Medical Director.   has returned to exercise Kimeka graduated today from  rehab with 35 sessions completed.  Details of the patient's exercise prescription and what She needs to do in order to continue the prescription and progress were discussed  with patient.  Patient was given a copy of prescription and goals.  Patient verbalized understanding.  Anais plans to continue to exercise by walking.             Comments: Discharge ITP

## 2022-08-12 ENCOUNTER — Other Ambulatory Visit: Payer: Self-pay

## 2022-08-12 ENCOUNTER — Emergency Department: Payer: Medicaid Other

## 2022-08-12 ENCOUNTER — Emergency Department
Admission: EM | Admit: 2022-08-12 | Discharge: 2022-08-12 | Disposition: A | Discharge status: Home / Self Care | Payer: Medicaid Other | Attending: Emergency Medicine | Admitting: Emergency Medicine

## 2022-08-12 DIAGNOSIS — S0990XA Unspecified injury of head, initial encounter: Secondary | ICD-10-CM | POA: Diagnosis not present

## 2022-08-12 DIAGNOSIS — W01198A Fall on same level from slipping, tripping and stumbling with subsequent striking against other object, initial encounter: Secondary | ICD-10-CM | POA: Diagnosis not present

## 2022-08-12 DIAGNOSIS — S20301A Unspecified superficial injuries of right front wall of thorax, initial encounter: Secondary | ICD-10-CM | POA: Diagnosis present

## 2022-08-12 DIAGNOSIS — Z7901 Long term (current) use of anticoagulants: Secondary | ICD-10-CM | POA: Diagnosis not present

## 2022-08-12 DIAGNOSIS — W19XXXA Unspecified fall, initial encounter: Secondary | ICD-10-CM

## 2022-08-12 DIAGNOSIS — S20211A Contusion of right front wall of thorax, initial encounter: Secondary | ICD-10-CM | POA: Insufficient documentation

## 2022-08-12 NOTE — ED Provider Triage Note (Signed)
Emergency Medicine Provider Triage Evaluation Note  Nancy [name], a 62 y.o. female  was evaluated in triage.  Pt complains of injuries sustained after a mechanical fall. She endorses warfarin use for mechanical valve. She reports tripping and falling onto her right side. She complains of neck pain, head pain, rib pain, and LBP. She denies LOC, N/V, weakness, or chest pain above baseline.    Review of Systems  Positive: Head contusion, neck pain, rib pain Negative: LOC, CP, weakness  Physical Exam  BP (!) 175/99 (BP Location: Left Arm)   Pulse 80   Temp 98 F (36.7 C)   Resp 20   Ht 5\' 3"  (1.6 m)   Wt 80.7 kg   LMP 09/14/2007 (Approximate)   SpO2 98%   BMI 31.53 kg/m  Gen:   Awake, no distress  NAD Resp:  Normal effort CTA MSK:   Moves extremities without difficulty  Other:    Medical Decision Making  Medically screening exam initiated at 1:14 PM.  Appropriate orders placed.  Nancy [name] was informed that the remainder of the evaluation will be completed by another provider, this initial triage assessment does not replace that evaluation, and the importance of remaining in the ED until their evaluation is complete.  Patient to the ED for evaluation of injuries sustained following a mechanical fall.    Nancy Hoard, PA-C 08/12/22 1317

## 2022-08-12 NOTE — ED Provider Notes (Signed)
   [facility] Provider Note    Event Date/Time   First MD Initiated Contact with Patient 08/12/22 1317     (approximate)   History   Fall   HPI  Nancy [name] is a 62 y.o. female who presents after a fall, she is on blood thinners.  She reports she got tripped up while removing a tarp from her pool.  She landed on her right side, complains of mild right chest discomfort, she think she may have hit her head, denies extremity injuries.  No abdominal pain.     Physical Exam   Triage Vital Signs: ED Triage Vitals  Enc Vitals Group     BP 08/12/22 1230 (!) 175/99     Pulse Rate 08/12/22 1230 80     Resp 08/12/22 1230 20     Temp 08/12/22 1230 98 F (36.7 C)     Temp src --      SpO2 08/12/22 1230 98 %     Weight 08/12/22 1237 80.7 kg (178 lb)     Height 08/12/22 1237 1.6 m (5\' 3" )     Head Circumference --      Peak Flow --      Pain Score 08/12/22 1237 10     Pain Loc --      Pain Edu? --      Excl. in GC? --     Most recent vital signs: Vitals:   08/12/22 1230  BP: (!) 175/99  Pulse: 80  Resp: 20  Temp: 98 F (36.7 C)  SpO2: 98%     General: Awake, no distress.  CV:  Good peripheral perfusion.  No bony abnormalities of the chest wall, no sternal tenderness Resp:  Normal effort.  Abd:  No distention.  Soft, nontender Other:  No pain with range of motion of all extremities.  No pain with axial load on both hips.  No vertebral tenderness to palpation   ED Results / Procedures / Treatments   Labs (all labs ordered are listed, but only abnormal results are displayed) Labs Reviewed - No data to display   EKG     RADIOLOGY CT lumbar spine viewed interpreted by me, no fractures noted, pending radiology review CT head and cervical spine are unremarkable, CT ribs unremarkable    PROCEDURES:  Critical Care performed:   Procedures   MEDICATIONS ORDERED IN ED: Medications - No data to display   IMPRESSION / MDM /  ASSESSMENT AND PLAN / ED COURSE  I reviewed the triage vital signs and the nursing notes. Patient's presentation is most consistent with acute presentation with potential threat to life or bodily function.  Patient presents after mechanical fall.  Possible head injury on blood thinners.  CT head and cervical spine imaging ordered, chest imaging ordered, lumbar imaging ordered  Imaging is quite reassuring, patient is relieved, she feels well and is anxious to leave, no indication for further testing at this time, return precautions discussed.        FINAL CLINICAL IMPRESSION(S) / ED DIAGNOSES   Final diagnoses:  Fall, initial encounter  Chest wall contusion, right, initial encounter  Injury of head, initial encounter     Rx / DC Orders   ED Discharge Orders     None        Note:  This document was prepared using Dragon voice recognition software and may include unintentional dictation errors.   Jene Every, MD 08/12/22 1524

## 2022-08-12 NOTE — ED Triage Notes (Addendum)
Pt states she is on warfarin and fell and hit her head. Pt denies loss of consciousness. Pt reports hitting her head on cement. Pt reports right rib pain and neck pain, right elbow and right knee pain and right back pain Pt reports she had a headache earlier, but it has since resolved, but has some nausea.   Mechanical fall  Blue, green, purple top sent to lab

## 2022-09-04 ENCOUNTER — Other Ambulatory Visit: Payer: Self-pay | Admitting: Family Medicine

## 2022-09-04 DIAGNOSIS — Z1231 Encounter for screening mammogram for malignant neoplasm of breast: Secondary | ICD-10-CM

## 2023-03-28 NOTE — Progress Notes (Unsigned)
Cardiology Office Note  Date:  03/29/2023   ID:  Nancy [name], DOB June 20, 1960, MRN [id number]  PCP:  Hillery Aldo, MD   Chief Complaint  Patient presents with   New Patient (Initial Visit)    Ref by Hillery Aldo, MD to establish care for a history of aortic valve replacement. Patient was seen by Dr. Gwen Pounds but her insurance has changed. Patient c/o bilateral leg pain at night. Medications reviewed by the patient verbally.     HPI:  Nancy [name] is a 62 year old woman with past medical history of Diabetes type 2 Hypertension Aortic valve stenosis, s/p AVR mechanical valve [facility] Nonobstructive coronary disease by catheterization September 2023 Diastolic CHF, ejection fraction 55 to 60% Who presents for new patient evaluation of her aortic valve stenosis, status post AVR mechanical valve September 2023  Previously followed by [facility] cardiology  aortic valve replacement on 01/08/2022 with [facility] Jude mechanical aortic valve. Patient had postoperative atrial fibrillation managed with amiodarone, now off of amiodarone   No recent echo available last dated January 06, 2022 Normal LV function, severe LVH Severe AS with mean gradient 89 mmHg  Seen in the emergency room August 12, 2022 after a fall  Has her lab work done by primary care A1C 8 No lipid panel available  Reports doing well on warfarin, recent INR 2.3 from what she remembers No complications from anticoagulation  PMH:   has a past medical history of Anxiety, Arthritis, Asthma, Depression, Diabetes mellitus without complication (HCC), GERD (gastroesophageal reflux disease), Heart murmur, History of kidney stones, Hypertension, Myocardial infarction (HCC) (2015), Pneumonia (2014), Seizures (HCC), and Severe aortic stenosis.  PSH:    Past Surgical History:  Procedure Laterality Date   CHOLECYSTECTOMY     HEMORRHOID SURGERY N/A 10/07/2017   Procedure: HEMORRHOIDECTOMY;  Surgeon: Carolan Shiver, MD;   Location: [facility] ORS;  Service: General;  Laterality: N/A;   RIGHT/LEFT HEART CATH AND CORONARY ANGIOGRAPHY N/A 01/05/2022   Procedure: RIGHT/LEFT HEART CATH AND CORONARY ANGIOGRAPHY;  Surgeon: Lamar Blinks, MD;  Location: [facility] INVASIVE CV LAB;  Service: Cardiovascular;  Laterality: N/A;   TUBAL LIGATION  1987    Current Outpatient Medications  Medication Sig Dispense Refill   acetaminophen-codeine (TYLENOL #3) 300-30 MG tablet Take 2 tablets by mouth at bedtime.  0   albuterol (PROVENTIL HFA;VENTOLIN HFA) 108 (90 Base) MCG/ACT inhaler Inhale 2 puffs into the lungs every 6 (six) hours as needed for wheezing or shortness of breath.     aspirin EC 81 MG tablet Take 81 mg by mouth daily.     CINNAMON PO Take 2 capsules by mouth 2 (two) times daily.     diclofenac sodium (VOLTAREN) 1 % GEL Apply 1 application topically daily as needed (for pain).     fluconazole (DIFLUCAN) 150 MG tablet Take 150 mg by mouth once.     furosemide (LASIX) 40 MG tablet Take 40 mg by mouth daily as needed.     GARCINIA CAMBOGIA-CHROMIUM PO Take 2 capsules by mouth daily.     JARDIANCE 25 MG TABS tablet Take 25 mg by mouth daily.     losartan (COZAAR) 25 MG tablet Take 1 tablet (25 mg total) by mouth daily.     metoprolol tartrate (LOPRESSOR) 25 MG tablet Take 25 mg by mouth 2 (two) times daily.     montelukast (SINGULAIR) 10 MG tablet Take 10 mg by mouth daily.     mupirocin ointment (BACTROBAN) 2 % Apply 1 Application topically 3 (  three) times daily.     nystatin cream (MYCOSTATIN) Apply topically.     OZEMPIC, 2 MG/DOSE, 8 MG/3ML SOPN Inject into the skin.     pregabalin (LYRICA) 50 MG capsule Take 50 mg by mouth daily.     Probiotic Product (PROBIOTIC PO) Take 1 capsule by mouth daily.     SILVADENE 1 % cream SMARTSIG:sparingly Topical Twice Daily PRN     SYMBICORT 160-4.5 MCG/ACT inhaler Inhale 2 puffs into the lungs 2 (two) times daily as needed.  5   TURMERIC PO Take 1 capsule by mouth daily.     warfarin  (COUMADIN) 4 MG tablet Take 4 mg by mouth one time only at 4 PM.     gabapentin (NEURONTIN) 300 MG capsule Take 300 mg by mouth at bedtime. (Patient not taking: Reported on 03/29/2023)  3   gabapentin (NEURONTIN) 300 MG capsule Take 1 capsule (300 mg total) by mouth 3 (three) times daily for 21 days. (Patient not taking: Reported on 03/29/2023) 63 capsule 0   LANTUS SOLOSTAR 100 UNIT/ML Solostar Pen Inject 38 Units into the skin at bedtime. (Patient not taking: Reported on 03/29/2023)  3   traZODone (DESYREL) 50 MG tablet Take 0.5 tablets (25 mg total) by mouth at bedtime as needed for sleep. (Patient not taking: Reported on 03/29/2023) 30 tablet 0   No current facility-administered medications for this visit.     Allergies:   Liraglutide, Metformin, Ibuprofen, Lisinopril, Dulaglutide, Insulins, and Levemir [insulin detemir]   Social History:  The patient  reports that she has quit smoking. Her smoking use included cigarettes. She has a 3.3 pack-year smoking history. She has never used smokeless tobacco. She reports current alcohol use. She reports current drug use. Drug: Marijuana.   Family History:   family history includes Breast cancer (age of onset: 76) in her maternal aunt.    Review of Systems: Review of Systems  Constitutional: Negative.   HENT: Negative.    Respiratory: Negative.    Cardiovascular: Negative.   Gastrointestinal: Negative.   Musculoskeletal: Negative.   Neurological: Negative.   Psychiatric/Behavioral: Negative.    All other systems reviewed and are negative.    PHYSICAL EXAM: VS:  BP (!) 150/80 (BP Location: Right Arm, Patient Position: Sitting, Cuff Size: Normal)   Pulse 93   Ht 5' 3.5" (1.613 m)   Wt 186 lb 8 oz (84.6 kg)   LMP 09/14/2007 (Approximate)   SpO2 97%   BMI 32.52 kg/m  , BMI Body mass index is 32.52 kg/m. GEN: Well nourished, well developed, in no acute distress HEENT: normal Neck: no JVD, carotid bruits, or masses Cardiac: RRR; no  murmurs, rubs, or gallops,no edema  Respiratory:  clear to auscultation bilaterally, normal work of breathing GI: soft, nontender, nondistended, + BS MS: no deformity or atrophy Skin: warm and dry, no rash Neuro:  Strength and sensation are intact Psych: euthymic mood, full affect   Recent Labs: No results found for requested labs within last 365 days.    Lipid Panel No results found for: "CHOL", "HDL", "LDLCALC", "TRIG"    Wt Readings from Last 3 Encounters:  03/29/23 186 lb 8 oz (84.6 kg)  08/12/22 178 lb (80.7 kg)  05/26/22 190 lb 12.8 oz (86.5 kg)       ASSESSMENT AND PLAN:  Problem List Items Addressed This Visit       Cardiology Problems   Essential hypertension   Relevant Medications   warfarin (COUMADIN) 4 MG tablet  metoprolol tartrate (LOPRESSOR) 25 MG tablet   furosemide (LASIX) 40 MG tablet   losartan (COZAAR) 25 MG tablet   Other Relevant Orders   EKG 12-Lead (Completed)     Other   S/P AVR (aortic valve replacement) - Primary   Relevant Orders   EKG 12-Lead (Completed)   Type 2 diabetes mellitus with peripheral neuropathy (HCC)   Relevant Medications   pregabalin (LYRICA) 50 MG capsule   losartan (COZAAR) 25 MG tablet   Other Visit Diagnoses     Severe aortic valve stenosis       Relevant Medications   warfarin (COUMADIN) 4 MG tablet   metoprolol tartrate (LOPRESSOR) 25 MG tablet   furosemide (LASIX) 40 MG tablet   losartan (COZAAR) 25 MG tablet   Other Relevant Orders   EKG 12-Lead (Completed)   Coronary artery disease of native artery of native heart with stable angina pectoris (HCC)       Relevant Medications   pregabalin (LYRICA) 50 MG capsule   warfarin (COUMADIN) 4 MG tablet   metoprolol tartrate (LOPRESSOR) 25 MG tablet   furosemide (LASIX) 40 MG tablet   losartan (COZAAR) 25 MG tablet   Other Relevant Orders   EKG 12-Lead (Completed)      Essential hypertension Blood pressure running high today, recently started on  losartan 25 daily, continues on metoprolol tartrate 25 twice daily Recommend she closely monitor blood pressure at home and write down numbers, she has follow-up appointment with primary care December 12 Metoprolol tartrate could be increased up to 50, losartan could be increased up to 50  Aortic valve stenosis, status post AVR mechanical valve Baseline echo ordered given none done after surgery On warfarin and aspirin 81 daily Takes Lasix as needed for any fluid retention  Diabetes type 2 A1c over 8 per her report Managed by primary care, on insulin, Jardiance, Ozempic  Outside records requested and reviewed in detail including prior surgical reports, outpatient cardiology   Signed, Dossie Arbour, M.D., Ph.D. [facility] [place], [place] [phone]

## 2023-03-29 ENCOUNTER — Ambulatory Visit: Payer: Medicaid Other | Attending: Cardiovascular Disease | Admitting: Cardiovascular Disease

## 2023-03-29 ENCOUNTER — Encounter: Payer: Self-pay | Admitting: Cardiovascular Disease

## 2023-03-29 VITALS — BP 150/80 | HR 93 | Ht 63.5 in | Wt 186.5 lb

## 2023-03-29 DIAGNOSIS — E1142 Type 2 diabetes mellitus with diabetic polyneuropathy: Secondary | ICD-10-CM

## 2023-03-29 DIAGNOSIS — I350 Nonrheumatic aortic (valve) stenosis: Secondary | ICD-10-CM | POA: Diagnosis not present

## 2023-03-29 DIAGNOSIS — Z952 Presence of prosthetic heart valve: Secondary | ICD-10-CM

## 2023-03-29 DIAGNOSIS — I25118 Atherosclerotic heart disease of native coronary artery with other forms of angina pectoris: Secondary | ICD-10-CM

## 2023-03-29 DIAGNOSIS — I10 Essential (primary) hypertension: Secondary | ICD-10-CM

## 2023-03-29 NOTE — Patient Instructions (Addendum)
Please monitor blood pressure    Medication Instructions:  No changes  If you need a refill on your cardiac medications before your next appointment, please call your pharmacy.   Lab work: No new labs needed  Testing/Procedures: Your physician has requested that you have an echocardiogram. Echocardiography is a painless test that uses sound waves to create images of your heart. It provides your doctor with information about the size and shape of your heart and how well your heart's chambers and valves are working.   You may receive an ultrasound enhancing agent through an IV if needed to better visualize your heart during the echo. This procedure takes approximately one hour.  There are no restrictions for this procedure.  This will take place at [address] [facility], [place] [address]  Please note: We ask at that you not bring children with you during ultrasound (echo/ vascular) testing. Due to room size and safety concerns, children are not allowed in the ultrasound rooms during exams. Our front office staff cannot provide observation of children in our lobby area while testing is being conducted. An adult accompanying a patient to their appointment will only be allowed in the ultrasound room at the discretion of the ultrasound technician under special circumstances. We apologize for any inconvenience.   Follow-Up: At [facility], you and your health needs are our priority.  As part of our continuing mission to provide you with exceptional heart care, we have created designated Provider Care Teams.  These Care Teams include your primary Cardiologist (physician) and Advanced Practice Providers (APPs -  Physician Assistants and Nurse Practitioners) who all work together to provide you with the care you need, when you need it.  You will need a follow up appointment in 6 months  Providers on your designated Care Team:   Nicolasa Ducking, NP Eula Listen,  PA-C Cadence Fransico Michael, [place]  COVID-19 Vaccine Information can be found at: [email or web address] For questions related to vaccine distribution or appointments, please email [email or web address] or call [phone].

## 2023-03-31 ENCOUNTER — Ambulatory Visit
Admission: RE | Admit: 2023-03-31 | Discharge: 2023-03-31 | Disposition: A | Discharge status: Home / Self Care | Payer: Medicaid Other | Source: Ambulatory Visit | Attending: Family Medicine | Admitting: Family Medicine

## 2023-03-31 DIAGNOSIS — Z1231 Encounter for screening mammogram for malignant neoplasm of breast: Secondary | ICD-10-CM | POA: Insufficient documentation

## 2023-04-07 ENCOUNTER — Telehealth: Payer: Self-pay | Admitting: Cardiovascular Disease

## 2023-04-07 NOTE — Telephone Encounter (Signed)
Clydie Braun the RN was wanting the appt notes sent over to [phone]. Please advise

## 2023-04-15 ENCOUNTER — Ambulatory Visit: Payer: Medicaid Other | Attending: Cardiovascular Disease

## 2023-04-15 DIAGNOSIS — Z952 Presence of prosthetic heart valve: Secondary | ICD-10-CM | POA: Diagnosis not present

## 2023-04-16 LAB — ECHOCARDIOGRAM COMPLETE
AV Mean grad: 10 mm[Hg]
AV Peak grad: 17.8 mm[Hg]
Ao pk vel: 2.11 m/s
Area-P 1/2: 4.15 cm2
S' Lateral: 2.9 cm

## 2023-04-19 ENCOUNTER — Encounter: Payer: Self-pay | Admitting: Emergency Medicine

## 2023-05-24 ENCOUNTER — Telehealth: Payer: Self-pay | Admitting: Cardiovascular Disease

## 2023-05-24 NOTE — Telephone Encounter (Signed)
Patient called and wanted to know if Dr. Mariah Milling could prescribe her amoxicillin because she needs it as an antibiotic for teeth cleaning

## 2023-05-25 MED ORDER — AMOXICILLIN 500 MG PO TABS: 2000.0000 mg | ORAL_TABLET | Freq: Once | ORAL | 1 refills | Status: AC

## 2023-05-25 NOTE — Telephone Encounter (Signed)
Patient is following up. She wanted to inform RN that her PCP went ahead and sent the prescription to her pharmacy so she no longer needs it--FYI.

## 2023-05-25 NOTE — Telephone Encounter (Signed)
Called patient and left message for call back.

## 2023-08-05 ENCOUNTER — Ambulatory Visit
Admission: EM | Admit: 2023-08-05 | Discharge: 2023-08-05 | Disposition: A | Discharge status: Home / Self Care | Attending: Family Medicine | Admitting: Family Medicine

## 2023-08-05 ENCOUNTER — Ambulatory Visit
Admission: RE | Admit: 2023-08-05 | Discharge: 2023-08-05 | Disposition: A | Discharge status: Home / Self Care | Source: Ambulatory Visit | Attending: Family Medicine | Admitting: Family Medicine

## 2023-08-05 ENCOUNTER — Encounter: Payer: Self-pay | Admitting: Emergency Medicine

## 2023-08-05 ENCOUNTER — Ambulatory Visit (INDEPENDENT_AMBULATORY_CARE_PROVIDER_SITE_OTHER)

## 2023-08-05 DIAGNOSIS — M79661 Pain in right lower leg: Secondary | ICD-10-CM | POA: Diagnosis present

## 2023-08-05 DIAGNOSIS — M25571 Pain in right ankle and joints of right foot: Secondary | ICD-10-CM | POA: Diagnosis not present

## 2023-08-05 NOTE — ED Triage Notes (Signed)
 Pt presents with right foot pain, swelling and redness x 1 week. Pt states she stepped out of bed and heard a pop. She also has calf pain and reports being in a car for 20 hrs 3 days after the injury.

## 2023-08-05 NOTE — Discharge Instructions (Addendum)
 Go to the medical mall to have an ultrasound performed. Look for signs at [facility] for the [facility]. Enter through the medical mall and go to the radiology department for your ultrasound.   Your foot and ankle x-rays are normal.  There were no broken or dislocated bones.

## 2023-08-05 NOTE — ED Provider Notes (Signed)
 [facility]    CSN: [id number] Arrival date & time: 08/05/23  1406      History   Chief Complaint Chief Complaint  Patient presents with   Foot Pain    HPI  HPI Nancy [name] is a 63 y.o. female.   Nancy [name] presents for right foot pain. She stubbed her foot on a box in the middle of night.  Felt like she had charley horse in her calf then she heard a loud "POW" after stepping. Had some calf pain.  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 She had her blood thinners increased a week or two ago.  Takes Warafin (valve surgery). On TTS she takes 6 mg every other day takes 4 mg. Has hisory of blood clots in her left arm.  Has known broken 3rd toe on the same foot.   Been taking tylenol 3 for chronic pain and applying a pain relieving rub on her foot and ankle. Has throbbing and aching nerve pain all the time.     Past Medical History:  Diagnosis Date   Anxiety    Arthritis    Asthma    Depression    Diabetes mellitus without complication (HCC)    GERD (gastroesophageal reflux disease)    no meds   Heart murmur    History of kidney stones    h/o   Hypertension    Myocardial infarction (HCC) 2015   Pneumonia 2014   Seizures (HCC)    as a child-no since age 78   Severe aortic stenosis     Patient Active Problem List   Diagnosis Date Noted   S/P AVR (aortic valve replacement) 01/08/2022   (HFpEF) heart failure with preserved ejection fraction (HCC) 01/04/2022   Elevated troponin I level 01/03/2022   Type 2 diabetes mellitus with peripheral neuropathy (HCC) 01/03/2022   Essential hypertension 01/03/2022   Acute respiratory failure with hypoxia (HCC)    Acute CHF (congestive heart failure) (HCC) 01/02/2022   Glaucoma 01/25/2017   Diverticulosis 01/25/2017   Peripheral nerve disease 05/26/2016    Past Surgical History:  Procedure Laterality Date   CHOLECYSTECTOMY     HEMORRHOID SURGERY N/A 10/07/2017    Procedure: HEMORRHOIDECTOMY;  Surgeon: Eldred Grego, MD;  Location: [facility] ORS;  Service: General;  Laterality: N/A;   RIGHT/LEFT HEART CATH AND CORONARY ANGIOGRAPHY N/A 01/05/2022   Procedure: RIGHT/LEFT HEART CATH AND CORONARY ANGIOGRAPHY;  Surgeon: Michelle Aid, MD;  Location: [facility] INVASIVE CV LAB;  Service: Cardiovascular;  Laterality: N/A;   TUBAL LIGATION  1987    OB History   No obstetric history on file.      Home Medications    Prior to Admission medications   Medication Sig Start Date End Date Taking? Authorizing Provider  acetaminophen-codeine (TYLENOL #3) 300-30 MG tablet Take 2 tablets by mouth at bedtime. 08/27/17   [provider]  albuterol (PROVENTIL HFA;VENTOLIN HFA) 108 (90 Base) MCG/ACT inhaler Inhale 2 puffs into the lungs every 6 (six) hours as needed for wheezing or shortness of breath.    [provider]  aspirin EC 81 MG tablet Take 81 mg by mouth daily.    [provider]  CINNAMON PO Take 2 capsules by mouth 2 (two) times daily.    [provider]  diclofenac sodium (VOLTAREN) 1 % GEL Apply 1 application topically daily as needed (for pain).    [provider]  fluconazole (DIFLUCAN) 150 MG tablet Take 150 mg by mouth once. 03/15/23   [provider]  furosemide (LASIX) 40 MG tablet Take 40 mg by mouth daily as needed.    [provider]  gabapentin (NEURONTIN) 300 MG capsule Take 300 mg by mouth at bedtime. Patient not taking: Reported on 03/29/2023 08/29/17   [provider]  gabapentin (NEURONTIN) 300 MG capsule Take 1 capsule (300 mg total) by mouth 3 (three) times daily for 21 days. Patient not taking: Reported on 03/29/2023 10/07/17 10/28/17  Eldred Grego, MD  GARCINIA CAMBOGIA-CHROMIUM PO Take 2 capsules by mouth daily.    [provider]  JARDIANCE 25 MG TABS tablet Take 25 mg by mouth daily. 12/22/21   [provider]  LANTUS SOLOSTAR 100 UNIT/ML  Solostar Pen Inject 38 Units into the skin at bedtime. Patient not taking: Reported on 03/29/2023 09/05/17   [provider]  losartan (COZAAR) 25 MG tablet Take 1 tablet (25 mg total) by mouth daily. 03/29/23   Gollan, Timothy J, MD  metoprolol tartrate (LOPRESSOR) 25 MG tablet Take 25 mg by mouth 2 (two) times daily.    [provider]  montelukast (SINGULAIR) 10 MG tablet Take 10 mg by mouth daily. 10/06/21   [provider]  mupirocin ointment (BACTROBAN) 2 % Apply 1 Application topically 3 (three) times daily. 03/15/23   [provider]  nystatin cream (MYCOSTATIN) Apply topically. 12/25/21   [provider]  OZEMPIC, 2 MG/DOSE, 8 MG/3ML SOPN Inject into the skin. 12/22/21   [provider]  pregabalin (LYRICA) 50 MG capsule Take 50 mg by mouth daily. 03/17/23   [provider]  Probiotic Product (PROBIOTIC PO) Take 1 capsule by mouth daily.    [provider]  SILVADENE 1 % cream SMARTSIG:sparingly Topical Twice Daily PRN 12/25/21   [provider]  SYMBICORT 160-4.5 MCG/ACT inhaler Inhale 2 puffs into the lungs 2 (two) times daily as needed. 09/05/17   [provider]  traZODone (DESYREL) 50 MG tablet Take 0.5 tablets (25 mg total) by mouth at bedtime as needed for sleep. Patient not taking: Reported on 03/29/2023 01/05/22   Davida Espy, MD  TURMERIC PO Take 1 capsule by mouth daily.    [provider]  warfarin (COUMADIN) 4 MG tablet Take 4 mg by mouth one time only at 4 PM.    [provider]    Family History Family History  Problem Relation Age of Onset   Breast cancer Maternal Aunt 72    Social History Social History   Tobacco Use   Smoking status: Former    Current packs/day: 0.25    Average packs/day: 0.3 packs/day for 13.0 years (3.3 ttl pk-yrs)    Types: Cigarettes   Smokeless tobacco: Never   Tobacco comments:    age [age]  Vaping Use   Vaping status: Never Used   Substance Use Topics   Alcohol use: Yes    Comment: occ   Drug use: Yes    Types: Marijuana     Allergies   Liraglutide, Metformin, Ibuprofen, Lisinopril, Dulaglutide, Insulins, Levemir [insulin detemir], Statins, and Ace inhibitors   Review of Systems Review of Systems: :negative unless otherwise stated in HPI.      Physical Exam Triage Vital Signs ED Triage Vitals  Encounter Vitals Group     BP 08/05/23 1428 (!) 147/91     Systolic BP Percentile --      Diastolic BP Percentile --  Pulse Rate 08/05/23 1428 97     Resp 08/05/23 1428 16     Temp 08/05/23 1428 98.5 F (36.9 C)     Temp Source 08/05/23 1428 Oral     SpO2 08/05/23 1428 94 %     Weight --      Height --      Head Circumference --      Peak Flow --      Pain Score 08/05/23 1426 9     Pain Loc --      Pain Education --      Exclude from Growth Chart --    No data found.  Updated Vital Signs BP (!) 147/91 (BP Location: Right Arm)   Pulse 97   Temp 98.5 F (36.9 C) (Oral)   Resp 16   LMP 09/14/2007 (Approximate)   SpO2 94%   Visual Acuity Right Eye Distance:   Left Eye Distance:   Bilateral Distance:    Right Eye Near:   Left Eye Near:    Bilateral Near:     Physical Exam GEN: well appearing female in no acute distress  CVS: well perfused  RESP: speaking in full sentences without pause, no respiratory distress  MSK:   Ankle/Foot, right: TTP noted at the bilateral malleoli, distal metatarsals of the 2nd and 3rd. +visible erythema, swelling, no toe bony deformities. No notable pes planus deformity. Transverse arch is not intact;  No evidence of tibiotalar deviation; Range of motion is good in all directions. Strength is 5/5 in all directions. No tenderness at the insertion/body/myotendinous junction of the Achilles tendon; pain elicited when squeezing the gastrocnemius, talar dome non-tender; Unremarkable calcaneal squeeze; No plantar calcaneal tenderness; No tenderness over the navicular  prominence or  over cuboid; No pain at base of 5th MT; Able to walk 4 steps but with pain.     UC Treatments / Results  Labs (all labs ordered are listed, but only abnormal results are displayed) Labs Reviewed - No data to display  EKG   Radiology US  Venous Img Lower Unilateral Right Result Date: 08/05/2023 CLINICAL DATA:  Right-sided calf pain. History of prior DVT. On warfarin EXAM: Right LOWER EXTREMITY VENOUS DOPPLER ULTRASOUND TECHNIQUE: Gray-scale sonography with compression, as well as color and duplex ultrasound, were performed to evaluate the deep venous system(s) from the level of the common femoral vein through the popliteal and proximal calf veins. COMPARISON:  None Available. FINDINGS: VENOUS Normal compressibility of the common femoral, superficial femoral, and popliteal veins, as well as the visualized calf veins. Visualized portions of profunda femoral vein and great saphenous vein unremarkable. No filling defects to suggest DVT on grayscale or color Doppler imaging. Doppler waveforms show normal direction of venous flow, normal respiratory plasticity and response to augmentation. Limited views of the contralateral common femoral vein are unremarkable. OTHER None. Limitations: none IMPRESSION: No evidence of right lower extremity DVT. Electronically Signed   By: Adrianna Horde M.D.   On: 08/05/2023 18:19   DG Ankle Complete Right Result Date: 08/05/2023 CLINICAL DATA:  pain.  Swelling and redness for 1 week. EXAM: RIGHT ANKLE - COMPLETE 3+ VIEW; RIGHT FOOT COMPLETE - 3+ VIEW COMPARISON:  None Available. FINDINGS: There is diffuse osteopenia of the visualized osseous structures. No acute fracture or dislocation. No aggressive osseous lesion. Probable old healed injury of proximal phalanx of second toe noted. There is subluxation of third metatarsophalangeal joint. Diffuse mild degenerative changes of imaged joints. Ankle mortise appears intact. Calcaneal spur noted along  the Achilles  tendon and Plantar aponeurosis attachment sites. No focal soft tissue swelling. No radiopaque foreign bodies. Extensive vascular calcifications noted. IMPRESSION: *No acute osseous abnormality of the right ankle or foot. Electronically Signed   By: Beula Brunswick M.D.   On: 08/05/2023 15:42   DG Foot Complete Right Result Date: 08/05/2023 CLINICAL DATA:  pain.  Swelling and redness for 1 week. EXAM: RIGHT ANKLE - COMPLETE 3+ VIEW; RIGHT FOOT COMPLETE - 3+ VIEW COMPARISON:  None Available. FINDINGS: There is diffuse osteopenia of the visualized osseous structures. No acute fracture or dislocation. No aggressive osseous lesion. Probable old healed injury of proximal phalanx of second toe noted. There is subluxation of third metatarsophalangeal joint. Diffuse mild degenerative changes of imaged joints. Ankle mortise appears intact. Calcaneal spur noted along the Achilles tendon and Plantar aponeurosis attachment sites. No focal soft tissue swelling. No radiopaque foreign bodies. Extensive vascular calcifications noted. IMPRESSION: *No acute osseous abnormality of the right ankle or foot. Electronically Signed   By: Beula Brunswick M.D.   On: 08/05/2023 15:42      Procedures Procedures (including critical care time)  Medications Ordered in UC Medications - No data to display  Initial Impression / Assessment and Plan / UC Course  I have reviewed the triage vital signs and the nursing notes.  Pertinent labs & imaging results that were available during my care of the patient were reviewed by me and considered in my medical decision making (see chart for details).      Pt is a 63 y.o.  female with one week of right foot pain after injuring her toe while getting out of bed. On exam, pt has tenderness of her right foot and ankle concerning for possible bony abnormality.   Obtained right foot and ankle plain films.  Personally interpreted by me were unremarkable for fracture or dislocation. Radiologist  report reviewed.  Given her history recent travel and calf pain recommended stat DVT ultrasound and she is agreeable.  Patient is scheduled for DVT ultrasound at [facility] medical mall.  I will call her with results.  She was placed in a right ankle brace for comfort.  Patient to gradually return to normal activities, as tolerated and continue ordinary activities within the limits permitted by pain. Tylenol PRN. Advised patient to avoid OTC NSAIDs while taking warfarin. Counseled patient on red flag symptoms and when to seek immediate care.  Patient has no chest pain or shortness of breath.  Patient to follow up with orthopedic provider, if symptoms do not improve with conservative treatment.  Return and ED precautions given. Understanding voiced. Discussed MDM, treatment plan and plan for follow-up with patient who agrees with plan.   Stat DVT ultrasound reviewed.  Patient called and updated with negative result.  Final Clinical Impressions(s) / UC Diagnoses   Final diagnoses:  Pain in joint involving right ankle and foot  Right calf pain     Discharge Instructions      Go to the medical mall to have an ultrasound performed. Look for signs at [facility] for the [facility]. Enter through the medical mall and go to the radiology department for your ultrasound.   Your foot and ankle x-rays are normal.  There were no broken or dislocated bones.       ED Prescriptions   None    PDMP not reviewed this encounter.   Edell Mesenbrink, DO 08/12/23 [phone]

## 2023-08-07 ENCOUNTER — Telehealth (HOSPITAL_BASED_OUTPATIENT_CLINIC_OR_DEPARTMENT_OTHER): Payer: Self-pay | Admitting: Cardiology

## 2023-08-07 NOTE — Telephone Encounter (Signed)
 Paged directly to patient's phone number, presumably from answering service. Patient reports that she was seen in urgent care 2 days ago for right foot pain. She has noticed some swelling and pain when she walks. She is requesting antibiotics. I discussed with her that I cannot examine her and determine if she needs antibiotics. She denies current fevers/chills. Notes that swelling is better with elevation and ibuprofen. I discussed that if symptoms are worse, then presenting to urgent care for re-evaluation is recommended. She declined to go to urgent care/ER and would rather wait until Monday 4/12 to see her PCP in the office. Given instructions on what to watch for and that if symptoms worsen, she should present to ER/urgent in the interim. Will forward to Dr. Gollan per her request.  Sheryle Donning, MD, PhD, [facility]  [facility]  [place]  Heart & Vascular at [facility] [address], [address] [place], [place] [address] [phone]

## 2023-11-09 NOTE — Congregational Nurse Program (Signed)
  Dept: [phone]   Congregational Nurse Program Note  Date of Encounter: 11/09/2023  Past Medical History: Past Medical History:  Diagnosis Date   Anxiety    Arthritis    Asthma    Depression    Diabetes mellitus without complication (HCC)    GERD (gastroesophageal reflux disease)    no meds   Heart murmur    History of kidney stones    h/o   Hypertension    Myocardial infarction [facility]) 2015   Pneumonia 2014   Seizures (HCC)    as a child-no since age 61   Severe aortic stenosis     Encounter Details:   There were no vitals filed for this visit. There is no height or weight on file to calculate BMI.   Patient has spider bite that has formed into a large abdomen wound.  Patient has been on antibiotics but is encouraged to go to ER or to urgent care.

## 2024-04-30 NOTE — Progress Notes (Unsigned)
 Cardiology Office Note  Date:  05/02/2024   ID:  Nancy [name], DOB 1961/04/14, MRN [id number]  PCP:  Tobie Domino, MD   Chief Complaint  Patient presents with   6 month follow up     Doing well.     HPI:  Nancy [name] is a 64 year old woman with past medical history of Diabetes type 2 Hypertension Aortic valve stenosis, s/p AVR mechanical valve [employer] 01/08/22 Nonobstructive coronary disease by catheterization September 2023 Diastolic CHF, ejection fraction 55 to 60% Who presents for f/u of her aortic valve stenosis, status post AVR mechanical valve September 2023  LOV 12/24  Reports feeling well Busy with her hobbies, making different kinds of wine Has been nude in her backyard, works with her husband  Dental appointment 05/10/23  Needs something cut out  Prior imaging reviewed Echo 12/24 Normal left and right ventricular size and function Mild to moderate mitral valve regurgitation Mechanical valve appears to be well-functioning  echo January 06, 2022 Normal LV function, severe LVH Severe AS with mean gradient 89 mmHg  aortic valve replacement on 01/08/2022 with [facility] Jude mechanical aortic valve.  postoperative atrial fibrillation managed with amiodarone, now off of amiodarone   No complications from anticoagulation/warfarin  EKG personally reviewed by myself on todays visit EKG Interpretation Date/Time:  Tuesday May 02 2024 11:40:49 EST Ventricular Rate:  79 PR Interval:  172 QRS Duration:  126 QT Interval:  458 QTC Calculation: 525 R Axis:   -52  Text Interpretation: Normal sinus rhythm Possible Left atrial enlargement Left axis deviation Left ventricular hypertrophy with QRS widening ( Cornell product ) Cannot rule out Septal infarct (cited on or before 29-Mar-2023) Inferior infarct (cited on or before 29-Mar-2023) When compared with ECG of 29-Mar-2023 09:45, No significant change was found Confirmed by Perla Lye [id number]) on 05/02/2024  12:03:39 PM    PMH:   has a past medical history of Anxiety, Arthritis, Asthma, Depression, Diabetes mellitus without complication (HCC), GERD (gastroesophageal reflux disease), Heart murmur, History of kidney stones, Hypertension, Myocardial infarction (HCC) (2015), Pneumonia (2014), Seizures (HCC), and Severe aortic stenosis.  PSH:    Past Surgical History:  Procedure Laterality Date   CHOLECYSTECTOMY     HEMORRHOID SURGERY N/A 10/07/2017   Procedure: HEMORRHOIDECTOMY;  Surgeon: Rodolph Romano, MD;  Location: [facility] ORS;  Service: General;  Laterality: N/A;   RIGHT/LEFT HEART CATH AND CORONARY ANGIOGRAPHY N/A 01/05/2022   Procedure: RIGHT/LEFT HEART CATH AND CORONARY ANGIOGRAPHY;  Surgeon: Hester Wolm PARAS, MD;  Location: [facility] INVASIVE CV LAB;  Service: Cardiovascular;  Laterality: N/A;   TUBAL LIGATION  1987    Current Outpatient Medications  Medication Sig Dispense Refill   acetaminophen -codeine  (TYLENOL  #3) 300-30 MG tablet Take 2 tablets by mouth at bedtime.  0   albuterol  (PROVENTIL  HFA;VENTOLIN  HFA) 108 (90 Base) MCG/ACT inhaler Inhale 2 puffs into the lungs every 6 (six) hours as needed for wheezing or shortness of breath.     aspirin  EC 81 MG tablet Take 81 mg by mouth daily. (Patient taking differently: Take 81 mg by mouth as needed.)     BREO ELLIPTA 100-25 MCG/ACT AEPB Inhale 1 puff into the lungs daily.     CINNAMON PO Take 2 capsules by mouth 2 (two) times daily.     diclofenac  sodium (VOLTAREN ) 1 % GEL Apply 1 application topically daily as needed (for pain).     fluconazole (DIFLUCAN) 150 MG tablet Take 150 mg by mouth once.     furosemide  (LASIX )  40 MG tablet Take 40 mg by mouth daily as needed.     GARCINIA CAMBOGIA-CHROMIUM PO Take 2 capsules by mouth daily.     insulin  glargine, 2 Unit Dial, (TOUJEO  MAX SOLOSTAR) 300 UNIT/ML Solostar Pen Inject 80 Units into the skin daily.     JARDIANCE  25 MG TABS tablet Take 25 mg by mouth daily.     losartan  (COZAAR ) 100 MG  tablet Take 100 mg by mouth daily.     metoprolol  succinate (TOPROL -XL) 100 MG 24 hr tablet Take 100 mg by mouth daily.     montelukast (SINGULAIR) 10 MG tablet Take 10 mg by mouth daily.     MOUNJARO 7.5 MG/0.5ML Pen Inject 7.5 mg into the skin once a week.     mupirocin ointment (BACTROBAN) 2 % Apply 1 Application topically 3 (three) times daily.     nystatin cream (MYCOSTATIN) Apply topically.     ondansetron  (ZOFRAN -ODT) 4 MG disintegrating tablet Take 4 mg by mouth every 8 (eight) hours as needed.     pantoprazole (PROTONIX) 40 MG tablet Take 40 mg by mouth daily.     pregabalin (LYRICA) 50 MG capsule Take 50 mg by mouth daily.     Probiotic Product (PROBIOTIC PO) Take 1 capsule by mouth daily.     SILVADENE 1 % cream SMARTSIG:sparingly Topical Twice Daily PRN     TURMERIC PO Take 1 capsule by mouth daily.     warfarin (COUMADIN) 4 MG tablet Take 4 mg by mouth one time only at 4 PM.     gabapentin  (NEURONTIN ) 300 MG capsule Take 300 mg by mouth at bedtime. (Patient not taking: Reported on 03/29/2023)  3   traZODone  (DESYREL ) 50 MG tablet Take 0.5 tablets (25 mg total) by mouth at bedtime as needed for sleep. (Patient not taking: Reported on 05/02/2024) 30 tablet 0   No current facility-administered medications for this visit.    Allergies:   Liraglutide, Metformin, Ibuprofen, Lisinopril, Dulaglutide, Insulins, Levemir [insulin  detemir], Statins, and Ace inhibitors   Social History:  The patient  reports that she has quit smoking. Her smoking use included cigarettes. She has a 3.3 pack-year smoking history. She has never used smokeless tobacco. She reports current alcohol use. She reports current drug use. Drug: Marijuana.   Family History:   family history includes Breast cancer (age of onset: 67) in her maternal aunt.    Review of Systems: Review of Systems  Constitutional: Negative.   HENT: Negative.    Respiratory: Negative.    Cardiovascular: Negative.   Gastrointestinal:  Negative.   Musculoskeletal: Negative.   Neurological: Negative.   Psychiatric/Behavioral: Negative.    All other systems reviewed and are negative.   PHYSICAL EXAM: VS:  BP (!) 140/64 (BP Location: Left Arm, Patient Position: Sitting, Cuff Size: Normal)   Pulse 79   Ht 5' 3.5 (1.613 m)   Wt 199 lb 8 oz (90.5 kg)   LMP 09/14/2007   SpO2 96%   BMI 34.79 kg/m  , BMI Body mass index is 34.79 kg/m. GEN: Well nourished, well developed, in no acute distress HEENT: normal Neck: no JVD, carotid bruits, or masses Cardiac: RRR; no murmurs, rubs, or gallops,no edema  Respiratory:  clear to auscultation bilaterally, normal work of breathing GI: soft, nontender, nondistended, + BS MS: no deformity or atrophy Skin: warm and dry, no rash Neuro:  Strength and sensation are intact Psych: euthymic mood, full affect   Recent Labs: No results found for requested labs within  last 365 days.    Lipid Panel No results found for: CHOL, HDL, LDLCALC, TRIG    Wt Readings from Last 3 Encounters:  05/02/24 199 lb 8 oz (90.5 kg)  03/29/23 186 lb 8 oz (84.6 kg)  08/12/22 178 lb (80.7 kg)     ASSESSMENT AND PLAN:  Problem List Items Addressed This Visit       Cardiology Problems   (HFpEF) heart failure with preserved ejection fraction (HCC) - Primary   Relevant Medications   metoprolol  succinate (TOPROL -XL) 100 MG 24 hr tablet   losartan  (COZAAR ) 100 MG tablet   Other Relevant Orders   EKG 12-Lead (Completed)   Essential hypertension   Relevant Medications   metoprolol  succinate (TOPROL -XL) 100 MG 24 hr tablet   losartan  (COZAAR ) 100 MG tablet   Other Relevant Orders   EKG 12-Lead (Completed)     Other   S/P AVR (aortic valve replacement)   Relevant Orders   EKG 12-Lead (Completed)   Type 2 diabetes mellitus with peripheral neuropathy (HCC)   Relevant Medications   MOUNJARO 7.5 MG/0.5ML Pen   insulin  glargine, 2 Unit Dial, (TOUJEO  MAX SOLOSTAR) 300 UNIT/ML Solostar Pen    losartan  (COZAAR ) 100 MG tablet    Essential hypertension Blood pressure is well controlled on today's visit. No changes made to the medications.  Aortic valve stenosis, status post AVR mechanical valve On warfarin and aspirin  81 daily Takes Lasix  as needed for any fluid retention - Echo 1 year ago showing stable aortic valve  Diabetes type 2 No recent A1c available for review Managed by primary care  on insulin , Jardiance , Ozempic    Signed, Velinda Lunger, M.D., Ph.D. [facility] [place], [place] [phone]

## 2024-05-02 ENCOUNTER — Encounter: Payer: Self-pay | Admitting: Cardiovascular Disease

## 2024-05-02 ENCOUNTER — Ambulatory Visit: Attending: Cardiovascular Disease | Admitting: Cardiovascular Disease

## 2024-05-02 VITALS — BP 140/64 | HR 79 | Ht 63.5 in | Wt 199.5 lb

## 2024-05-02 DIAGNOSIS — E1142 Type 2 diabetes mellitus with diabetic polyneuropathy: Secondary | ICD-10-CM

## 2024-05-02 DIAGNOSIS — Z952 Presence of prosthetic heart valve: Secondary | ICD-10-CM | POA: Diagnosis not present

## 2024-05-02 DIAGNOSIS — I5032 Chronic diastolic (congestive) heart failure: Secondary | ICD-10-CM

## 2024-05-02 DIAGNOSIS — I10 Essential (primary) hypertension: Secondary | ICD-10-CM

## 2024-05-02 MED ORDER — AMOXICILLIN 500 MG PO CAPS: 2000.0000 mg | ORAL_CAPSULE | Freq: Every day | ORAL | 5 refills | Status: AC | PRN

## 2024-05-02 MED ORDER — METOPROLOL SUCCINATE ER 100 MG PO TB24: 100.0000 mg | ORAL_TABLET | Freq: Every day | ORAL | 3 refills | Status: AC

## 2024-05-02 MED ORDER — LOSARTAN POTASSIUM 100 MG PO TABS: 100.0000 mg | ORAL_TABLET | Freq: Every day | ORAL | 3 refills | Status: AC

## 2024-05-02 NOTE — Patient Instructions (Signed)
